# Patient Record
Sex: Female | Born: 1980
Health system: Southern US, Community
[De-identification: ages and names within clinical notes are randomized; demographics above are authoritative.]

## PROBLEM LIST (undated history)

## (undated) DIAGNOSIS — F32A Depression, unspecified: Secondary | ICD-10-CM

## (undated) DIAGNOSIS — H8109 Meniere's disease, unspecified ear: Secondary | ICD-10-CM

## (undated) DIAGNOSIS — Z9889 Other specified postprocedural states: Secondary | ICD-10-CM

## (undated) DIAGNOSIS — M0800 Unspecified juvenile rheumatoid arthritis of unspecified site: Secondary | ICD-10-CM

## (undated) DIAGNOSIS — R0789 Other chest pain: Secondary | ICD-10-CM

## (undated) DIAGNOSIS — G43909 Migraine, unspecified, not intractable, without status migrainosus: Secondary | ICD-10-CM

## (undated) DIAGNOSIS — R112 Nausea with vomiting, unspecified: Secondary | ICD-10-CM

## (undated) DIAGNOSIS — I493 Ventricular premature depolarization: Secondary | ICD-10-CM

## (undated) DIAGNOSIS — F419 Anxiety disorder, unspecified: Secondary | ICD-10-CM

## (undated) DIAGNOSIS — G629 Polyneuropathy, unspecified: Secondary | ICD-10-CM

## (undated) DIAGNOSIS — R0609 Other forms of dyspnea: Secondary | ICD-10-CM

## (undated) DIAGNOSIS — M1990 Unspecified osteoarthritis, unspecified site: Secondary | ICD-10-CM

## (undated) DIAGNOSIS — F329 Major depressive disorder, single episode, unspecified: Secondary | ICD-10-CM

## (undated) DIAGNOSIS — R0600 Dyspnea, unspecified: Secondary | ICD-10-CM

## (undated) DIAGNOSIS — R002 Palpitations: Secondary | ICD-10-CM

## (undated) HISTORY — DX: Other chest pain: R07.89

## (undated) HISTORY — DX: Polyneuropathy, unspecified: G62.9

## (undated) HISTORY — DX: Dyspnea, unspecified: R06.00

## (undated) HISTORY — DX: Depression, unspecified: F32.A

## (undated) HISTORY — DX: Unspecified juvenile rheumatoid arthritis of unspecified site: M08.00

## (undated) HISTORY — DX: Palpitations: R00.2

## (undated) HISTORY — DX: Other forms of dyspnea: R06.09

## (undated) HISTORY — DX: Ventricular premature depolarization: I49.3

## (undated) HISTORY — PX: REFRACTIVE SURGERY: SHX103

## (undated) HISTORY — DX: Unspecified osteoarthritis, unspecified site: M19.90

## (undated) HISTORY — DX: Major depressive disorder, single episode, unspecified: F32.9

## (undated) HISTORY — PX: KNEE SURGERY: SHX244

---

## 2015-03-05 ENCOUNTER — Ambulatory Visit: Payer: Self-pay | Admitting: Physician Assistant

## 2015-03-05 ENCOUNTER — Encounter: Payer: Self-pay | Admitting: Physician Assistant

## 2015-03-05 ENCOUNTER — Ambulatory Visit: Payer: Self-pay | Admitting: Family

## 2015-03-05 VITALS — BP 110/80 | HR 60 | Temp 98.0°F

## 2015-03-05 DIAGNOSIS — J302 Other seasonal allergic rhinitis: Secondary | ICD-10-CM

## 2015-03-05 DIAGNOSIS — Z9109 Other allergy status, other than to drugs and biological substances: Secondary | ICD-10-CM

## 2015-03-05 DIAGNOSIS — J4520 Mild intermittent asthma, uncomplicated: Secondary | ICD-10-CM

## 2015-03-05 MED ORDER — ALBUTEROL SULFATE HFA 108 (90 BASE) MCG/ACT IN AERS: 1.0000 | INHALATION_SPRAY | Freq: Four times a day (QID) | RESPIRATORY_TRACT | Status: DC | PRN

## 2015-03-05 NOTE — Progress Notes (Signed)
S/ tightness of chest during jogging 3  Miles  over the past 3 months, had bronchitis a few months  Ago  And denies further sxs . H/o seasonal environmental  Allergies,  not taking anything O/ pleasant NAD VSS Ent with allergic changes  Heart rsr Lungs clear  A/ RAD exercise induced P/ Rx albuterol inhaler for pre exercise . Advised to take otc claritin and f/u prn not improving.  Web Md allergy app .

## 2015-07-14 ENCOUNTER — Ambulatory Visit: Payer: Self-pay | Admitting: Physician Assistant

## 2015-07-14 ENCOUNTER — Encounter: Payer: Self-pay | Admitting: Physician Assistant

## 2015-07-14 VITALS — BP 105/65 | HR 69 | Temp 98.3°F

## 2015-07-14 DIAGNOSIS — J018 Other acute sinusitis: Secondary | ICD-10-CM

## 2015-07-14 MED ORDER — AZITHROMYCIN 250 MG PO TABS: ORAL_TABLET | ORAL | Status: AC

## 2015-07-14 MED ORDER — FLUTICASONE PROPIONATE 50 MCG/ACT NA SUSP: 2.0000 | Freq: Every day | NASAL | Status: DC

## 2015-07-14 NOTE — Progress Notes (Signed)
S: C/o runny nose and congestion for 5 days, low grade fever,  No chills, cp/sob, v/d; mucus is green and thick, cough is sporadic, c/o of facial and dental pain.   Using otc meds: allergy med  O: PE: vitals wnl, nad, perrl eomi, normocephalic, tms dull, nasal mucosa red and swollen, throat injected, neck supple no lymph, lungs c t a, cv rrr, neuro intact  A:  Acute sinusitis   P: zpack, flonase; drink fluids, continue regular meds , use otc meds of choice, return if not improving in 5 days, return earlier if worsening

## 2015-07-16 ENCOUNTER — Emergency Department
Admission: EM | Admit: 2015-07-16 | Discharge: 2015-07-16 | Disposition: A | Discharge status: Home / Self Care | Payer: BLUE CROSS/BLUE SHIELD | Attending: Emergency Medicine | Admitting: Emergency Medicine

## 2015-07-16 ENCOUNTER — Encounter: Payer: Self-pay | Admitting: Emergency Medicine

## 2015-07-16 ENCOUNTER — Emergency Department: Payer: BLUE CROSS/BLUE SHIELD

## 2015-07-16 DIAGNOSIS — G43909 Migraine, unspecified, not intractable, without status migrainosus: Secondary | ICD-10-CM | POA: Diagnosis not present

## 2015-07-16 DIAGNOSIS — R251 Tremor, unspecified: Secondary | ICD-10-CM | POA: Diagnosis present

## 2015-07-16 DIAGNOSIS — T363X5A Adverse effect of macrolides, initial encounter: Secondary | ICD-10-CM | POA: Diagnosis not present

## 2015-07-16 DIAGNOSIS — G2571 Drug induced akathisia: Secondary | ICD-10-CM | POA: Diagnosis not present

## 2015-07-16 HISTORY — DX: Meniere's disease, unspecified ear: H81.09

## 2015-07-16 HISTORY — DX: Migraine, unspecified, not intractable, without status migrainosus: G43.909

## 2015-07-16 LAB — URINE DRUG SCREEN, QUALITATIVE (ARMC ONLY)
Amphetamines, Ur Screen: NOT DETECTED
BARBITURATES, UR SCREEN: NOT DETECTED
BENZODIAZEPINE, UR SCRN: NOT DETECTED
CANNABINOID 50 NG, UR ~~LOC~~: NOT DETECTED
COCAINE METABOLITE, UR ~~LOC~~: NOT DETECTED
MDMA (Ecstasy)Ur Screen: NOT DETECTED
METHADONE SCREEN, URINE: NOT DETECTED
OPIATE, UR SCREEN: NOT DETECTED
Phencyclidine (PCP) Ur S: NOT DETECTED
TRICYCLIC, UR SCREEN: NOT DETECTED

## 2015-07-16 LAB — COMPREHENSIVE METABOLIC PANEL
ALBUMIN: 4.6 g/dL (ref 3.5–5.0)
ALK PHOS: 56 U/L (ref 38–126)
ALT: 24 U/L (ref 14–54)
AST: 27 U/L (ref 15–41)
Anion gap: 5 (ref 5–15)
BUN: 20 mg/dL (ref 6–20)
CALCIUM: 9.3 mg/dL (ref 8.9–10.3)
CHLORIDE: 111 mmol/L (ref 101–111)
CO2: 24 mmol/L (ref 22–32)
CREATININE: 0.83 mg/dL (ref 0.44–1.00)
GFR calc Af Amer: >60 mL/min (ref >60)
GFR calc non Af Amer: >60 mL/min (ref >60)
GLUCOSE: 95 mg/dL (ref 65–99)
Potassium: 4.6 mmol/L (ref 3.5–5.1)
SODIUM: 140 mmol/L (ref 135–145)
Total Bilirubin: 0.2 mg/dL — ABNORMAL LOW (ref 0.3–1.2)
Total Protein: 7.7 g/dL (ref 6.5–8.1)

## 2015-07-16 LAB — URINALYSIS COMPLETE WITH MICROSCOPIC (ARMC ONLY)
BILIRUBIN URINE: NEGATIVE
GLUCOSE, UA: NEGATIVE mg/dL
Ketones, ur: NEGATIVE mg/dL
Nitrite: NEGATIVE
Protein, ur: NEGATIVE mg/dL
RBC / HPF: NONE SEEN RBC/hpf (ref 0–5)
Specific Gravity, Urine: 1.004 — ABNORMAL LOW (ref 1.005–1.030)
pH: 7 (ref 5.0–8.0)

## 2015-07-16 LAB — CBC WITH DIFFERENTIAL/PLATELET
BASOS PCT: 1 %
Basophils Absolute: 0 10*3/uL (ref 0–0.1)
EOS ABS: 0 10*3/uL (ref 0–0.7)
Eosinophils Relative: 1 %
HEMATOCRIT: 41.4 % (ref 35.0–47.0)
HEMOGLOBIN: 13.9 g/dL (ref 12.0–16.0)
LYMPHS ABS: 1.8 10*3/uL (ref 1.0–3.6)
Lymphocytes Relative: 23 %
MCH: 30.1 pg (ref 26.0–34.0)
MCHC: 33.6 g/dL (ref 32.0–36.0)
MCV: 89.6 fL (ref 80.0–100.0)
MONO ABS: 0.7 10*3/uL (ref 0.2–0.9)
MONOS PCT: 9 %
Neutro Abs: 5.2 10*3/uL (ref 1.4–6.5)
Neutrophils Relative %: 66 %
Platelets: 225 10*3/uL (ref 150–440)
RBC: 4.62 MIL/uL (ref 3.80–5.20)
RDW: 14 % (ref 11.5–14.5)
WBC: 7.9 10*3/uL (ref 3.6–11.0)

## 2015-07-16 LAB — TSH: TSH: 1.829 u[IU]/mL (ref 0.350–4.500)

## 2015-07-16 MED ORDER — DIPHENHYDRAMINE HCL 50 MG/ML IJ SOLN
12.5000 mg | Freq: Once | INTRAMUSCULAR | Status: AC
  Administered 2015-07-16: 12.5 mg via INTRAVENOUS

## 2015-07-16 MED ORDER — IBUPROFEN 800 MG PO TABS
ORAL_TABLET | ORAL | Status: AC
  Administered 2015-07-16: 800 mg via ORAL
  Filled 2015-07-16: qty 1

## 2015-07-16 MED ORDER — IBUPROFEN 800 MG PO TABS
800.0000 mg | ORAL_TABLET | Freq: Once | ORAL | Status: AC
  Administered 2015-07-16: 800 mg via ORAL

## 2015-07-16 MED ORDER — LORAZEPAM 2 MG/ML IJ SOLN
1.0000 mg | Freq: Once | INTRAMUSCULAR | Status: AC
  Administered 2015-07-16: 1 mg via INTRAVENOUS

## 2015-07-16 MED ORDER — LORAZEPAM 2 MG/ML IJ SOLN
0.5000 mg | Freq: Once | INTRAMUSCULAR | Status: AC
  Administered 2015-07-16: 0.5 mg via INTRAVENOUS

## 2015-07-16 MED ORDER — LORAZEPAM 2 MG/ML IJ SOLN
INTRAMUSCULAR | Status: AC
Start: 2015-07-16 — End: 2015-07-16
  Administered 2015-07-16: 0.5 mg via INTRAVENOUS
  Filled 2015-07-16: qty 1

## 2015-07-16 MED ORDER — LORAZEPAM 0.5 MG PO TABS: 0.5000 mg | ORAL_TABLET | Freq: Three times a day (TID) | ORAL | Status: AC | PRN

## 2015-07-16 MED ORDER — LORAZEPAM 2 MG/ML IJ SOLN
INTRAMUSCULAR | Status: AC
  Administered 2015-07-16: 1 mg via INTRAVENOUS
  Filled 2015-07-16: qty 1

## 2015-07-16 MED ORDER — DIPHENHYDRAMINE HCL 50 MG/ML IJ SOLN
INTRAMUSCULAR | Status: AC
  Administered 2015-07-16: 12.5 mg via INTRAVENOUS
  Filled 2015-07-16: qty 1

## 2015-07-16 NOTE — ED Notes (Signed)
Roshonna called back from Poison control center and states that she spoke with their MD and they can not find any link between azithromycin and patients symptom. Dr. Jacqualine Code informed.

## 2015-07-16 NOTE — Discharge Instructions (Signed)
Akathisia/Dystonia  No driving today or while using ativan. Follow up closely with neurology. Return to the emergency room if your symptoms recur, become severe, develop any numbness, weakness in the face arms or legs, severe headache, fever, or other new concerns arise.  Discontinue azithromycin, you may have an allergy to this medicine.  Dystonia is a condition that makes your muscles contract without warning (muscle spasms). It can make doing everyday tasks hard. There are different forms of dystonia. The condition can affect just one part of your body, or it can affect larger areas of your body. Dystonia affects people in different ways. In some people, it is mild and goes away over time, while others may need treatment. Although there is no cure for dystonia, you can manage the condition with treatment. CAUSES  Dystonia may be caused by:  Genetics. This means you inherited the genes that cause you to be at risk for dystonia.  An abnormality in the part of your brain that controls movement (basal ganglia). Dystonia may also be acquired. If you have acquired dystonia, you developed the condition after:  Brain injury.  Infection.  Drug reaction. The cause of dystonia may also not be known (idiopathic dystonia).  SIGNS AND SYMPTOMS Signs and symptoms of dystonia can depend on which type of the condition you have. Common signs and symptoms include:  Muscle twitches or spasms around your eyes (blepharospasm).  Foot cramping or dragging.  Pulling of your neck to one side (torticollis).  Muscles spasms of the face.  Spasms of the voice box.  Tremors.  Awkward and painful positions.  Muscle cramping after muscle use. DIAGNOSIS  Your health care provider can diagnose dystonia based on your symptoms and medical history. Your health care provider will also do a physical exam. You may also have:   A blood test to check for genes that cause dystonia.  Brain imaging tests to rule  out other causes of your symptoms. There are no tests that can diagnose other causes of dystonia. TREATMENT  There are no treatments that can cure or prevent dystonia. Treatment to manage dystonia may include:   Injecting the affected muscles with a chemical (botulinum) that blocks muscle spasms. This treatment can block spasms for a few days to a few months.  Medicines to relax muscles. HOME CARE INSTRUCTIONS  Physical therapy to improve muscle strength and movement may be suggested by your health care provider. Continue your physical therapy exercises at home as instructed by your physical therapist.  Make sure you have a good support system. Let your health care provider know if you are struggling with stress or anxiety.  Keep all follow-up visits as directed by your health care provider. This is important.  Take medicines only as directed by your health care provider. SEEK MEDICAL CARE IF:  Your condition is changing or getting worse.  You need more support at home.   This information is not intended to replace advice given to you by your health care provider. Make sure you discuss any questions you have with your health care provider.   Document Released: 03/10/2002 Document Revised: 04/10/2014 Document Reviewed: 05/14/2013 Elsevier Interactive Patient Education [employer].

## 2015-07-16 NOTE — ED Notes (Signed)
Patient presents to the ED with tremors, mostly on the right side of her body that began with mostly her hand and now includes her leg.  Patient is also complaining of migraine headache which she has a history of.  Patient also recently started taking a z-pack for a sinus infection.  Patient reports history of Meniere's disease as well.  Patient is alert and oriented at this time.

## 2015-07-16 NOTE — ED Provider Notes (Signed)
[facility] Emergency Department Provider Note  ____________________________________________  Time seen: Approximately 9:28 AM  I have reviewed the triage vital signs and the nursing notes.   HISTORY  Chief Complaint Tremors    HPI Sarah Gallagher is a 35 y.o. female previous history of Mnire's, migraines, and recent 3 day treatment with azithromycin for a "sinus infection" which is getting better.  Patient reports that this morning about 4 morning she woke up and started feeling slightly tremulous in the right arm. This tremor progressed and she being tremulous in the right arm, right leg, left arm and also occasionally in the neck. She reports she just feels shaky as though she has a "Parkinson's" symptoms. She does not of course carry a history of Parkinson's. No previous history of tremor.  No pain. No numbness tingling or weakness in the arm or leg. No trouble speaking. No facial droop.  She felt fine when she went to bed last night. Tremor first noted when she woke up around 4 in the morning.  Past Medical History  Diagnosis Date  . Meniere disease   . Migraines     There are no active problems to display for this patient.   Past Surgical History  Procedure Laterality Date  . Knee surgery      Current Outpatient Rx  Name  Route  Sig  Dispense  Refill  . albuterol (PROVENTIL HFA;VENTOLIN HFA) 108 (90 BASE) MCG/ACT inhaler   Inhalation   Inhale 1-2 puffs into the lungs every 6 (six) hours as needed for wheezing or shortness of breath.   1 Inhaler   2   . azithromycin (ZITHROMAX Z-PAK) 250 MG tablet      2 pills today then 1 pill a day for 4 days   6 each   0   . fluticasone (FLONASE) 50 MCG/ACT nasal spray   Each Nare   Place 2 sprays into both nostrils daily.   16 g   6   . medroxyPROGESTERone (DEPO-PROVERA) 150 MG/ML injection   Intramuscular   Inject 150 mg into the muscle every 3 (three) months.         Marland Kitchen LORazepam  (ATIVAN) 0.5 MG tablet   Oral   Take 1 tablet (0.5 mg total) by mouth every 8 (eight) hours as needed (tremor).   16 tablet   0     Allergies Review of patient's allergies indicates no known allergies.  No family history on file.  Social History Social History  Substance Use Topics  . Smoking status: Never Smoker   . Smokeless tobacco: None  . Alcohol Use: 0.0 oz/week    0 Standard drinks or equivalent per week     Comment: occasionally    Review of Systems Constitutional: No fever/chills, though did have a recent sinus infection which is improving Eyes: No visual changes. ENT: No sore throat. Cardiovascular: Denies chest pain. Respiratory: Denies shortness of breath. Gastrointestinal: No abdominal pain.  No nausea, no vomiting.  No diarrhea.  No constipation. Genitourinary: Negative for dysuria. Musculoskeletal: Negative for back pain. Skin: Negative for rash. Neurological: Negative for headaches, focal weakness or numbness.  10-point ROS otherwise negative.  ____________________________________________   PHYSICAL EXAM:  VITAL SIGNS: ED Triage Vitals  Enc Vitals Group     BP 07/16/15 0806 133/88 mmHg     Pulse Rate 07/16/15 0806 80     Resp 07/16/15 0806 18     Temp 07/16/15 0806 98.1 F (36.7 C)  Temp Source 07/16/15 0806 Oral     SpO2 07/16/15 0806 100 %     Weight 07/16/15 0801 155 lb (70.308 kg)     Height 07/16/15 0801 5\' 5"  (1.651 m)     Head Cir --      Peak Flow --      Pain Score 07/16/15 0801 5     Pain Loc --      Pain Edu? --      Excl. in [place]? --    Constitutional: Alert and oriented. Well appearing and in no acute distress.Very pleasant. She is moving back and forth tremulous on the bed, somewhat difficult for her to control her tremor. Eyes: Conjunctivae are normal. PERRL. EOMI. Head: Atraumatic. Nose: No congestion/rhinnorhea. Mouth/Throat: Mucous membranes are moist.  Oropharynx non-erythematous. Neck: No stridor.  No  meningismus. Cardiovascular: Normal rate, regular rhythm. Grossly normal heart sounds.  Good peripheral circulation. Respiratory: Normal respiratory effort.  No retractions. Lungs CTAB. Gastrointestinal: Soft and nontender. No distention. No abdominal bruits. No CVA tenderness. Musculoskeletal: No lower extremity tenderness nor edema.  No joint effusions. Neurologic:  Normal speech and language. No gross focal neurologic deficits are appreciated.   NIH score equals 0, performed by me at bedside. The patient has no pronator drift. The patient has normal cranial nerve exam. Extraocular movements are normal. Visual fields are normal. Patient has 5 out of 5 strength in all extremities. There is no numbness or gross, acute sensory abnormality in the extremities bilaterally. No speech disturbance. No dysarthria. No aphasia. No ataxia except she does have bilateral tremulousness with hand movement. Normal finger nose finger bilat. Patient speaking in full and clear sentences.  The patient does have some slight writhing, tremulous movements. He is to be somewhat like akathisia's.   Skin:  Skin is warm, dry and intact. No rash noted. Psychiatric: Mood and affect are normal. Speech and behavior are normal.  ____________________________________________   LABS (all labs ordered are listed, but only abnormal results are displayed)  Labs Reviewed  COMPREHENSIVE METABOLIC PANEL - Abnormal; Notable for the following:    Total Bilirubin 0.2 (*)    All other components within normal limits  CBC WITH DIFFERENTIAL/PLATELET  TSH  URINE DRUG SCREEN, QUALITATIVE ([facility] ONLY)  URINALYSIS COMPLETEWITH MICROSCOPIC ([facility] ONLY)   ____________________________________________  EKG  ED ECG REPORT I, QUALE, MARK, the attending physician, personally viewed and interpreted this ECG.  Date: 07/16/2015 EKG Time: 833 Rate: 70 Rhythm: normal sinus rhythm QRS Axis: normal Intervals: normal ST/T Wave  abnormalities: normal Conduction Disturbances: none Narrative Interpretation: unremarkable, of particular relevance the QTC is normal  ____________________________________________  RADIOLOGY  CT Head Wo Contrast (Final result) Result time: 07/16/15 08:29:21   Final result by Rad Results In Interface (07/16/15 08:29:21)   Narrative:   CLINICAL DATA: Tremors and migraine  EXAM: CT HEAD WITHOUT CONTRAST  TECHNIQUE: Contiguous axial images were obtained from the base of the skull through the vertex without intravenous contrast.  COMPARISON: None.  FINDINGS: Skull and Sinuses:Negative for fracture or destructive process. The visualized mastoids, middle ears, and imaged paranasal sinuses are clear.  Visualized orbits: Negative.  Brain: No evidence of acute infarction, hemorrhage, hydrocephalus, or mass lesion/mass effect. Low-density below the right putamen is most compatible with dilated perivascular space.  IMPRESSION: Negative head CT.   Electronically Signed By: Monte Fantasia M.D. On: 07/16/2015 08:29       ____________________________________________   PROCEDURES  Procedure(s) performed: None  Critical Care performed: No  ____________________________________________  INITIAL IMPRESSION / ASSESSMENT AND PLAN / ED COURSE  Pertinent labs & imaging results that were available during my care of the patient were reviewed by me and considered in my medical decision making (see chart for details).  Patient presents with apparent akathisia's. No previous history of such. She does not exhibit any distress, but does have what is tremor and some writhing type movements.  She is neurologically intact without deficit. Head CT is normal. She has not high risk for stroke. In particular, in reviewing her medications she did recently start azithromycin and a possible rare side effect is akathisia. We will treat her with benzodiazepine and obtain neurology  consult for further input.  ----------------------------------------- 11:03 AM on 07/16/2015 -----------------------------------------  Sseen by neurology, significant agreement of the stove akathisia may be attributed to her azithromycin. I did discuss with poison control who advised that this is possible it is not of a recommended further workup which is been performed including CT that basic labs.  At the present time, she is resting comfortably in her tremors have disappeared. She has no ongoing tremor. She is awake alert in no distress with normal neurologic examination and alertness. We will treat her with as needed Ativan, discontinue azithromycin, provided careful return precautions as well as follow-up neurology. Patient very agreeable. Her husband is coming to pick her up to drive her home.   ____________________________________________   FINAL CLINICAL IMPRESSION(S) / ED DIAGNOSES  Final diagnoses:  Drug induced akathisia      Delman Kitten, MD 07/16/15 1104

## 2015-07-16 NOTE — ED Notes (Addendum)
Patient states that on Wednesday she started taking a z-pak for respiratory symptoms. This morning she woke up around 4 am and felt weak and around 5 am she started having tremors. Patient also c/o a migraine but states that this is not unusual for her.

## 2015-07-16 NOTE — ED Notes (Signed)
Patient currently being evaluated by [facility] neurologist

## 2015-07-16 NOTE — ED Notes (Signed)
Spoke with R[place]onna at [facility] control center about patients symptoms. Geryl Councilman will check with their MD and call us back.

## 2016-01-04 DIAGNOSIS — Z113 Encounter for screening for infections with a predominantly sexual mode of transmission: Secondary | ICD-10-CM | POA: Diagnosis not present

## 2016-01-04 DIAGNOSIS — Z6824 Body mass index (BMI) 24.0-24.9, adult: Secondary | ICD-10-CM | POA: Diagnosis not present

## 2016-01-04 DIAGNOSIS — Z01419 Encounter for gynecological examination (general) (routine) without abnormal findings: Secondary | ICD-10-CM | POA: Diagnosis not present

## 2016-01-05 DIAGNOSIS — Z01419 Encounter for gynecological examination (general) (routine) without abnormal findings: Secondary | ICD-10-CM | POA: Diagnosis not present

## 2016-03-22 ENCOUNTER — Ambulatory Visit: Payer: Self-pay | Admitting: Physician Assistant

## 2016-06-08 DIAGNOSIS — M530 Cervicocranial syndrome: Secondary | ICD-10-CM | POA: Diagnosis not present

## 2016-06-08 DIAGNOSIS — M6080 Other myositis, unspecified site: Secondary | ICD-10-CM | POA: Diagnosis not present

## 2016-06-08 DIAGNOSIS — M9902 Segmental and somatic dysfunction of thoracic region: Secondary | ICD-10-CM | POA: Diagnosis not present

## 2016-06-08 DIAGNOSIS — M9901 Segmental and somatic dysfunction of cervical region: Secondary | ICD-10-CM | POA: Diagnosis not present

## 2016-06-08 DIAGNOSIS — M5136 Other intervertebral disc degeneration, lumbar region: Secondary | ICD-10-CM | POA: Diagnosis not present

## 2016-06-08 DIAGNOSIS — M9903 Segmental and somatic dysfunction of lumbar region: Secondary | ICD-10-CM | POA: Diagnosis not present

## 2016-06-08 DIAGNOSIS — M531 Cervicobrachial syndrome: Secondary | ICD-10-CM | POA: Diagnosis not present

## 2016-06-08 DIAGNOSIS — M546 Pain in thoracic spine: Secondary | ICD-10-CM | POA: Diagnosis not present

## 2016-06-08 DIAGNOSIS — M62830 Muscle spasm of back: Secondary | ICD-10-CM | POA: Diagnosis not present

## 2016-06-14 DIAGNOSIS — M530 Cervicocranial syndrome: Secondary | ICD-10-CM | POA: Diagnosis not present

## 2016-06-14 DIAGNOSIS — M9902 Segmental and somatic dysfunction of thoracic region: Secondary | ICD-10-CM | POA: Diagnosis not present

## 2016-06-14 DIAGNOSIS — M531 Cervicobrachial syndrome: Secondary | ICD-10-CM | POA: Diagnosis not present

## 2016-06-14 DIAGNOSIS — M6080 Other myositis, unspecified site: Secondary | ICD-10-CM | POA: Diagnosis not present

## 2016-06-14 DIAGNOSIS — M62830 Muscle spasm of back: Secondary | ICD-10-CM | POA: Diagnosis not present

## 2016-06-14 DIAGNOSIS — M9903 Segmental and somatic dysfunction of lumbar region: Secondary | ICD-10-CM | POA: Diagnosis not present

## 2016-06-14 DIAGNOSIS — M546 Pain in thoracic spine: Secondary | ICD-10-CM | POA: Diagnosis not present

## 2016-06-14 DIAGNOSIS — M9901 Segmental and somatic dysfunction of cervical region: Secondary | ICD-10-CM | POA: Diagnosis not present

## 2016-06-14 DIAGNOSIS — M5136 Other intervertebral disc degeneration, lumbar region: Secondary | ICD-10-CM | POA: Diagnosis not present

## 2016-06-20 DIAGNOSIS — M9903 Segmental and somatic dysfunction of lumbar region: Secondary | ICD-10-CM | POA: Diagnosis not present

## 2016-06-20 DIAGNOSIS — M6080 Other myositis, unspecified site: Secondary | ICD-10-CM | POA: Diagnosis not present

## 2016-06-20 DIAGNOSIS — M546 Pain in thoracic spine: Secondary | ICD-10-CM | POA: Diagnosis not present

## 2016-06-20 DIAGNOSIS — M531 Cervicobrachial syndrome: Secondary | ICD-10-CM | POA: Diagnosis not present

## 2016-06-20 DIAGNOSIS — M5136 Other intervertebral disc degeneration, lumbar region: Secondary | ICD-10-CM | POA: Diagnosis not present

## 2016-06-20 DIAGNOSIS — M530 Cervicocranial syndrome: Secondary | ICD-10-CM | POA: Diagnosis not present

## 2016-06-20 DIAGNOSIS — M9902 Segmental and somatic dysfunction of thoracic region: Secondary | ICD-10-CM | POA: Diagnosis not present

## 2016-06-20 DIAGNOSIS — M9901 Segmental and somatic dysfunction of cervical region: Secondary | ICD-10-CM | POA: Diagnosis not present

## 2016-06-20 DIAGNOSIS — M62830 Muscle spasm of back: Secondary | ICD-10-CM | POA: Diagnosis not present

## 2016-06-22 DIAGNOSIS — M62830 Muscle spasm of back: Secondary | ICD-10-CM | POA: Diagnosis not present

## 2016-06-22 DIAGNOSIS — M530 Cervicocranial syndrome: Secondary | ICD-10-CM | POA: Diagnosis not present

## 2016-06-22 DIAGNOSIS — M5136 Other intervertebral disc degeneration, lumbar region: Secondary | ICD-10-CM | POA: Diagnosis not present

## 2016-06-22 DIAGNOSIS — M6080 Other myositis, unspecified site: Secondary | ICD-10-CM | POA: Diagnosis not present

## 2016-06-22 DIAGNOSIS — M546 Pain in thoracic spine: Secondary | ICD-10-CM | POA: Diagnosis not present

## 2016-06-22 DIAGNOSIS — M9903 Segmental and somatic dysfunction of lumbar region: Secondary | ICD-10-CM | POA: Diagnosis not present

## 2016-06-22 DIAGNOSIS — M531 Cervicobrachial syndrome: Secondary | ICD-10-CM | POA: Diagnosis not present

## 2016-06-22 DIAGNOSIS — M9902 Segmental and somatic dysfunction of thoracic region: Secondary | ICD-10-CM | POA: Diagnosis not present

## 2016-06-22 DIAGNOSIS — M9901 Segmental and somatic dysfunction of cervical region: Secondary | ICD-10-CM | POA: Diagnosis not present

## 2016-08-14 ENCOUNTER — Encounter: Payer: Self-pay | Admitting: Physician Assistant

## 2016-08-14 ENCOUNTER — Ambulatory Visit: Payer: Self-pay | Admitting: Physician Assistant

## 2016-08-14 VITALS — BP 108/70 | HR 74 | Temp 98.2°F

## 2016-08-14 DIAGNOSIS — M549 Dorsalgia, unspecified: Secondary | ICD-10-CM

## 2016-08-14 MED ORDER — BACLOFEN 10 MG PO TABS: 10.0000 mg | ORAL_TABLET | Freq: Three times a day (TID) | ORAL | 0 refills | Status: DC

## 2016-08-14 MED ORDER — PREDNISONE 10 MG (21) PO TBPK: ORAL_TABLET | ORAL | 0 refills | Status: DC

## 2016-08-14 NOTE — Progress Notes (Signed)
S: pt c/o mid upper back pain, pain radiates through to chest, is worse with movement, was seen at chiropractor a while back and was told her spine is "twisted" ; did workout on Friday and did back and shoulders, increased pain on Saturday, severe pain on Sunday, did an ekg at work and it was normal, no diaphoresis, sob, no long distance travel, nonsmoker  O: vitals wnl, nad, lungs c t a, cv rrr, c spine in not tender, t spine is a little tender, left shoulder muscles are spasmed, pain reproduced with movement, worse when pt tried to lie supine, grips = b/l, n/v intact  A: acute upper back pain  P: sterapred, baclofen, go to ER if worsening

## 2016-10-23 ENCOUNTER — Ambulatory Visit
Admission: RE | Admit: 2016-10-23 | Discharge: 2016-10-23 | Disposition: A | Discharge status: Home / Self Care | Payer: 59 | Source: Ambulatory Visit | Attending: Physician Assistant | Admitting: Physician Assistant

## 2016-10-23 ENCOUNTER — Ambulatory Visit: Payer: Self-pay | Admitting: Physician Assistant

## 2016-10-23 VITALS — BP 100/80 | HR 79 | Temp 98.5°F

## 2016-10-23 DIAGNOSIS — M79671 Pain in right foot: Secondary | ICD-10-CM | POA: Diagnosis not present

## 2016-10-23 NOTE — Progress Notes (Signed)
S: c/o r foot pain for over a month, always hurts when she bears weight, no known injury, worried about a stress fracture  O: vitals wnl, nad, skin intact, foot is tender along 5th metatarsal, full rom, n/ vintact  A: acute foot pain  P: xray r foot

## 2016-11-10 DIAGNOSIS — M659 Synovitis and tenosynovitis, unspecified: Secondary | ICD-10-CM | POA: Diagnosis not present

## 2016-11-16 DIAGNOSIS — M9902 Segmental and somatic dysfunction of thoracic region: Secondary | ICD-10-CM | POA: Diagnosis not present

## 2016-11-16 DIAGNOSIS — M530 Cervicocranial syndrome: Secondary | ICD-10-CM | POA: Diagnosis not present

## 2016-11-16 DIAGNOSIS — M5136 Other intervertebral disc degeneration, lumbar region: Secondary | ICD-10-CM | POA: Diagnosis not present

## 2016-11-16 DIAGNOSIS — M9901 Segmental and somatic dysfunction of cervical region: Secondary | ICD-10-CM | POA: Diagnosis not present

## 2016-11-16 DIAGNOSIS — M6080 Other myositis, unspecified site: Secondary | ICD-10-CM | POA: Diagnosis not present

## 2016-11-16 DIAGNOSIS — M9903 Segmental and somatic dysfunction of lumbar region: Secondary | ICD-10-CM | POA: Diagnosis not present

## 2016-11-16 DIAGNOSIS — M546 Pain in thoracic spine: Secondary | ICD-10-CM | POA: Diagnosis not present

## 2016-11-16 DIAGNOSIS — M531 Cervicobrachial syndrome: Secondary | ICD-10-CM | POA: Diagnosis not present

## 2016-11-16 DIAGNOSIS — M62830 Muscle spasm of back: Secondary | ICD-10-CM | POA: Diagnosis not present

## 2016-11-21 DIAGNOSIS — M546 Pain in thoracic spine: Secondary | ICD-10-CM | POA: Diagnosis not present

## 2016-11-21 DIAGNOSIS — M9901 Segmental and somatic dysfunction of cervical region: Secondary | ICD-10-CM | POA: Diagnosis not present

## 2016-11-21 DIAGNOSIS — M9903 Segmental and somatic dysfunction of lumbar region: Secondary | ICD-10-CM | POA: Diagnosis not present

## 2016-11-21 DIAGNOSIS — M5136 Other intervertebral disc degeneration, lumbar region: Secondary | ICD-10-CM | POA: Diagnosis not present

## 2016-11-21 DIAGNOSIS — M531 Cervicobrachial syndrome: Secondary | ICD-10-CM | POA: Diagnosis not present

## 2016-11-21 DIAGNOSIS — M530 Cervicocranial syndrome: Secondary | ICD-10-CM | POA: Diagnosis not present

## 2016-11-21 DIAGNOSIS — M62830 Muscle spasm of back: Secondary | ICD-10-CM | POA: Diagnosis not present

## 2016-11-21 DIAGNOSIS — M6080 Other myositis, unspecified site: Secondary | ICD-10-CM | POA: Diagnosis not present

## 2016-11-21 DIAGNOSIS — M9902 Segmental and somatic dysfunction of thoracic region: Secondary | ICD-10-CM | POA: Diagnosis not present

## 2016-11-27 DIAGNOSIS — M9903 Segmental and somatic dysfunction of lumbar region: Secondary | ICD-10-CM | POA: Diagnosis not present

## 2016-11-27 DIAGNOSIS — M531 Cervicobrachial syndrome: Secondary | ICD-10-CM | POA: Diagnosis not present

## 2016-11-27 DIAGNOSIS — M6080 Other myositis, unspecified site: Secondary | ICD-10-CM | POA: Diagnosis not present

## 2016-11-27 DIAGNOSIS — M530 Cervicocranial syndrome: Secondary | ICD-10-CM | POA: Diagnosis not present

## 2016-11-27 DIAGNOSIS — M62830 Muscle spasm of back: Secondary | ICD-10-CM | POA: Diagnosis not present

## 2016-11-27 DIAGNOSIS — M9901 Segmental and somatic dysfunction of cervical region: Secondary | ICD-10-CM | POA: Diagnosis not present

## 2016-11-27 DIAGNOSIS — M9902 Segmental and somatic dysfunction of thoracic region: Secondary | ICD-10-CM | POA: Diagnosis not present

## 2016-11-27 DIAGNOSIS — M5136 Other intervertebral disc degeneration, lumbar region: Secondary | ICD-10-CM | POA: Diagnosis not present

## 2016-11-27 DIAGNOSIS — M546 Pain in thoracic spine: Secondary | ICD-10-CM | POA: Diagnosis not present

## 2016-12-18 ENCOUNTER — Encounter: Payer: Self-pay | Admitting: Family Medicine

## 2016-12-18 ENCOUNTER — Ambulatory Visit (INDEPENDENT_AMBULATORY_CARE_PROVIDER_SITE_OTHER): Payer: 59 | Admitting: Family Medicine

## 2016-12-18 VITALS — BP 114/68 | HR 83 | Temp 98.4°F | Ht 66.0 in | Wt 171.0 lb

## 2016-12-18 DIAGNOSIS — R0609 Other forms of dyspnea: Secondary | ICD-10-CM | POA: Diagnosis not present

## 2016-12-18 DIAGNOSIS — M791 Myalgia, unspecified site: Secondary | ICD-10-CM

## 2016-12-18 DIAGNOSIS — R1030 Lower abdominal pain, unspecified: Secondary | ICD-10-CM | POA: Diagnosis not present

## 2016-12-18 DIAGNOSIS — Z9189 Other specified personal risk factors, not elsewhere classified: Secondary | ICD-10-CM | POA: Insufficient documentation

## 2016-12-18 DIAGNOSIS — R51 Headache: Secondary | ICD-10-CM

## 2016-12-18 DIAGNOSIS — R300 Dysuria: Secondary | ICD-10-CM | POA: Diagnosis not present

## 2016-12-18 DIAGNOSIS — Z578 Occupational exposure to other risk factors: Secondary | ICD-10-CM | POA: Diagnosis not present

## 2016-12-18 DIAGNOSIS — R519 Headache, unspecified: Secondary | ICD-10-CM

## 2016-12-18 DIAGNOSIS — R002 Palpitations: Secondary | ICD-10-CM

## 2016-12-18 DIAGNOSIS — R0600 Dyspnea, unspecified: Secondary | ICD-10-CM | POA: Insufficient documentation

## 2016-12-18 LAB — POCT URINALYSIS DIPSTICK
Bilirubin, UA: NEGATIVE
Blood, UA: NEGATIVE
GLUCOSE UA: NEGATIVE
KETONES UA: NEGATIVE
LEUKOCYTES UA: NEGATIVE
Nitrite, UA: NEGATIVE
PH UA: 6.5 (ref 5.0–8.0)
PROTEIN UA: NEGATIVE
Spec Grav, UA: 1.02 (ref 1.010–1.025)
UROBILINOGEN UA: 0.2 U/dL

## 2016-12-18 LAB — COMPREHENSIVE METABOLIC PANEL
ALK PHOS: 50 U/L (ref 39–117)
ALT: 13 U/L (ref 0–35)
AST: 13 U/L (ref 0–37)
Albumin: 4.4 g/dL (ref 3.5–5.2)
BILIRUBIN TOTAL: 0.4 mg/dL (ref 0.2–1.2)
BUN: 18 mg/dL (ref 6–23)
CALCIUM: 9.7 mg/dL (ref 8.4–10.5)
CO2: 25 meq/L (ref 19–32)
Chloride: 106 mEq/L (ref 96–112)
Creatinine, Ser: 1.06 mg/dL (ref 0.40–1.20)
GFR: 61.76 mL/min (ref >60.00)
Glucose, Bld: 90 mg/dL (ref 70–99)
Potassium: 4.1 mEq/L (ref 3.5–5.1)
Sodium: 140 mEq/L (ref 135–145)
TOTAL PROTEIN: 7.3 g/dL (ref 6.0–8.3)

## 2016-12-18 LAB — CBC WITH DIFFERENTIAL/PLATELET
BASOS ABS: 0 10*3/uL (ref 0.0–0.1)
Basophils Relative: 0.4 % (ref 0.0–3.0)
Eosinophils Absolute: 0.1 10*3/uL (ref 0.0–0.7)
Eosinophils Relative: 1 % (ref 0.0–5.0)
HEMATOCRIT: 42.4 % (ref 36.0–46.0)
HEMOGLOBIN: 14.2 g/dL (ref 12.0–15.0)
LYMPHS PCT: 30 % (ref 12.0–46.0)
Lymphs Abs: 2.2 10*3/uL (ref 0.7–4.0)
MCHC: 33.6 g/dL (ref 30.0–36.0)
MCV: 92.2 fl (ref 78.0–100.0)
MONOS PCT: 7.1 % (ref 3.0–12.0)
Monocytes Absolute: 0.5 10*3/uL (ref 0.1–1.0)
Neutro Abs: 4.5 10*3/uL (ref 1.4–7.7)
Neutrophils Relative %: 61.5 % (ref 43.0–77.0)
Platelets: 242 10*3/uL (ref 150.0–400.0)
RBC: 4.6 Mil/uL (ref 3.87–5.11)
RDW: 13.8 % (ref 11.5–15.5)
WBC: 7.4 10*3/uL (ref 4.0–10.5)

## 2016-12-18 LAB — TSH: TSH: 2.33 u[IU]/mL (ref 0.35–4.50)

## 2016-12-18 LAB — VITAMIN D 25 HYDROXY (VIT D DEFICIENCY, FRACTURES): VITD: 25.26 ng/mL — AB (ref 30.00–100.00)

## 2016-12-18 LAB — MAGNESIUM: Magnesium: 2 mg/dL (ref 1.5–2.5)

## 2016-12-18 LAB — CK: CK TOTAL: 111 U/L (ref 7–177)

## 2016-12-18 LAB — VITAMIN B12: Vitamin B-12: 426 pg/mL (ref 211–911)

## 2016-12-18 LAB — POCT URINE PREGNANCY: PREG TEST UR: NEGATIVE

## 2016-12-18 LAB — SEDIMENTATION RATE: SED RATE: 5 mm/h (ref 0–20)

## 2016-12-18 NOTE — Assessment & Plan Note (Signed)
Potentially PVCs given reported EKGs of PVCs in the past. She's going to do an EKG at her work and send it to [place]. We'll obtain lab work selling below.

## 2016-12-18 NOTE — Assessment & Plan Note (Signed)
Patient with myalgias and arthralgias for some time now. Also has hand tremor and twitching as well. Had similar symptoms over a year ago that was attributed to azithromycin. Reassuring CT scan at that time. We'll check lab work as outlined below. Discussed referral to neurology though she deferred this until her lab work returns.

## 2016-12-18 NOTE — Progress Notes (Signed)
Tommi Rumps, MD Phone: [phone]  Sarah Gallagher is a 36 y.o. female who presents today for new patient visit.  Patient presents with a number of issues. She notes she has been dyspneic on exertion for some time now. About a year ago she was able to run 6 miles with no issues though since then has gotten to the point where she can't climb a flight of stairs without getting short of breath. She notes no chest pain. She does have palpitations which she states are PVCs. 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 She reports she had some of the cardiology PA's look at them and she states she was advised they were PVCs. She notes she can lay in bed and just feel the PVCs. She does have a history of anemia. She notes no bleeding from anywhere. She is on Depo-Provera and does not have a menstrual cycle. She notes the palpitations lasts very briefly. She does have a history of reactive airway disease though only has to use her inhaler when she runs in the cold. She notes chronic headaches with daily headache though also with migraines 3-4 times a month. No numbness or weakness. She does note some memory issues with trouble remembering things. She just feels foggy. She notes no depression or anxiety. She notes her joints and muscles hurt daily. She also reports at times it feels like her whole body will twitch. She notes her hands will tremor. She notes they will seize up and it will be difficult to open them up. She notes it is uncomfortable when this happens though it is not a pain. She had similar symptoms to that a little over a year ago and was evaluated in the emergency room. They determined that her symptoms were due to akathisia related to being on azithromycin. She had a reassuring CT scan at that time. Patient also notes over the last month or so she's had some lower abdominal discomfort.  Notes it feels like it's a cramp and she likens it to when she had round ligament pain during her pregnancy. Every 3 weeks or so recently she'll have dysuria, frequency, and urgency of urine. She'll take Azo and drink water and this will go away on its own. She notes no diarrhea though she does note within 30 minutes of eating she'll have some loose stools. She notes no vaginal discharge. No vaginal bleeding. No blood in her stool. She does note occasional sores in her mouth.  Active Ambulatory Problems    Diagnosis Date Noted  . Dyspnea on exertion 12/18/2016  . Lower abdominal pain 12/18/2016  . Myalgia 12/18/2016  . Palpitations 12/18/2016  . Headache 12/18/2016  . History of occupational exposure to risk factor 12/18/2016   Resolved Ambulatory Problems    Diagnosis Date Noted  . No Resolved Ambulatory Problems   Past Medical History:  Diagnosis Date  . Arthritis   . Depression   . Meniere disease   . Migraines     Family History  Problem Relation Age of Onset  . Diabetes Mother   . Arthritis Father   . Diabetes Maternal Aunt   . Diabetes Maternal Uncle   . Stroke Paternal Aunt   . Arthritis Maternal Grandmother   . Diabetes Maternal Grandmother   . Arthritis Paternal Grandmother     Social History   Social History  . Marital  status: Married    Spouse name: N/A  . Number of children: N/A  . Years of education: N/A   Occupational History  . Not on file.   Social History Main Topics  . Smoking status: Never Smoker  . Smokeless tobacco: Never Used  . Alcohol use 0.0 oz/week     Comment: occasionally  . Drug use: No  . Sexual activity: Not on file   Other Topics Concern  . Not on file   Social History Narrative  . No narrative on file    ROS  General:  Negative for nexplained weight loss, fever Skin: Negative for new or changing mole, sore that won't heal HEENT: Positive for trouble hearing, sores in mouth, negative for trouble seeing, ringing in ears,  hoarseness, change in voice, dysphagia. CV:  Positive for dyspnea, palpitations, Negative for chest pain, edema Resp: Negative for cough, dyspnea, hemoptysis GI: Positive for abdominal pain, Negative for nausea, vomiting, diarrhea, constipation, melena, hematochezia. GU: Positive for dysuria, negative for incontinence, urinary hesitance, hematuria, vaginal or penile discharge, polyuria, sexual difficulty, lumps in testicle or breasts MSK: Positive for muscle cramps or aches, joint pain or swelling Neuro: Positive for headaches, dizziness, negative for weakness, numbness, passing out/fainting Psych: Negative for depression, anxiety, positive for memory problems  Objective  Physical Exam Vitals:   12/18/16 1116  BP: 114/68  Pulse: 83  Temp: 98.4 F (36.9 C)  SpO2: 98%    BP Readings from Last 3 Encounters:  12/18/16 114/68  10/23/16 100/80  08/14/16 108/70   Wt Readings from Last 3 Encounters:  12/18/16 171 lb (77.6 kg)  07/16/15 155 lb (70.3 kg)    Physical Exam  Constitutional: No distress.  HENT:  Head: Normocephalic and atraumatic.  Mouth/Throat: No oropharyngeal exudate.  Possible small ulcer on left side heart palate  Eyes: Pupils are equal, round, and reactive to light. Conjunctivae are normal.  Cardiovascular: Normal rate, regular rhythm and normal heart sounds.   Pulmonary/Chest: Effort normal and breath sounds normal.  Abdominal: Soft. Bowel sounds are normal. She exhibits no distension. There is no rebound and no guarding.  Slight suprapubic discomfort on palpation  Musculoskeletal: She exhibits no edema.  Neurological: She is alert. Gait normal.  CN 2-12 intact, 5/5 strength in bilateral biceps, triceps, grip, quads, hamstrings, plantar and dorsiflexion, sensation to light touch intact in bilateral UE and LE, normal gait, 2+ patellar reflexes, normal finger to nose, normal rapid alternating movements, normal heel to shin  Skin: Skin is warm and dry. She is not  diaphoretic.  Psychiatric: Mood and affect normal.     Assessment/Plan:   Dyspnea on exertion Issues with this progressively over the last year. Multiple causes possible. No history of heart disease in her family. Could be related to her palpitations. Discussed doing an EKG today though she declined as she can do one at work. She has been anemic in the past though denies bleeding. She does have a history of reactive airway disease though she has not had to use her inhaler for this. She will fax [place] a copy of an EKG that she will do at work. We will obtain lab work as outlined below. Determine next step in management based off of lab results and EKG. May need to have her see cardiology for Holter monitor given prior PVCs. Given return precautions.  Palpitations Potentially PVCs given reported EKGs of PVCs in the past. She's going to do an EKG at her work and send it to [place].  We'll obtain lab work selling below.  Lower abdominal pain Urinalysis and urine pregnancy test negative. Potentially could be muscular or GI related. I offered to do a pelvic exam today though she declined and she will see her gynecologist for this. Evaluate muscular causes as outlined below. Check WBC and sedimentation rate as well.  Myalgia Patient with myalgias and arthralgias for some time now. Also has hand tremor and twitching as well. Had similar symptoms over a year ago that was attributed to azithromycin. Reassuring CT scan at that time. We'll check lab work as outlined below. Discussed referral to neurology though she deferred this until her lab work returns.  Headache Patient with chronic daily headaches as well as intermittent migraines. Neurologically intact today. Does seem to have some memory issues as well. Denies depression. Discussed neurology evaluation though she deferred this at this time. She'll continue to monitor.  History of occupational exposure to risk factor History of needlestick 4 years ago. She  reports this was evaluated through work appropriately and everything was negative. Given wide constellation of symptoms we will check an HIV test.   Orders Placed This Encounter  Procedures  . CBC w/Diff  . Comp Met (CMET)  . TSH  . Sed Rate (ESR)  . Vitamin D (25 hydroxy)  . B12  . HIV antibody (with reflex)  . Magnesium  . CK (Creatine Kinase)  . POCT Urinalysis Dipstick  . POCT urine pregnancy    No orders of the defined types were placed in this encounter.    Tommi Rumps, MD [place]

## 2016-12-18 NOTE — Assessment & Plan Note (Signed)
Urinalysis and urine pregnancy test negative. Potentially could be muscular or GI related. I offered to do a pelvic exam today though she declined and she will see her gynecologist for this. Evaluate muscular causes as outlined below. Check WBC and sedimentation rate as well.

## 2016-12-18 NOTE — Assessment & Plan Note (Signed)
Issues with this progressively over the last year. Multiple causes possible. No history of heart disease in her family. Could be related to her palpitations. Discussed doing an EKG today though she declined as she can do one at work. She has been anemic in the past though denies bleeding. She does have a history of reactive airway disease though she has not had to use her inhaler for this. She will fax [place] a copy of an EKG that she will do at work. We will obtain lab work as outlined below. Determine next step in management based off of lab results and EKG. May need to have her see cardiology for Holter monitor given prior PVCs. Given return precautions.

## 2016-12-18 NOTE — Assessment & Plan Note (Signed)
Patient with chronic daily headaches as well as intermittent migraines. Neurologically intact today. Does seem to have some memory issues as well. Denies depression. Discussed neurology evaluation though she deferred this at this time. She'll continue to monitor.

## 2016-12-18 NOTE — Patient Instructions (Signed)
Nice to see you. We are going to obtain some lab work today. Please do the EKG at work and send it to [place]. Please see your gynecologist as well. If you develop worsening breathing issues, or you develop chest pain, worsening headaches, abdominal pain, fevers, or any new or changing symptoms please be evaluated immediately.

## 2016-12-18 NOTE — Assessment & Plan Note (Signed)
History of needlestick 4 years ago. She reports this was evaluated through work appropriately and everything was negative. Given wide constellation of symptoms we will check an HIV test.

## 2016-12-19 ENCOUNTER — Other Ambulatory Visit: Payer: Self-pay | Admitting: Family Medicine

## 2016-12-19 DIAGNOSIS — R251 Tremor, unspecified: Secondary | ICD-10-CM

## 2016-12-19 DIAGNOSIS — G43009 Migraine without aura, not intractable, without status migrainosus: Secondary | ICD-10-CM

## 2016-12-19 LAB — HIV ANTIBODY (ROUTINE TESTING W REFLEX): HIV 1&2 Ab, 4th Generation: NONREACTIVE

## 2016-12-21 ENCOUNTER — Telehealth: Payer: Self-pay

## 2016-12-21 NOTE — Telephone Encounter (Signed)
Neurology referral previously placed. Please make sure it goes to a [facility] office. Forwarding to [employer].

## 2016-12-21 NOTE — Telephone Encounter (Signed)
Patient notified of ekg results and recommendations, patient states she will set up her own appmt. Patient also would like her neurology referral to go to a [facility] office

## 2016-12-22 ENCOUNTER — Other Ambulatory Visit: Payer: Self-pay | Admitting: Nurse Practitioner

## 2016-12-22 DIAGNOSIS — R0609 Other forms of dyspnea: Principal | ICD-10-CM

## 2016-12-22 DIAGNOSIS — I493 Ventricular premature depolarization: Secondary | ICD-10-CM

## 2016-12-23 ENCOUNTER — Ambulatory Visit
Admission: RE | Admit: 2016-12-23 | Discharge: 2016-12-23 | Disposition: A | Discharge status: Home / Self Care | Payer: 59 | Source: Ambulatory Visit | Attending: Nurse Practitioner | Admitting: Nurse Practitioner

## 2016-12-23 ENCOUNTER — Ambulatory Visit (HOSPITAL_BASED_OUTPATIENT_CLINIC_OR_DEPARTMENT_OTHER)
Admission: RE | Admit: 2016-12-23 | Discharge: 2016-12-23 | Disposition: A | Discharge status: Home / Self Care | Payer: 59 | Source: Ambulatory Visit | Attending: Nurse Practitioner | Admitting: Nurse Practitioner

## 2016-12-23 DIAGNOSIS — I493 Ventricular premature depolarization: Secondary | ICD-10-CM | POA: Insufficient documentation

## 2016-12-23 DIAGNOSIS — R0609 Other forms of dyspnea: Secondary | ICD-10-CM | POA: Diagnosis not present

## 2016-12-23 LAB — ECHOCARDIOGRAM COMPLETE
CHL CUP MV DEC (S): 229
E/e' ratio: 5.86
EWDT: 229 ms
FS: 32 % (ref 28–44)
IVS/LV PW RATIO, ED: 0.81
LA vol A4C: 25.6 ml
LA vol index: 18.7 mL/m2
LA vol: 35.9 mL
LV E/e'average: 5.86
LVEEMED: 5.86
LVELAT: 15.8 cm/s
Lateral S' vel: 13.7 cm/s
MV Peak grad: 3 mmHg
MVPKAVEL: 41.2 m/s
MVPKEVEL: 92.6 m/s
PW: 7.99 mm — AB (ref 0.6–1.1)
TAPSE: 28.4 mm
TDI e' lateral: 15.8
TDI e' medial: 15.3

## 2017-01-04 ENCOUNTER — Ambulatory Visit
Admission: RE | Admit: 2017-01-04 | Discharge: 2017-01-04 | Disposition: A | Discharge status: Home / Self Care | Payer: 59 | Source: Ambulatory Visit | Attending: Nurse Practitioner | Admitting: Nurse Practitioner

## 2017-01-04 ENCOUNTER — Ambulatory Visit (INDEPENDENT_AMBULATORY_CARE_PROVIDER_SITE_OTHER): Payer: 59

## 2017-01-04 ENCOUNTER — Other Ambulatory Visit: Payer: Self-pay | Admitting: Respiratory Therapy

## 2017-01-04 DIAGNOSIS — R000 Tachycardia, unspecified: Secondary | ICD-10-CM | POA: Diagnosis not present

## 2017-01-04 DIAGNOSIS — I493 Ventricular premature depolarization: Secondary | ICD-10-CM | POA: Diagnosis not present

## 2017-01-05 ENCOUNTER — Encounter: Payer: Self-pay | Admitting: Nurse Practitioner

## 2017-01-05 ENCOUNTER — Ambulatory Visit (INDEPENDENT_AMBULATORY_CARE_PROVIDER_SITE_OTHER): Payer: 59 | Admitting: Nurse Practitioner

## 2017-01-05 VITALS — BP 120/69 | HR 67 | Ht 65.0 in | Wt 169.0 lb

## 2017-01-05 DIAGNOSIS — R002 Palpitations: Secondary | ICD-10-CM

## 2017-01-05 DIAGNOSIS — I493 Ventricular premature depolarization: Secondary | ICD-10-CM | POA: Diagnosis not present

## 2017-01-05 DIAGNOSIS — R0609 Other forms of dyspnea: Secondary | ICD-10-CM

## 2017-01-05 DIAGNOSIS — R0789 Other chest pain: Secondary | ICD-10-CM | POA: Diagnosis not present

## 2017-01-05 MED ORDER — METOPROLOL TARTRATE 25 MG PO TABS: ORAL_TABLET | ORAL | 3 refills | Status: DC

## 2017-01-05 NOTE — Progress Notes (Signed)
Cardiology Clinic Note   Patient Name: Sarah Gallagher Date of Encounter: 01/05/2017  Primary Care Provider:  Leone Haven, MD Primary Cardiologist:  New  - will f/u with Sarah Bridge, MD   Patient Profile    36 y/o ? with a recent h/o intermittent palpitations, pvcs, chest discomfort, and DOE, who presents for evaluation.  Past Medical History    Past Medical History:  Diagnosis Date  . Arthritis   . Chest discomfort    a. associated with palpitations.  . Depression   . Dyspnea on exertion    a. 12/2016 Echo: EF 55-60%, no rwma, nl PASP.  Marland Kitchen Juvenile rheumatoid arthritis ([place])   . Meniere disease   . Migraines   . Palpitations   . PVC's (premature ventricular contractions)    a. 01/2017 48h Holter: Rare PAC/PVC. Freq runs of sinus tachycardia.   Past Surgical History:  Procedure Laterality Date  . KNEE SURGERY      Allergies  Allergies  Allergen Reactions  . Zithromax [Azithromycin]     History of Present Illness    36 y/o ? with the above PMH including depression, OA, juvenile rheumatoid arthritis, migraines, palpitations/pvcs associated with chest discomfort, and DOE.  She was previously very active and exercising/running regularly but in 2017, she suffered a foot injury that sidelined her and efforts to get back to exercising have been thwarted by left hip and left IT band pain.  As a result, she has not exercised in 6 months.  In that setting, she has gained ~ 15 lbs since 07/2015.  Over the past six months, she says that things just Gallagher't been right.  She has been experiencing life stress related to changes at work and also at home.  With that, she has noted more frequent headaches (often left sided) with occasional left sided twitching, which she says feels like a mini seizure.  She has been evaluated by primary care and also has seen a chiropractor.  She is pending neurology evaluation.   From a cardiac standpoint, she has been experiencing intermittent  palpitations over the past 6 months.  At times, her heart rate just rises and with that, she experiences mild chest discomfort.  This is usually short-lives, lasting just a few seconds or a minute.  She has also had more of a 'flopping' sensation in her chest at times, which is quite bothersome.  Finally, over the past month or two, she has noted DOE with usual activities, which she had not experienced before.  In that setting, she saw her PCP in Sept and ECG that day showed sinus rhythm with frequent pvc's.  She was set up for an echo, which showed nl LV fxn w/o valvular abnormalities, and a 48 hr holter, which she wore late last week, which did not show any significant arrhythmias.  Periods of chest discomfort or elevated HR were associated with sinus tachycardia.  She says that after turning in the Holter, she did have more frequent PVC's, though these seem to be intermittent and unpredictable.  She denies pnd, orthopnea, n, v, dizziness, syncope, edema, or early satiety.   Home Medications    Prior to Admission medications   Medication Sig Start Date End Date Taking? Authorizing Provider  albuterol (PROVENTIL HFA;VENTOLIN HFA) 108 (90 BASE) MCG/ACT inhaler Inhale 1-2 puffs into the lungs every 6 (six) hours as needed for wheezing or shortness of breath. 03/05/15  Yes Sarah Singer, NP  baclofen (LIORESAL) 10 MG tablet Take  1 tablet (10 mg total) by mouth 3 (three) times daily. 08/14/16  Yes Sarah Gallagher, Sarah Dolin, PA-C  fluticasone (FLONASE) 50 MCG/ACT nasal spray Place 2 sprays into both nostrils daily. 07/14/15  Yes Sarah Gallagher, Sarah Dolin, PA-C  medroxyPROGESTERone (DEPO-PROVERA) 150 MG/ML injection Inject 150 mg into the muscle every 3 (three) months.   Yes [provider]  metoprolol tartrate (LOPRESSOR) 25 MG tablet Take 1/2 tablet twice daily as needed for palpitations 01/05/17   Sarah Mire, NP    Family History    Family History  Problem Relation Age of Onset  . Diabetes Mother   .  Arthritis Father   . Other Father        atrial flutter  . Emphysema Father        smoker  . Diabetes Maternal Aunt   . Diabetes Maternal Uncle   . Stroke Paternal Aunt   . Arthritis Maternal Grandmother   . Diabetes Maternal Grandmother   . Arthritis Paternal Grandmother     Social History    Social History   Social History  . Marital status: Married    Spouse name: N/A  . Number of children: N/A  . Years of education: N/A   Occupational History  . Not on file.   Social History Main Topics  . Smoking status: Never Smoker  . Smokeless tobacco: Never Used  . Alcohol use 0.0 oz/week     Comment: occasionally  . Drug use: No  . Sexual activity: Not on file   Other Topics Concern  . Not on file   Social History Narrative   Lives in [place] with husband and children.  Not currently exercising.  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     Review of Systems    General:  No chills, fever, night sweats or weight changes.  Cardiovascular:  +++ chest discomfort associated with elevated HR, +++ dyspnea on exertion, no edema, orthopnea, palpitations, paroxysmal nocturnal dyspnea. Dermatological: No rash, lesions/masses Respiratory: No cough, +++ dyspnea Urologic: No hematuria, dysuria Abdominal:   No nausea, vomiting, diarrhea, bright red blood per rectum, melena, or hematemesis Neurologic:  No visual changes, wkns, changes in mental status. MSK: +++ left hip and left lateral thigh pain (IT band). +++ arthritic pain of bilat knees. Neuro: +++ intermittent twitching sensations which are bothersome. All other systems reviewed and are otherwise negative except as noted above.  Physical Exam    VS:  BP 120/69 (BP Location: Left Arm, Patient Position: Sitting, Cuff Size: Normal)   Pulse 67   Ht 5\' 5"  (1.651 m)   Wt 169 lb (76.7 kg)   BMI 28.12 kg/m  , BMI Body mass index is 28.12 kg/m. GEN: Well nourished, well developed, in no acute distress.  HEENT: normal.  Neck: Supple, no JVD,  carotid bruits, or masses. Cardiac: RRR, no murmurs, rubs, or gallops. No clubbing, cyanosis, edema.  Radials/DP/PT 2+ and equal bilaterally.  Respiratory:  Respirations regular and unlabored, clear to auscultation bilaterally. GI: Soft, nontender, nondistended, BS + x 4. MS: no deformity or atrophy. Skin: warm and dry, no rash. Neuro:  Strength and sensation are intact. Psych: Normal affect.  Accessory Clinical Findings    ECG - RSR, 67, no acute st/t changes.  Lab Results  Component Value Date   WBC 7.4 12/18/2016   HGB 14.2 12/18/2016   HCT 42.4 12/18/2016   MCV 92.2 12/18/2016   PLT 242.0 12/18/2016   Lab Results  Component Value Date   CREATININE 1.06 12/18/2016  BUN 18 12/18/2016   NA 140 12/18/2016   K 4.1 12/18/2016   CL 106 12/18/2016   CO2 25 12/18/2016        Mg                         2.0                                                                   12/18/2016   Lab Results  Component Value Date   TSH 2.33 12/18/2016   Echo and holter reviewed in detail with patient today and PMH updated with results.  Assessment & Plan   1.  Chest discomfort:  Pt presents with a several month h/o intermittent elevations in HR/palpitations associated with chest discomfort, usually lasting seconds to just a few mins, and resolving spontaneously.  This may occur both at rest and with exertion.  Recent ECG showed freq pvc's while f/u monitoring did not show any significant arrhythmias and periods of chest discomfort were associated with sinus tachycardia.  Echo showed nl EF.  She has no prior cardiac hx and no significant RF.  ECG today is normal.  I will arrange for ETT to assess exercise tolerance and r/o ischemic ECG changes.  If this is nl, I would not pursue any further ischemic evaluation.  2.  Palpitations/PVCs:  As above, pt has been having palpitations and was noted to have multiple PVCs on ecg 9/17.  Labs that day were unremarkable.  Echo showed nl EF. Holter did not  show any significant PVC burden, though she still does feel them from time to time, esp after lying down for bed @ night.  PVCs can be bothersome and do keep her awake.  We discussed the potential role of event monitoring to better capture PVC burden over time, but agreed that as symptoms are generally infrequent, and previously documented by ECG, that it would be ok to forego further monitoring at this time.  She is interested in medical therapy for PVC's and I have provided a Rx for metoprolol 12.5 mg BID PRN palpitations/PVCs.  3.  Dyspnea on Exertion:  1-2 month h/o DOE.  Echo showed nl EF.  Euvolemic on exam.  Though she does have a h/o what sounds like exercise induced asthma, she does not typically wheeze or require an inhaler.  Obtaining ETT as above.  4.  Disposition:  F/U ETT.  F/u in clinic in 1 month to re-eval.  Murray Hodgkins, NP 01/05/2017, 2:17 PM

## 2017-01-05 NOTE — Patient Instructions (Addendum)
Medication Instructions:  Your physician has recommended you make the following change in your medication:  START taking metoprolol 12.5mg  (1/2 tablet) twice a day AS NEEDED for palpitations.   Labwork: none  Testing/Procedures: Your physician has requested that you have an exercise tolerance test. For further information please visit [email or web address]. Please also follow instruction sheet, as given.  No caffeine or smoking 24 hours before the test. Wear comfortable walking shoes (ie., sneakers) Do not take metoprolol the morning of the test.  Please bring your inhaler.   Follow-Up: Your physician recommends that you schedule a follow-up appointment in: 1 month with Ignacia Bayley, NP, or Dr. Rockey Situ.    Any Other Special Instructions Will Be Listed Below (If Applicable).     If you need a refill on your cardiac medications before your next appointment, please call your pharmacy.   Exercise Stress Electrocardiogram An exercise stress electrocardiogram is a test that is done to evaluate the blood supply to your heart. This test may also be called exercise stress electrocardiography. The test is done while you are walking on a treadmill. The goal of this test is to raise your heart rate. This test is done to find areas of poor blood flow to the heart by determining the extent of coronary artery disease (CAD). CAD is defined as narrowing in one or more heart (coronary) arteries of more than 70%. If you have an abnormal test result, this may mean that you are not getting adequate blood flow to your heart during exercise. Additional testing may be needed to understand why your test was abnormal. Tell a health care provider about:  Any allergies you have.  All medicines you are taking, including vitamins, herbs, eye drops, creams, and over-the-counter medicines.  Any problems you or family members have had with anesthetic medicines.  Any blood disorders you have.  Any surgeries you  have had.  Any medical conditions you have.  Possibility of pregnancy, if this applies. What are the risks? Generally, this is a safe procedure. However, as with any procedure, complications can occur. Possible complications can include:  Pain or pressure in the following areas: ? Chest. ? Jaw or neck. ? Between your shoulder blades. ? Radiating down your left arm.  Dizziness or light-headedness.  Shortness of breath.  Increased or irregular heartbeats.  Nausea or vomiting.  Heart attack (rare).  What happens before the procedure?  Avoid all forms of caffeine 24 hours before your test or as directed by your health care provider. This includes coffee, tea (even decaffeinated tea), caffeinated sodas, chocolate, cocoa, and certain pain medicines.  Follow your health care provider's instructions regarding eating and drinking before the test.  Take your medicines as directed at regular times with water unless instructed otherwise. Exceptions may include: ? If you have diabetes, ask how you are to take your insulin or pills. It is common to adjust insulin dosing the morning of the test. ? If you are taking beta-blocker medicines, it is important to talk to your health care provider about these medicines well before the date of your test. Taking beta-blocker medicines may interfere with the test. In some cases, these medicines need to be changed or stopped 24 hours or more before the test. ? If you wear a nitroglycerin patch, it may need to be removed prior to the test. Ask your health care provider if the patch should be removed before the test.  If you use an inhaler for any breathing condition, bring it with  you to the test.  If you are an outpatient, bring a snack so you can eat right after the stress phase of the test.  Do not smoke for 4 hours prior to the test or as directed by your health care provider.  Do not apply lotions, powders, creams, or oils on your chest prior to  the test.  Wear loose-fitting clothes and comfortable shoes for the test. This test involves walking on a treadmill. What happens during the procedure?  Multiple patches (electrodes) will be put on your chest. If needed, small areas of your chest may have to be shaved to get better contact with the electrodes. Once the electrodes are attached to your body, multiple wires will be attached to the electrodes and your heart rate will be monitored.  Your heart will be monitored both at rest and while exercising.  You will walk on a treadmill. The treadmill will be started at a slow pace. The treadmill speed and incline will gradually be increased to raise your heart rate. What happens after the procedure?  Your heart rate and blood pressure will be monitored after the test.  You may return to your normal schedule including diet, activities, and medicines, unless your health care provider tells you otherwise. This information is not intended to replace advice given to you by your health care provider. Make sure you discuss any questions you have with your health care provider. Document Released: 03/17/2000 Document Revised: 08/26/2015 Document Reviewed: 11/25/2012 Elsevier Interactive Patient Education  2017 [employer].

## 2017-01-17 DIAGNOSIS — Z3042 Encounter for surveillance of injectable contraceptive: Secondary | ICD-10-CM | POA: Diagnosis not present

## 2017-01-17 DIAGNOSIS — Z6827 Body mass index (BMI) 27.0-27.9, adult: Secondary | ICD-10-CM | POA: Diagnosis not present

## 2017-01-17 DIAGNOSIS — Z01419 Encounter for gynecological examination (general) (routine) without abnormal findings: Secondary | ICD-10-CM | POA: Diagnosis not present

## 2017-01-25 ENCOUNTER — Telehealth: Payer: Self-pay | Admitting: *Deleted

## 2017-01-25 NOTE — Telephone Encounter (Signed)
No answer. Left message with appointment date and time for exercise stress test tomorrow and not to have caffeine for 24 hours prior, wear comfortable walking shoes, and do not take Metoprolol tonight or tomorrow.

## 2017-01-30 NOTE — Progress Notes (Signed)
[name] [address] [place], [place] [address] Phone: [phone] Subjective:    I'm seeing this patient by the request  of:  Leone Haven, MD   CC: Left hip pain  [id number]  Sarah Gallagher is a 36 y.o. female coming in with for left knee and hip pain. Her knee pain is on the lateral side. Her hip is bothering her over the greater trochanter. She used to be very active but is unable to do so. Patient has seen a chiropractor which has helped some. She has constant, sharp pain in both locations. She has tried NSAIDs. She also has had massage twice but the pain comes back. Patient states significant stiffness of the back. Describes it as a dull, throbbing aching pain. Possible radiation to the hips she thinks. States that aching in nature. Rates the severity of pain overall is 6 out of 10. Patient is concerned because she would like to work out more but is unable to secondary to the discomfort and pain       Past Medical History:  Diagnosis Date  . Arthritis   . Chest discomfort    a. associated with palpitations.  . Depression   . Dyspnea on exertion    a. 12/2016 Echo: EF 55-60%, no rwma, nl PASP.  Marland Kitchen Juvenile rheumatoid arthritis ([place])   . Meniere disease   . Migraines   . Palpitations   . PVC's (premature ventricular contractions)    a. 01/2017 48h Holter: Rare PAC/PVC. Freq runs of sinus tachycardia.   Past Surgical History:  Procedure Laterality Date  . KNEE SURGERY     Social History   Social History  . Marital status: Married    Spouse name: N/A  . Number of children: N/A  . Years of education: N/A   Social History Main Topics  . Smoking status: Never Smoker  . Smokeless tobacco: Never Used  . Alcohol use 0.0 oz/week     Comment: occasionally  . Drug use: No  . Sexual activity: Not Asked   Other Topics Concern  . None   Social History Narrative   Lives in [place] with husband and children.  Not currently  exercising.  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   Allergies  Allergen Reactions  . Zithromax [Azithromycin]    Family History  Problem Relation Age of Onset  . Diabetes Mother   . Arthritis Father   . Other Father        atrial flutter  . Emphysema Father        smoker  . Diabetes Maternal Aunt   . Diabetes Maternal Uncle   . Stroke Paternal Aunt   . Arthritis Maternal Grandmother   . Diabetes Maternal Grandmother   . Arthritis Paternal Grandmother      Past medical history, social, surgical and family history all reviewed in electronic medical record.  No pertanent information unless stated regarding to the chief complaint.   Review of Systems:Review of systems updated and as accurate as of 01/31/17  No headache, visual changes, nausea, vomiting, diarrhea, constipation, dizziness, abdominal pain, skin rash, fevers, chills, night sweats, weight loss, swollen lymph nodes, body aches, joint swelling, chest pain, shortness of breath, mood changes. Positive muscle aches  Objective  Blood pressure 116/78, pulse 90, height 5' 5.5" (1.664 m), weight 170 lb (77.1 kg), SpO2 97 %. Systems examined below as of 01/31/17   General: No apparent distress alert and oriented x3 mood and affect normal,  dressed appropriately.  HEENT: Pupils equal, extraocular movements intact  Respiratory: Patient's speak in full sentences and does not appear short of breath  Cardiovascular: No lower extremity edema, non tender, no erythema  Skin: Warm dry intact with no signs of infection or rash on extremities or on axial skeleton.  Abdomen: Soft nontender  Neuro: Cranial nerves II through XII are intact, neurovascularly intact in all extremities with 2+ DTRs and 2+ pulses.  Lymph: No lymphadenopathy of posterior or anterior cervical chain or axillae bilaterally.  Gait normal with good balance and coordination.  MSK:  Non tender with full range of motion and good stability and symmetric strength and tone of shoulders, elbows,  wrist,  knee and ankles bilaterally.  Hip: Left ROM IR: 25 Deg, ER: 35 Deg, Flexion: 120 Deg, Extension: 80 Deg, Abduction: 45 Deg, Adduction: 20 Deg Strength IR: 5/5, ER: 5/5, Flexion: 5/5, Extension: 5/5, Abduction: 5/5, Adduction: 5/5 Pelvic alignment unremarkable to inspection and palpation. Standing hip rotation and gait without trendelenburg sign / unsteadiness. Severe tenderness to palpation of the greater trochanteric area.Marland Kitchen Positive Faber Back exam shows the patient does have severe tenderness to palpation over the left sacroiliac joint. Positive Faber but negative straight leg test.     Procedure: Real-time Ultrasound Guided Injection of left  greater trochanteric bursitis secondary to patient's body habitus Device: GE Logiq Q7  Ultrasound guided injection is preferred based studies that show increased duration, increased effect, greater accuracy, decreased procedural pain, increased response rate, and decreased cost with ultrasound guided versus blind injection.  Verbal informed consent obtained.  Time-out conducted.  Noted no overlying erythema, induration, or other signs of local infection.  Skin prepped in a sterile fashion.  Local anesthesia: Topical Ethyl chloride.  With sterile technique and under real time ultrasound guidance:  Greater trochanteric area was visualized and patient's bursa was noted. A 22-gauge 3 inch needle was inserted and 4 cc of 0.5% Marcaine and 1 cc of Kenalog 40 mg/dL was injected. Pictures taken Completed without difficulty  Pain immediately resolved suggesting accurate placement of the medication.  Advised to call if fevers/chills, erythema, induration, drainage, or persistent bleeding.  Images permanently stored and available for review in the ultrasound unit.  Impression: Technically successful ultrasound guided injection.  Osteopathic findings T3 extended rotated and side bent right inhaled third rib T9 extended rotated and side bent left L2  flexed rotated and side bent right Sacrum right on right  Impression and Recommendations:     This case required medical decision making of moderate complexity.      Note: This dictation was prepared with Dragon dictation along with smaller phrase technology. Any transcriptional errors that result from this process are unintentional.

## 2017-01-31 ENCOUNTER — Ambulatory Visit (INDEPENDENT_AMBULATORY_CARE_PROVIDER_SITE_OTHER): Payer: 59 | Admitting: Family Medicine

## 2017-01-31 ENCOUNTER — Encounter: Payer: Self-pay | Admitting: Family Medicine

## 2017-01-31 ENCOUNTER — Ambulatory Visit: Payer: Self-pay

## 2017-01-31 VITALS — BP 116/78 | HR 90 | Ht 65.5 in | Wt 170.0 lb

## 2017-01-31 DIAGNOSIS — G8929 Other chronic pain: Secondary | ICD-10-CM

## 2017-01-31 DIAGNOSIS — M999 Biomechanical lesion, unspecified: Secondary | ICD-10-CM | POA: Insufficient documentation

## 2017-01-31 DIAGNOSIS — M545 Low back pain, unspecified: Secondary | ICD-10-CM | POA: Insufficient documentation

## 2017-01-31 DIAGNOSIS — M25562 Pain in left knee: Secondary | ICD-10-CM | POA: Diagnosis not present

## 2017-01-31 DIAGNOSIS — M7062 Trochanteric bursitis, left hip: Secondary | ICD-10-CM | POA: Diagnosis not present

## 2017-01-31 MED ORDER — DICLOFENAC SODIUM 2 % TD SOLN
2.0000 | Freq: Two times a day (BID) | TRANSDERMAL | 3 refills | Status: DC
Start: 2017-01-31 — End: 2017-11-06

## 2017-01-31 NOTE — Assessment & Plan Note (Signed)
Low back pain. Likely multifactorial be mostly muscular skeletal. Poor core strengthening abductor strength. Work with a 5 tender. Responded well to osteopathic manipulation. Follow-up again in 4 weeks

## 2017-01-31 NOTE — Assessment & Plan Note (Signed)
Decision today to treat with OMT was based on Physical Exam  After verbal consent patient was treated with HVLA, ME, FPR techniques in  thoracic, lumbar and sacral areas  Patient tolerated the procedure well with improvement in symptoms  Patient given exercises, stretches and lifestyle modifications  See medications in patient instructions if given  Patient will follow up in 4 weeks 

## 2017-01-31 NOTE — Patient Instructions (Signed)
Good to see you.  Ice 20 minutes 2 times daily. Usually after activity and before bed. Exercises 3 times a week.  pennsaid pinkie amount topically 2 times daily as needed.   OK to work out but more biking and [employer], only 3 times a week and eat within 30 minutes of working out.  Stay active.  See me again in 4 weeks.

## 2017-01-31 NOTE — Assessment & Plan Note (Signed)
Patient given injection. Tolerated the procedure well. Discussed icing regimen. Given a prescription for topical anti-inflammatories. Home exercises and work with Product/process development scientist. Follow-up again in 4 weeks knee pain consider further workup of back

## 2017-02-12 DIAGNOSIS — R87612 Low grade squamous intraepithelial lesion on cytologic smear of cervix (LGSIL): Secondary | ICD-10-CM | POA: Diagnosis not present

## 2017-02-14 ENCOUNTER — Ambulatory Visit: Payer: Self-pay | Admitting: Cardiovascular Disease

## 2017-02-27 NOTE — Progress Notes (Signed)
[facility] [address] [place], [place] [address] Phone: [phone] Subjective:    I'm seeing this patient by the request  of:    CC: hip pain   [id number]  Sarah Gallagher is a 36 y.o. female coming in for follow up for left hip pain. She said that the injection did help but that she still has pain in the glute and and lateral hamstring insertion. She is also having pain in the L4-L5 vertebral area where she can pinpoint pain. Patient states that pain can radiate down in to her foot. This occurs mostly when she is supine.       Past Medical History:  Diagnosis Date  . Arthritis   . Chest discomfort    a. associated with palpitations.  . Depression   . Dyspnea on exertion    a. 12/2016 Echo: EF 55-60%, no rwma, nl PASP.  Marland Kitchen Juvenile rheumatoid arthritis ([place])   . Meniere disease   . Migraines   . Palpitations   . PVC's (premature ventricular contractions)    a. 01/2017 48h Holter: Rare PAC/PVC. Freq runs of sinus tachycardia.   Past Surgical History:  Procedure Laterality Date  . KNEE SURGERY     Social History   Socioeconomic History  . Marital status: Married    Spouse name: Not on file  . Number of children: Not on file  . Years of education: Not on file  . Highest education level: Not on file  Social Needs  . Financial resource strain: Not on file  . Food insecurity - worry: Not on file  . Food insecurity - inability: Not on file  . Transportation needs - medical: Not on file  . Transportation needs - non-medical: Not on file  Occupational History  . Not on file  Tobacco Use  . Smoking status: Never Smoker  . Smokeless tobacco: Never Used  Substance and Sexual Activity  . Alcohol use: Yes    Alcohol/week: 0.0 oz    Comment: occasionally  . Drug use: No  . Sexual activity: Not on file  Other Topics Concern  . Not on file  Social History Narrative   Lives in [place] with husband and children.  Not currently  exercising.  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   Allergies  Allergen Reactions  . Zithromax [Azithromycin]    Family History  Problem Relation Age of Onset  . Diabetes Mother   . Arthritis Father   . Other Father        atrial flutter  . Emphysema Father        smoker  . Diabetes Maternal Aunt   . Diabetes Maternal Uncle   . Stroke Paternal Aunt   . Arthritis Maternal Grandmother   . Diabetes Maternal Grandmother   . Arthritis Paternal Grandmother      Past medical history, social, surgical and family history all reviewed in electronic medical record.  No pertanent information unless stated regarding to the chief complaint.   Review of Systems:Review of systems updated and as accurate as of 02/27/17  No headache, visual changes, nausea, vomiting, diarrhea, constipation, dizziness, abdominal pain, skin rash, fevers, chills, night sweats, weight loss, swollen lymph nodes, body aches, joint swelling,  chest pain, shortness of breath, mood changes. Positive muscle aches  Objective  There were no vitals taken for this visit. Systems examined below as of 02/27/17   General: No apparent distress alert and oriented x3 mood and affect normal, dressed appropriately.  HEENT: Pupils equal, extraocular movements intact  Respiratory: Patient's speak in full sentences and does not appear short of breath  Cardiovascular: No lower extremity edema, non tender, no erythema  Skin: Warm dry intact with no signs of infection or rash on extremities or on axial skeleton.  Abdomen: Soft nontender  Neuro: Cranial nerves II through XII are intact, neurovascularly intact in all extremities with 2+ DTRs and 2+ pulses.  Lymph: No lymphadenopathy of posterior or anterior cervical chain or axillae bilaterally.  Gait normal with good balance and coordination.  MSK:  Non tender with full range of motion and good stability and symmetric strength and tone of shoulders, elbows, wrist, hip, knee and ankles bilaterally.  Back  Exam:  Inspection: Unremarkable  Motion: Flexion 45 deg, Extension 45 deg, Side Bending to 45 deg bilaterally,  Rotation to 45 deg bilaterally  SLR laying: Negative  XSLR laying: Negative  Palpable tenderness: None. FABER: negative. Sensory change: Gross sensation intact to all lumbar and sacral dermatomes.  Reflexes: 2+ at both patellar tendons, 2+ at achilles tendons, Babinski's downgoing.  Strength at foot  Plantar-flexion: 5/5 Dorsi-flexion: 5/5 Eversion: 5/5 Inversion: 5/5  Leg strength  Quad: 5/5 Hamstring: 5/5 Hip flexor: 5/5 Hip abductors: 5/5  Gait unremarkable.  Osteopathic findings C6 flexed rotated and side bent left T7 extended rotated and side bent left inhaled rib T9 extended rotated and side bent left L5 flexed rotated and side bent left Sacrum right on right    Impression and Recommendations:     This case required medical decision making of moderate complexity.      Note: This dictation was prepared with Dragon dictation along with smaller phrase technology. Any transcriptional errors that result from this process are unintentional.

## 2017-02-28 ENCOUNTER — Ambulatory Visit (INDEPENDENT_AMBULATORY_CARE_PROVIDER_SITE_OTHER): Payer: 59 | Admitting: Family Medicine

## 2017-02-28 ENCOUNTER — Ambulatory Visit (INDEPENDENT_AMBULATORY_CARE_PROVIDER_SITE_OTHER)
Admission: RE | Admit: 2017-02-28 | Discharge: 2017-02-28 | Disposition: A | Discharge status: Home / Self Care | Payer: Self-pay | Source: Ambulatory Visit | Attending: Family Medicine | Admitting: Family Medicine

## 2017-02-28 ENCOUNTER — Encounter: Payer: Self-pay | Admitting: Family Medicine

## 2017-02-28 VITALS — BP 110/68 | HR 90 | Ht 66.0 in | Wt 168.0 lb

## 2017-02-28 DIAGNOSIS — M999 Biomechanical lesion, unspecified: Secondary | ICD-10-CM

## 2017-02-28 DIAGNOSIS — M545 Low back pain, unspecified: Secondary | ICD-10-CM

## 2017-02-28 DIAGNOSIS — G8929 Other chronic pain: Secondary | ICD-10-CM

## 2017-02-28 DIAGNOSIS — M7072 Other bursitis of hip, left hip: Secondary | ICD-10-CM | POA: Diagnosis not present

## 2017-02-28 DIAGNOSIS — M25552 Pain in left hip: Secondary | ICD-10-CM

## 2017-02-28 MED ORDER — GABAPENTIN 100 MG PO CAPS: 200.0000 mg | ORAL_CAPSULE | Freq: Every day | ORAL | 3 refills | Status: DC

## 2017-02-28 NOTE — Assessment & Plan Note (Signed)
Decision today to treat with OMT was based on Physical Exam  After verbal consent patient was treated with HVLA, ME, FPR techniques in cervical, thoracic, lumbar and sacral areas  Patient tolerated the procedure well with improvement in symptoms  Patient given exercises, stretches and lifestyle modifications  See medications in patient instructions if given  Patient will follow up in 4 weeks 

## 2017-02-28 NOTE — Assessment & Plan Note (Signed)
Patient is still having some signs and symptoms that is consistent with more of a facet arthropathy. X-rays ordered today to further evaluate. I do think that there is a possibility for for some lumbar radiculopathy. Patient given gabapentin at night. Still responding well to a supine manipulation. Continue home exercises and following up in 4 weeks.

## 2017-02-28 NOTE — Patient Instructions (Signed)
Good to see you  You are doing great  Continue the exercises and try the vitamins Gabapentin 200mg  at night Xrays downstairs today to mae sure nothing else is happening See me again in 4 weeks

## 2017-03-02 ENCOUNTER — Encounter: Payer: Self-pay | Admitting: Nurse Practitioner

## 2017-03-19 ENCOUNTER — Encounter: Payer: Self-pay | Admitting: Diagnostic Neuroimaging

## 2017-03-19 ENCOUNTER — Telehealth: Payer: Self-pay | Admitting: Diagnostic Neuroimaging

## 2017-03-19 ENCOUNTER — Ambulatory Visit: Payer: 59 | Admitting: Diagnostic Neuroimaging

## 2017-03-19 VITALS — BP 103/69 | HR 88 | Ht 66.0 in | Wt 173.8 lb

## 2017-03-19 DIAGNOSIS — G252 Other specified forms of tremor: Secondary | ICD-10-CM

## 2017-03-19 DIAGNOSIS — G43109 Migraine with aura, not intractable, without status migrainosus: Secondary | ICD-10-CM | POA: Diagnosis not present

## 2017-03-19 DIAGNOSIS — M5416 Radiculopathy, lumbar region: Secondary | ICD-10-CM | POA: Diagnosis not present

## 2017-03-19 MED ORDER — TOPIRAMATE 50 MG PO TABS: 50.0000 mg | ORAL_TABLET | Freq: Two times a day (BID) | ORAL | 12 refills | Status: DC

## 2017-03-19 NOTE — Telephone Encounter (Signed)
Pt returned Emily's call °

## 2017-03-19 NOTE — Telephone Encounter (Signed)
Left a voicemail for patient to call back about scheduling mri.

## 2017-03-19 NOTE — Progress Notes (Signed)
[facility]  PATIENT: Sarah Gallagher DOB: 17-Aug-1980  REFERRING CLINICIAN: Cornelius Moras, MD  HISTORY FROM: patient and chart review  REASON FOR VISIT: new consult    HISTORICAL  CHIEF COMPLAINT:  Chief Complaint  Patient presents with  . Migraine    rm 7, New Pt, "headaches since a little girl, nerve pain, tremors; Excedrin Migraine daily, take sumatriptan but causes chest pain, arm pain"    HISTORY OF PRESENT ILLNESS:   36 year old right-handed female here for evaluation of headaches, nerve pain and tremors.  Regarding headaches patient has had these since childhood, consisting of left eye and left head pain, triggered by perfume and rain, associated with photophobia, phonophobia and nausea.  Patient having one headache per week lasting 2-3 days at a time.  Sometimes she sees a moving grid or floaters with her headaches.  She has tried Imitrex without relief.  Patient also has "nerve pain" for past few months, typically related to her neck radiating into her left arm as well as low back pain rating to the left leg.  Patient also has intermittent tremors were her whole body will stiffen up and "drop" without alteration or loss of consciousness.  Sometimes her muscles twitch and move especially in her fingers and thumbs.    REVIEW OF SYSTEMS: Full 14 system review of systems performed and negative with exception of: Fatigue palpitations hearing loss ringing ears blurred vision eye pain anemia memory loss headache weakness dizziness tremor joint pain runny nose decreased energy skin sensitivity.  ALLERGIES: Allergies  Allergen Reactions  . Zithromax [Azithromycin] Other (See Comments)    "convulsions or tremors"    HOME MEDICATIONS: Outpatient Medications Prior to Visit  Medication Sig Dispense Refill  . albuterol (PROVENTIL HFA;VENTOLIN HFA) 108 (90 BASE) MCG/ACT inhaler Inhale 1-2 puffs into the lungs every 6 (six) hours as needed for wheezing or  shortness of breath. 1 Inhaler 2  . aspirin-acetaminophen-caffeine (EXCEDRIN MIGRAINE) 250-250-65 MG tablet Take by mouth daily.    . baclofen (LIORESAL) 10 MG tablet Take 1 tablet (10 mg total) by mouth 3 (three) times daily. 30 each 0  . Diclofenac Sodium (PENNSAID) 2 % SOLN Place 2 application onto the skin 2 (two) times daily. 112 g 3  . fluticasone (FLONASE) 50 MCG/ACT nasal spray Place 2 sprays into both nostrils daily. 16 g 6  . gabapentin (NEURONTIN) 100 MG capsule Take 2 capsules (200 mg total) by mouth at bedtime. 60 capsule 3  . medroxyPROGESTERone (DEPO-PROVERA) 150 MG/ML injection Inject 150 mg into the muscle every 3 (three) months.    . SUMAtriptan (IMITREX) 50 MG tablet 50 mg as needed.  2  . metoprolol tartrate (LOPRESSOR) 25 MG tablet Take 1/2 tablet twice daily as needed for palpitations (Patient not taking: Reported on 03/19/2017) 30 tablet 3   No facility-administered medications prior to visit.     PAST MEDICAL HISTORY: Past Medical History:  Diagnosis Date  . Arthritis   . Chest discomfort    a. associated with palpitations.  . Depression   . Dyspnea on exertion    a. 12/2016 Echo: EF 55-60%, no rwma, nl PASP.  Marland Kitchen Juvenile rheumatoid arthritis ([place])   . Meniere disease   . Migraines   . Palpitations   . PVC's (premature ventricular contractions)    a. 01/2017 48h Holter: Rare PAC/PVC. Freq runs of sinus tachycardia.    PAST SURGICAL HISTORY: Past Surgical History:  Procedure Laterality Date  . [place]  1997, 1997  torn meniscus, ACL replacement  . REFRACTIVE SURGERY     lasix    FAMILY HISTORY: Family History  Problem Relation Age of Onset  . Diabetes Mother   . Arthritis Father   . Other Father        atrial flutter  . Emphysema Father        smoker  . Diabetes Maternal Aunt   . Diabetes Maternal Uncle   . Stroke Paternal Aunt   . Arthritis Maternal Grandmother   . Diabetes Maternal Grandmother   . Cancer Maternal Grandmother         skin  . Arthritis Paternal Grandmother   . Cancer Paternal Grandmother        skin    SOCIAL HISTORY:  Social History   Socioeconomic History  . Marital status: Married    Spouse name: Not on file  . Number of children: 3  . Years of education: BS  . Highest education level: Not on file  Social Needs  . Financial resource strain: Not on file  . Food insecurity - worry: Not on file  . Food insecurity - inability: Not on file  . Transportation needs - medical: Not on file  . Transportation needs - non-medical: Not on file  Occupational History    Comment: [employer], Resp Therapist  Tobacco Use  . Smoking status: Never Smoker  . Smokeless tobacco: Never Used  Substance and Sexual Activity  . Alcohol use: Yes    Alcohol/week: 0.0 oz    Comment: rare  . Drug use: No  . Sexual activity: Not on file  Other Topics Concern  . Not on file  Social History Narrative   Lives in [place] with husband and children.  Not currently exercising.  Resp tech @ .   Caffeine 2-3 cups daily     PHYSICAL EXAM  GENERAL EXAM/CONSTITUTIONAL: Vitals:  Vitals:   03/19/17 1251  BP: 103/69  Pulse: 88  Weight: 173 lb 12.8 oz (78.8 kg)  Height: 5\' 6"  (1.676 m)     Body mass index is 28.05 kg/m.  Visual Acuity Screening   Right eye Left eye Both eyes  Without correction:     With correction: 20/20 20/20      Patient is in no distress; well developed, nourished and groomed; neck is supple  CARDIOVASCULAR:  Examination of carotid arteries is normal; no carotid bruits  Regular rate and rhythm, no murmurs  Examination of peripheral vascular system by observation and palpation is normal  EYES:  Ophthalmoscopic exam of optic discs and posterior segments is normal; no papilledema or hemorrhages  MUSCULOSKELETAL:  Gait, strength, tone, movements noted in Neurologic exam below  NEUROLOGIC: MENTAL STATUS:  No flowsheet data found.  awake, alert, oriented to  person, place and time  recent and remote memory intact  normal attention and concentration  language fluent, comprehension intact, naming intact,   fund of knowledge appropriate  CRANIAL NERVE:   2nd - no papilledema on fundoscopic exam  2nd, 3rd, 4th, 6th - pupils equal and reactive to light, visual fields full to confrontation, extraocular muscles intact, no nystagmus  5th - facial sensation symmetric  7th - facial strength symmetric  8th - hearing intact  9th - palate elevates symmetrically, uvula midline  11th - shoulder shrug symmetric  12th - tongue protrusion midline  MOTOR:   normal bulk and tone, full strength in the BUE, BLE  MILD POSTURAL TREMOR IN LUE > RUE  SENSORY:  normal and symmetric to light touch, temperature, vibration  COORDINATION:   finger-nose-finger, fine finger movements normal  REFLEXES:   deep tendon reflexes present and symmetric  GAIT/STATION:   narrow based gait; able to walk on toes, heels and tandem; romberg is negative    DIAGNOSTIC DATA (LABS, IMAGING, TESTING) - I reviewed patient records, labs, notes, testing and imaging myself where available.  Lab Results  Component Value Date   WBC 7.4 12/18/2016   HGB 14.2 12/18/2016   HCT 42.4 12/18/2016   MCV 92.2 12/18/2016   PLT 242.0 12/18/2016      Component Value Date/Time   NA 140 12/18/2016 1151   K 4.1 12/18/2016 1151   CL 106 12/18/2016 1151   CO2 25 12/18/2016 1151   GLUCOSE 90 12/18/2016 1151   BUN 18 12/18/2016 1151   CREATININE 1.06 12/18/2016 1151   CALCIUM 9.7 12/18/2016 1151   PROT 7.3 12/18/2016 1151   ALBUMIN 4.4 12/18/2016 1151   AST 13 12/18/2016 1151   ALT 13 12/18/2016 1151   ALKPHOS 50 12/18/2016 1151   BILITOT 0.4 12/18/2016 1151   GFRNONAA >60 07/16/2015 0831   GFRAA >60 07/16/2015 0831   No results found for: CHOL, HDL, LDLCALC, LDLDIRECT, TRIG, CHOLHDL No results found for: HGBA1C Lab Results  Component Value Date   VITAMINB12  426 12/18/2016   Lab Results  Component Value Date   TSH 2.33 12/18/2016     07/16/15 CT head  - Negative head CT.    ASSESSMENT AND PLAN  36 y.o. year old female here with HA, nerve pain and tremors.    Dx:  1. Migraine with aura and without status migrainosus, not intractable   2. Postural tremor   3. Lumbar radiculopathy      PLAN:   HEADACHES (likely MIGRAINE WITH AURA) - start topiramate 50mg  at bedtime; after 1 week increase to twice a day; drink plenty of water - continue sumatriptan 50mg  as needed for breakthrough headache; may repeat x 1 after 2 hours; max 2 tabs per day or 8 per month  TREMORS (Ddx: enhanced physiologic tremor, metabolic, stress, CNS autoimmune, inflamm) - check MRI brain  NECK PAIN (arthritis, musculoskeletal, migraine) - conservative mgmt  LOW BACK PAIN (arthritis, musculoskeletal) - conservative mgmt  Orders Placed This Encounter  Procedures  . MR BRAIN W WO CONTRAST   Meds ordered this encounter  Medications  . topiramate (TOPAMAX) 50 MG tablet    Sig: Take 1 tablet (50 mg total) by mouth 2 (two) times daily.    Dispense:  60 tablet    Refill:  12   Return in about 6 months (around 09/17/2017).    Penni Bombard, MD 47/82/9562, 1:30 PM Certified in Neurology, Neurophysiology and Neuroimaging  [facility] [address] [place], [place] [address] [phone]

## 2017-03-19 NOTE — Patient Instructions (Signed)
HEADACHES / MIGRAINE WITH AURA - start topiramate 50mg  at bedtime; after 1 week increase to twice a day; drink plenty of water - continue sumatriptan 50mg  as needed for breakthrough headache; may repeat x 1 after 2 hours; max 2 tabs per day or 8 per month  TREMORS - check MRI brain  NECK PAIN (arthritis, musculoskeletal, migraine) - conservative mgmt  LOW BACK PAIN (arthritis, musculoskeletal) - conservative mgmt

## 2017-03-21 ENCOUNTER — Other Ambulatory Visit: Payer: 59

## 2017-03-21 NOTE — Telephone Encounter (Signed)
Noted,  Thank you!

## 2017-03-21 NOTE — Telephone Encounter (Signed)
Pt returned Emily's call. She works at [employer] and is having MRI there on Monday 12/24. Pt does not need a call back unless there is a problem.  FYI

## 2017-03-22 NOTE — Telephone Encounter (Signed)
Pt called she has been advised MRI at [facility] will be twice the cost as what it would be for her to have it at [place] unit. Please call to discuss with her at [phone].

## 2017-03-22 NOTE — Telephone Encounter (Signed)
I spoke to the patient and she is scheduled for 03/28/17 at our [place] mobile unit instead of [place].

## 2017-03-26 ENCOUNTER — Ambulatory Visit: Payer: 59

## 2017-03-28 ENCOUNTER — Ambulatory Visit: Payer: 59

## 2017-03-28 DIAGNOSIS — G252 Other specified forms of tremor: Secondary | ICD-10-CM | POA: Diagnosis not present

## 2017-03-28 DIAGNOSIS — G43109 Migraine with aura, not intractable, without status migrainosus: Secondary | ICD-10-CM

## 2017-03-28 MED ORDER — GADOPENTETATE DIMEGLUMINE 469.01 MG/ML IV SOLN: 15.0000 mL | Freq: Once | INTRAVENOUS | Status: DC | PRN

## 2017-03-28 NOTE — Progress Notes (Signed)
[name] [address], [place] [address] Phone: [phone] Subjective:     CC: Back pain and hip pain follow-up  [id number]  Sarah Gallagher is a 36 y.o. female coming in with complaint of back pain and left hip pain.  Patient was given a greater trochanteric injection January 31, 2017.  Patient did have back x-rays that were independently visualized by me showing no bony abnormality.  Patient wants to the case there was some lumbar radiculopathy.  Started also osteopathic manipulation.  Patient states doing relatively well at this point.Still having pain. Not as severe.      Past Medical History:  Diagnosis Date  . Arthritis   . Chest discomfort    a. associated with palpitations.  . Depression   . Dyspnea on exertion    a. 12/2016 Echo: EF 55-60%, no rwma, nl PASP.  Marland Kitchen Juvenile rheumatoid arthritis ([place])   . Meniere disease   . Migraines   . Palpitations   . PVC's (premature ventricular contractions)    a. 01/2017 48h Holter: Rare PAC/PVC. Freq runs of sinus tachycardia.   Past Surgical History:  Procedure Laterality Date  . KNEE SURGERY  1997, 1997   torn meniscus, ACL replacement  . REFRACTIVE SURGERY     lasix   Social History   Socioeconomic History  . Marital status: Married    Spouse name: None  . Number of children: 3  . Years of education: BS  . Highest education level: None  Social Needs  . Financial resource strain: None  . Food insecurity - worry: None  . Food insecurity - inability: None  . Transportation needs - medical: None  . Transportation needs - non-medical: None  Occupational History    Comment: [employer], Resp Therapist  Tobacco Use  . Smoking status: Never Smoker  . Smokeless tobacco: Never Used  Substance and Sexual Activity  . Alcohol use: Yes    Alcohol/week: 0.0 oz    Comment: rare  . Drug use: No  . Sexual activity: None  Other Topics Concern  . None  Social History  Narrative   Lives in [place] with husband and children.  Not currently exercising.  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   Caffeine 2-3 cups daily   Allergies  Allergen Reactions  . Zithromax [Azithromycin] Other (See Comments)    "convulsions or tremors"   Family History  Problem Relation Age of Onset  . Diabetes Mother   . Arthritis Father   . Other Father        atrial flutter  . Emphysema Father        smoker  . Diabetes Maternal Aunt   . Diabetes Maternal Uncle   . Stroke Paternal Aunt   . Arthritis Maternal Grandmother   . Diabetes Maternal Grandmother   . Cancer Maternal Grandmother        skin  . Arthritis Paternal Grandmother   . Cancer Paternal Grandmother        skin     Past medical history, social, surgical and family history all reviewed in electronic medical record.  No pertanent information unless stated regarding to the chief complaint.   Review of Systems:Review of systems updated and as accurate as of 03/29/17  No headache, visual changes, nausea, vomiting, diarrhea, constipation, dizziness, abdominal pain, skin rash, fevers, chills, night sweats, weight loss, swollen lymph nodes, body aches, joint swelling,  chest pain, shortness of breath, mood changes. +  muscle aches.   Objective  Blood pressure 100/70, pulse 81, height 5\' 5"  (1.651 m), weight 175 lb (79.4 kg), SpO2 98 %. Systems examined below as of 03/29/17   General: No apparent distress alert and oriented x3 mood and affect normal, dressed appropriately.  HEENT: Pupils equal, extraocular movements intact  Respiratory: Patient's speak in full sentences and does not appear short of breath  Cardiovascular: No lower extremity edema, non tender, no erythema  Skin: Warm dry intact with no signs of infection or rash on extremities or on axial skeleton.  Abdomen: Soft nontender  Neuro: Cranial nerves II through XII are intact, neurovascularly intact in all extremities with 2+ DTRs and 2+ pulses.  Lymph: No  lymphadenopathy of posterior or anterior cervical chain or axillae bilaterally.  Gait normal with good balance and coordination.  MSK:  Non tender with full range of motion and good stability and symmetric strength and tone of shoulders, elbows, wrist, hip, knee and ankles bilaterally.  Back Exam:  Inspection: Unremarkable  Motion: Flexion 45 deg, Extension 25 deg, Side Bending to 35 deg bilaterally,  Rotation to 45 deg bilaterally  SLR laying: Negative  XSLR laying: Negative  Palpable tenderness: Tender to palpation over the paraspinal musculature lumbar spine right greater than left. FABER: Mild tightness bilaterally. Sensory change: Gross sensation intact to all lumbar and sacral dermatomes.  Reflexes: 2+ at both patellar tendons, 2+ at achilles tendons, Babinski's downgoing.  Strength at foot  Plantar-flexion: 5/5 Dorsi-flexion: 5/5 Eversion: 5/5 Inversion: 5/5  Leg strength  Quad: 5/5 Hamstring: 5/5 Hip flexor: 5/5 Hip abductors: 5/5  Gait unremarkable.  Osteopathic findings C6 flexed rotated and side bent left T3 extended rotated and side bent right inhaled third rib T9 extended rotated and side bent left L2 flexed rotated and side bent right Sacrum right on right     Impression and Recommendations:     This case required medical decision making of moderate complexity.      Note: This dictation was prepared with Dragon dictation along with smaller phrase technology. Any transcriptional errors that result from this process are unintentional.

## 2017-03-29 ENCOUNTER — Ambulatory Visit (INDEPENDENT_AMBULATORY_CARE_PROVIDER_SITE_OTHER): Payer: 59 | Admitting: Family Medicine

## 2017-03-29 ENCOUNTER — Encounter: Payer: Self-pay | Admitting: Family Medicine

## 2017-03-29 DIAGNOSIS — G8929 Other chronic pain: Secondary | ICD-10-CM | POA: Diagnosis not present

## 2017-03-29 DIAGNOSIS — M999 Biomechanical lesion, unspecified: Secondary | ICD-10-CM | POA: Diagnosis not present

## 2017-03-29 DIAGNOSIS — M545 Low back pain, unspecified: Secondary | ICD-10-CM

## 2017-03-29 NOTE — Assessment & Plan Note (Signed)
Significant tightness with patient again that we can do further workup.  Patient has had a history of juvenile arthritis.  Discussed with patient about icing regimen, home exercises.  Patient has different medications for breakthrough.  Continue gabapentin.  Follow-up again in 4 weeks.

## 2017-03-29 NOTE — Assessment & Plan Note (Signed)
Decision today to treat with OMT was based on Physical Exam  After verbal consent patient was treated with HVLA, ME, FPR techniques in cervical, thoracic, lumbar and sacral areas  Patient tolerated the procedure well with improvement in symptoms  Patient given exercises, stretches and lifestyle modifications  See medications in patient instructions if given  Patient will follow up in 4-6 weeks 

## 2017-03-29 NOTE — Patient Instructions (Signed)
Good to see you  You will do well  Add tart cherry extract 1200mg  at night Keep doing everything else Consider massage See me again in 4 weeks

## 2017-04-06 ENCOUNTER — Ambulatory Visit: Payer: Self-pay | Admitting: Family Medicine

## 2017-04-06 DIAGNOSIS — Z0289 Encounter for other administrative examinations: Secondary | ICD-10-CM

## 2017-05-07 ENCOUNTER — Telehealth: Payer: Self-pay | Admitting: *Deleted

## 2017-05-07 NOTE — Telephone Encounter (Signed)
-----   Message from Penni Bombard, MD sent at 05/03/2017  5:58 PM EST ----- Unremarkable imaging results. Please call patient. Continue current plan. -VRP

## 2017-05-07 NOTE — Telephone Encounter (Signed)
LMVM for pt on mobile (ok per DPR) that MRI results are unremarkable, normal study.  Continue current plan.  Pt is to call if questions.

## 2017-05-07 NOTE — Progress Notes (Signed)
[facility] [address], [place] [address] Phone: [phone] Subjective:     CC: Back pain  OZH:[id number]  Sarah Gallagher is a 37 y.o. female coming in with complaint of back pain.  Has responded fairly well to osteopathic manipulation.  History of juvenile rheumatoid arthritis.  Patient states overall doing relatively well.  We discussed icing regimen and home exercises.  We discussed which activities to doing which wants to avoid.  Patient feels about 90% better.  Not having a severe pain.  No radiation down.  Seems to be more localized to the left lower back.     Past Medical History:  Diagnosis Date  . Arthritis   . Chest discomfort    a. associated with palpitations.  . Depression   . Dyspnea on exertion    a. 12/2016 Echo: EF 55-60%, no rwma, nl PASP.  Marland Kitchen Juvenile rheumatoid arthritis ([place])   . Meniere disease   . Migraines   . Palpitations   . PVC's (premature ventricular contractions)    a. 01/2017 48h Holter: Rare PAC/PVC. Freq runs of sinus tachycardia.   Past Surgical History:  Procedure Laterality Date  . KNEE SURGERY  1997, 1997   torn meniscus, ACL replacement  . REFRACTIVE SURGERY     lasix   Social History   Socioeconomic History  . Marital status: Married    Spouse name: None  . Number of children: 3  . Years of education: BS  . Highest education level: None  Social Needs  . Financial resource strain: None  . Food insecurity - worry: None  . Food insecurity - inability: None  . Transportation needs - medical: None  . Transportation needs - non-medical: None  Occupational History    Comment: [employer], Resp Therapist  Tobacco Use  . Smoking status: Never Smoker  . Smokeless tobacco: Never Used  Substance and Sexual Activity  . Alcohol use: Yes    Alcohol/week: 0.0 oz    Comment: rare  . Drug use: No  . Sexual activity: None  Other Topics Concern  . None  Social History Narrative   Lives in [place] with husband and children.  Not currently exercising.  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   Caffeine 2-3 cups daily   Allergies  Allergen Reactions  . Zithromax [Azithromycin] Other (See Comments)    "convulsions or tremors"   Family History  Problem Relation Age of Onset  . Diabetes Mother   . Arthritis Father   . Other Father        atrial flutter  . Emphysema Father        smoker  . Diabetes Maternal Aunt   . Diabetes Maternal Uncle   . Stroke Paternal Aunt   . Arthritis Maternal Grandmother   . Diabetes Maternal Grandmother   . Cancer Maternal Grandmother        skin  . Arthritis Paternal Grandmother   . Cancer Paternal Grandmother        skin     Past medical history, social, surgical and family history all reviewed in electronic medical record.  No pertanent information unless stated regarding to the chief complaint.   Review of Systems:Review of systems updated and as accurate as of 05/08/17  No headache, visual changes, nausea, vomiting, diarrhea, constipation, dizziness, abdominal pain, skin rash, fevers, chills, night sweats, weight loss, swollen lymph nodes, body aches, joint swelling, muscle aches, chest pain, shortness of breath, mood changes.  Objective  Blood pressure 118/64, pulse 79, height 5\' 6"  (1.676 m), weight 171 lb (77.6 kg), SpO2 99 %. Systems examined below as of 05/08/17   General: No apparent distress alert and oriented x3 mood and affect normal, dressed appropriately.  HEENT: Pupils equal, extraocular movements intact  Respiratory: Patient's speak in full sentences and does not appear short of breath  Cardiovascular: No lower extremity edema, non tender, no erythema  Skin: Warm dry intact with no signs of infection or rash on extremities or on axial skeleton.  Abdomen: Soft nontender  Neuro: Cranial nerves II through XII are intact, neurovascularly intact in all extremities with 2+ DTRs and 2+ pulses.  Lymph: No lymphadenopathy of posterior  or anterior cervical chain or axillae bilaterally.  Gait normal with good balance and coordination.  MSK:  Non tender with full range of motion and good stability and symmetric strength and tone of shoulders, elbows, wrist, hip, knee and ankles bilaterally.  Back Exam:  Inspection: Mild loss of lordosis Motion: Flexion 35 deg, Extension 25 deg, Side Bending to 35 deg bilaterally,  Rotation to 40 deg bilaterally  SLR laying: Negative  XSLR laying: Negative  Palpable tenderness: Tender to palpation in the paraspinal musculature lumbar spine only on the left side.  Seems to be over the iliolumbar ligament at L4-L5. FABER: negative. Sensory change: Gross sensation intact to all lumbar and sacral dermatomes.  Reflexes: 2+ at both patellar tendons, 2+ at achilles tendons, Babinski's downgoing.  Strength at foot  Plantar-flexion: 5/5 Dorsi-flexion: 5/5 Eversion: 5/5 Inversion: 5/5  Leg strength  Quad: 5/5 Hamstring: 5/5 Hip flexor: 5/5 Hip abductors: 5/5  Gait unremarkable.  Osteopathic findings C2 flexed rotated and side bent right C4 flexed rotated and side bent left T4 extended rotated and side bent right inhaled rib T6 extended rotated and side bent left L2 flexed rotated and side bent right Sacrum right on right     Impression and Recommendations:     This case required medical decision making of moderate complexity.      Note: This dictation was prepared with Dragon dictation along with smaller phrase technology. Any transcriptional errors that result from this process are unintentional.

## 2017-05-08 ENCOUNTER — Ambulatory Visit: Payer: 59 | Admitting: Family Medicine

## 2017-05-08 ENCOUNTER — Encounter: Payer: Self-pay | Admitting: Family Medicine

## 2017-05-08 VITALS — BP 118/64 | HR 79 | Ht 66.0 in | Wt 171.0 lb

## 2017-05-08 DIAGNOSIS — M999 Biomechanical lesion, unspecified: Secondary | ICD-10-CM | POA: Diagnosis not present

## 2017-05-08 DIAGNOSIS — M545 Low back pain, unspecified: Secondary | ICD-10-CM

## 2017-05-08 DIAGNOSIS — G8929 Other chronic pain: Secondary | ICD-10-CM

## 2017-05-08 NOTE — Assessment & Plan Note (Signed)
Doing remarkably well at this time.  We discussed icing regimen and home exercise.  We discussed which activities to doing which wants to avoid.  Continues to respond well to osteopathic manipulation.  Follow-up again in 6-8 weeks.

## 2017-05-08 NOTE — Assessment & Plan Note (Signed)
Decision today to treat with OMT was based on Physical Exam  After verbal consent patient was treated with HVLA, ME, FPR techniques in cervical, thoracic, lumbar and sacral areas  Patient tolerated the procedure well with improvement in symptoms  Patient given exercises, stretches and lifestyle modifications  See medications in patient instructions if given  Patient will follow up in 6-8 weeks 

## 2017-05-08 NOTE — Patient Instructions (Signed)
Good to see you Ice is your friend.  I am impressed overall  See me again in 6-8 weeks  

## 2017-07-05 ENCOUNTER — Encounter: Payer: Self-pay | Admitting: Family Medicine

## 2017-07-05 ENCOUNTER — Ambulatory Visit (INDEPENDENT_AMBULATORY_CARE_PROVIDER_SITE_OTHER): Payer: 59 | Admitting: Family Medicine

## 2017-07-05 VITALS — BP 120/88 | HR 78 | Ht 66.0 in | Wt 171.0 lb

## 2017-07-05 DIAGNOSIS — M545 Low back pain, unspecified: Secondary | ICD-10-CM

## 2017-07-05 DIAGNOSIS — G8929 Other chronic pain: Secondary | ICD-10-CM | POA: Diagnosis not present

## 2017-07-05 DIAGNOSIS — M2550 Pain in unspecified joint: Secondary | ICD-10-CM | POA: Diagnosis not present

## 2017-07-05 MED ORDER — METHYLPREDNISOLONE ACETATE 80 MG/ML IJ SUSP
80.0000 mg | Freq: Once | INTRAMUSCULAR | Status: AC
  Administered 2017-07-05: 80 mg via INTRAMUSCULAR

## 2017-07-05 MED ORDER — PREDNISONE 50 MG PO TABS: 50.0000 mg | ORAL_TABLET | Freq: Every day | ORAL | 0 refills | Status: DC

## 2017-07-05 MED ORDER — KETOROLAC TROMETHAMINE 60 MG/2ML IM SOLN
60.0000 mg | Freq: Once | INTRAMUSCULAR | Status: AC
  Administered 2017-07-05: 60 mg via INTRAMUSCULAR

## 2017-07-05 NOTE — Progress Notes (Signed)
[name] [address] [place], [place] [address] Phone: [phone] Subjective:    I'm seeing this patient by the request  of:    CC: Back pain  [id number]  Sarah Gallagher is a 37 y.o. female coming in with complaint of back pain for 1 week. She has a hard time getting up from a chair and walking. She is having back spasm that are intermittent. The past two days have been the worse. Pain radiates down into the left hamstring. Patient continues to have pain over the GT.  Patient states that the pain in the back seems to be significantly worse and is having radiation down the leg.  Patient states that it is severe.  8 out of 10.  Not responding to over-the-counter medications.      Past Medical History:  Diagnosis Date  . Arthritis   . Chest discomfort    a. associated with palpitations.  . Depression   . Dyspnea on exertion    a. 12/2016 Echo: EF 55-60%, no rwma, nl PASP.  Marland Kitchen Juvenile rheumatoid arthritis ([place])   . Meniere disease   . Migraines   . Palpitations   . PVC's (premature ventricular contractions)    a. 01/2017 48h Holter: Rare PAC/PVC. Freq runs of sinus tachycardia.   Past Surgical History:  Procedure Laterality Date  . KNEE SURGERY  1997, 1997   torn meniscus, ACL replacement  . REFRACTIVE SURGERY     lasix   Social History   Socioeconomic History  . Marital status: Married    Spouse name: Not on file  . Number of children: 3  . Years of education: BS  . Highest education level: Not on file  Occupational History    Comment: [employer], Resp Therapist  Social Needs  . Financial resource strain: Not on file  . Food insecurity:    Worry: Not on file    Inability: Not on file  . Transportation needs:    Medical: Not on file    Non-medical: Not on file  Tobacco Use  . Smoking status: Never Smoker  . Smokeless tobacco: Never Used  Substance and Sexual Activity  . Alcohol use: Yes    Alcohol/week: 0.0 oz     Comment: rare  . Drug use: No  . Sexual activity: Not on file  Lifestyle  . Physical activity:    Days per week: Not on file    Minutes per session: Not on file  . Stress: Not on file  Relationships  . Social connections:    Talks on phone: Not on file    Gets together: Not on file    Attends religious service: Not on file    Active member of club or organization: Not on file    Attends meetings of clubs or organizations: Not on file    Relationship status: Not on file  Other Topics Concern  . Not on file  Social History Narrative   Lives in [place] with husband and children.  Not currently exercising.  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   Caffeine 2-3 cups daily   Allergies  Allergen Reactions  . Zithromax [Azithromycin] Other (See Comments)    "convulsions or tremors"   Family History  Problem Relation Age of Onset  . Diabetes Mother   . Arthritis Father   . Other Father        atrial flutter  . Emphysema Father  smoker  . Diabetes Maternal Aunt   . Diabetes Maternal Uncle   . Stroke Paternal Aunt   . Arthritis Maternal Grandmother   . Diabetes Maternal Grandmother   . Cancer Maternal Grandmother        skin  . Arthritis Paternal Grandmother   . Cancer Paternal Grandmother        skin     Past medical history, social, surgical and family history all reviewed in electronic medical record.  No pertanent information unless stated regarding to the chief complaint.   Review of Systems:Review of systems updated and as accurate as of 07/05/17  No headache, visual changes, nausea, vomiting, diarrhea, constipation, dizziness, abdominal pain, skin rash, fevers, chills, night sweats, weight loss, swollen lymph nodes, body aches, joint swelling, chest pain, shortness of breath, mood changes.  Positive muscle aches and muscle spasms  Objective  Blood pressure 120/88, pulse 78, height 5\' 6"  (1.676 m), weight 171 lb (77.6 kg), SpO2 99 %. Systems examined below as of 07/05/17     General: No apparent distress alert and oriented x3 mood and affect normal, dressed appropriately.  HEENT: Pupils equal, extraocular movements intact  Respiratory: Patient's speak in full sentences and does not appear short of breath  Cardiovascular: No lower extremity edema, non tender, no erythema  Skin: Warm dry intact with no signs of infection or rash on extremities or on axial skeleton.  Abdomen: Soft nontender  Neuro: Cranial nerves II through XII are intact, neurovascularly intact in all extremities with 2+ DTRs and 2+ pulses.  Lymph: No lymphadenopathy of posterior or anterior cervical chain or axillae bilaterally.  Gait normal with good balance and coordination.  MSK:  Non tender with full range of motion and good stability and symmetric strength and tone of shoulders, elbows, wrist, hip, knee and ankles bilaterally.  Back exam shows that patient does have significant tightness.  Patient has forward flexion of 30 degrees, extension to 5 degrees, minimal rotation bilaterally.  Side bending to 15 degrees.  Positive Faber bilaterally.  Negative straight leg test but severe tightness in the left hamstring at 20 degrees.  Back spasm palpated in the paraspinal musculature on the left side   Impression and Recommendations:     This case required medical decision making of moderate complexity.      Note: This dictation was prepared with Dragon dictation along with smaller phrase technology. Any transcriptional errors that result from this process are unintentional.

## 2017-07-05 NOTE — Patient Instructions (Addendum)
Good to see you  Injected you with 2 med.s Prednisone daily for 5 days  Increase gabapentin to 300mg  at night See me again in 1 week (ok to double book before 330)

## 2017-07-05 NOTE — Assessment & Plan Note (Signed)
Severe low back pain with no radiation.  Questionable positive straight leg test.  No significant weakness of the lower extremities noted today though.  Patient given a shot of Toradol and Depo-Medrol.  Given prednisone and encouraged to increase gabapentin short-term at night.  Has responded well to osteopathic manipulation previously.  Discussed icing regimen and home exercises.  Patient will follow up with me again in 1-2 weeks for further evaluation.

## 2017-07-11 NOTE — Progress Notes (Signed)
[name] [address] [place], [place] [address] Phone: [phone] Subjective:    I'm seeing this patient by the request  of:    CC: Back pain follow-up  HWE:[id number]  Sarah Gallagher is a 37 y.o. female coming in with complaint of lower back pain. The injection did help the piriformis. She is having pain in her neck now and she states that the muscles are tense.   Patient since then unfortunately patient continues to have pain all over.  Feels like it is out of proportion.  States that it seems to be worsening.  Patient is concerned that something is truly wrong.  Patient did have x-rays at last exam showed no significant bony abnormality.  This was independently visualized by me.    Past Medical History:  Diagnosis Date  . Arthritis   . Chest discomfort    a. associated with palpitations.  . Depression   . Dyspnea on exertion    a. 12/2016 Echo: EF 55-60%, no rwma, nl PASP.  Marland Kitchen Juvenile rheumatoid arthritis ([place])   . Meniere disease   . Migraines   . Palpitations   . PVC's (premature ventricular contractions)    a. 01/2017 48h Holter: Rare PAC/PVC. Freq runs of sinus tachycardia.   Past Surgical History:  Procedure Laterality Date  . KNEE SURGERY  1997, 1997   torn meniscus, ACL replacement  . REFRACTIVE SURGERY     lasix   Social History   Socioeconomic History  . Marital status: Married    Spouse name: Not on file  . Number of children: 3  . Years of education: BS  . Highest education level: Not on file  Occupational History    Comment: [employer], Resp Therapist  Social Needs  . Financial resource strain: Not on file  . Food insecurity:    Worry: Not on file    Inability: Not on file  . Transportation needs:    Medical: Not on file    Non-medical: Not on file  Tobacco Use  . Smoking status: Never Smoker  . Smokeless tobacco: Never Used  Substance and Sexual Activity  . Alcohol use: Yes    Alcohol/week:  0.0 oz    Comment: rare  . Drug use: No  . Sexual activity: Not on file  Lifestyle  . Physical activity:    Days per week: Not on file    Minutes per session: Not on file  . Stress: Not on file  Relationships  . Social connections:    Talks on phone: Not on file    Gets together: Not on file    Attends religious service: Not on file    Active member of club or organization: Not on file    Attends meetings of clubs or organizations: Not on file    Relationship status: Not on file  Other Topics Concern  . Not on file  Social History Narrative   Lives in [place] with husband and children.  Not currently exercising.  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 Zithromax [Azithromycin] Other (See Comments)    "convulsions or tremors"   Family History  Problem Relation Age of Onset  . Diabetes Mother   . Arthritis Father   . Other Father        atrial flutter  . Emphysema Father        smoker  . Diabetes  Maternal Aunt   . Diabetes Maternal Uncle   . Stroke Paternal Aunt   . Arthritis Maternal Grandmother   . Diabetes Maternal Grandmother   . Cancer Maternal Grandmother        skin  . Arthritis Paternal Grandmother   . Cancer Paternal Grandmother        skin     Past medical history, social, surgical and family history all reviewed in electronic medical record.  No pertanent information unless stated regarding to the chief complaint.   Review of Systems:Review of systems updated and as accurate as of 07/12/17  No headache, visual changes, nausea, vomiting, diarrhea, constipation, dizziness, abdominal pain, skin rash, fevers, chills, night sweats, weight loss, swollen lymph nodes, , joint swelling,chest pain, shortness of breath, mood changes.  Positive muscle aches, body aches  Objective  Blood pressure 130/84, pulse 77, height 5\' 6"  (1.676 m), weight 174 lb (78.9 kg), SpO2 98 %. Systems examined below as of 07/12/17   General:  No apparent distress alert and oriented x3 mood and affect normal, dressed appropriately.  HEENT: Pupils equal, extraocular movements intact  Respiratory: Patient's speak in full sentences and does not appear short of breath  Cardiovascular: No lower extremity edema, non tender, no erythema  Skin: Warm dry intact with no signs of infection or rash on extremities or on axial skeleton.  Abdomen: Soft nontender  Neuro: Cranial nerves II through XII are intact, neurovascularly intact in all extremities with 2+ DTRs and 2+ pulses.  Lymph: No lymphadenopathy of posterior or anterior cervical chain or axillae bilaterally.  Gait normal with good balance and coordination.  MSK: Patient has full range of motion but does have significant tenderness of multiple different musculoskeletal complaints.  Patient also has been complaining of pain in most joints.  Osteopathic findings  T3 extended rotated and side bent right inhaled third rib L2 flexed rotated and side bent right Sacrum right on right       Impression and Recommendations:     This case required medical decision making of moderate complexity.      Note: This dictation was prepared with Dragon dictation along with smaller phrase technology. Any transcriptional errors that result from this process are unintentional.

## 2017-07-12 ENCOUNTER — Encounter: Payer: Self-pay | Admitting: Family Medicine

## 2017-07-12 ENCOUNTER — Ambulatory Visit (INDEPENDENT_AMBULATORY_CARE_PROVIDER_SITE_OTHER): Payer: 59 | Admitting: Family Medicine

## 2017-07-12 ENCOUNTER — Other Ambulatory Visit (INDEPENDENT_AMBULATORY_CARE_PROVIDER_SITE_OTHER): Payer: Self-pay

## 2017-07-12 VITALS — BP 130/84 | HR 77 | Ht 66.0 in | Wt 174.0 lb

## 2017-07-12 DIAGNOSIS — M2550 Pain in unspecified joint: Secondary | ICD-10-CM | POA: Diagnosis not present

## 2017-07-12 DIAGNOSIS — M791 Myalgia, unspecified site: Secondary | ICD-10-CM | POA: Diagnosis not present

## 2017-07-12 DIAGNOSIS — M999 Biomechanical lesion, unspecified: Secondary | ICD-10-CM | POA: Diagnosis not present

## 2017-07-12 LAB — COMPREHENSIVE METABOLIC PANEL
ALT: 11 U/L (ref 0–35)
AST: 9 U/L (ref 0–37)
Albumin: 4.1 g/dL (ref 3.5–5.2)
Alkaline Phosphatase: 44 U/L (ref 39–117)
BILIRUBIN TOTAL: 0.3 mg/dL (ref 0.2–1.2)
BUN: 19 mg/dL (ref 6–23)
CO2: 26 meq/L (ref 19–32)
Calcium: 8.9 mg/dL (ref 8.4–10.5)
Chloride: 108 mEq/L (ref 96–112)
Creatinine, Ser: 1.04 mg/dL (ref 0.40–1.20)
GFR: 62.94 mL/min (ref >60.00)
GLUCOSE: 90 mg/dL (ref 70–99)
Potassium: 3.8 mEq/L (ref 3.5–5.1)
SODIUM: 140 meq/L (ref 135–145)
Total Protein: 6.7 g/dL (ref 6.0–8.3)

## 2017-07-12 LAB — CBC WITH DIFFERENTIAL/PLATELET
BASOS ABS: 0.1 10*3/uL (ref 0.0–0.1)
Basophils Relative: 1 % (ref 0.0–3.0)
EOS ABS: 0.1 10*3/uL (ref 0.0–0.7)
Eosinophils Relative: 1.3 % (ref 0.0–5.0)
HCT: 38.6 % (ref 36.0–46.0)
Hemoglobin: 12.8 g/dL (ref 12.0–15.0)
LYMPHS ABS: 3 10*3/uL (ref 0.7–4.0)
Lymphocytes Relative: 36.2 % (ref 12.0–46.0)
MCHC: 33.2 g/dL (ref 30.0–36.0)
MCV: 91 fl (ref 78.0–100.0)
MONO ABS: 0.6 10*3/uL (ref 0.1–1.0)
Monocytes Relative: 7.8 % (ref 3.0–12.0)
NEUTROS ABS: 4.4 10*3/uL (ref 1.4–7.7)
NEUTROS PCT: 53.7 % (ref 43.0–77.0)
PLATELETS: 249 10*3/uL (ref 150.0–400.0)
RBC: 4.24 Mil/uL (ref 3.87–5.11)
RDW: 14.3 % (ref 11.5–15.5)
WBC: 8.2 10*3/uL (ref 4.0–10.5)

## 2017-07-12 LAB — IBC PANEL
Iron: 37 ug/dL — ABNORMAL LOW (ref 42–145)
Saturation Ratios: 12.8 % — ABNORMAL LOW (ref 20.0–50.0)
TRANSFERRIN: 207 mg/dL — AB (ref 212.0–360.0)

## 2017-07-12 LAB — VITAMIN D 25 HYDROXY (VIT D DEFICIENCY, FRACTURES): VITD: 21.11 ng/mL — ABNORMAL LOW (ref 30.00–100.00)

## 2017-07-12 LAB — C-REACTIVE PROTEIN: CRP: 0.1 mg/dL — ABNORMAL LOW (ref 0.5–20.0)

## 2017-07-12 LAB — SEDIMENTATION RATE: SED RATE: 19 mm/h (ref 0–20)

## 2017-07-12 LAB — URIC ACID: URIC ACID, SERUM: 4.5 mg/dL (ref 2.4–7.0)

## 2017-07-12 LAB — TSH: TSH: 2.56 u[IU]/mL (ref 0.35–4.50)

## 2017-07-12 NOTE — Assessment & Plan Note (Addendum)
Decision today to treat with OMT was based on Physical Exam  After verbal consent patient was treated with HVLA, ME, FPR techniques in , thoracic, lumbar and sacral areas  Patient tolerated the procedure well with improvement in symptoms  Patient given exercises, stretches and lifestyle modifications  See medications in patient instructions if given  Patient will follow up in 3-4 weeks

## 2017-07-12 NOTE — Assessment & Plan Note (Signed)
Spent  25 minutes with patient face-to-face and had greater than 50% of counseling including as described above in assessment and plan. Severe polyarthralgia noted.  Discussed with patient in great length about icing regimen, home exercise, which activities of doing which wants to avoid.  Patient does respond fairly well to osteopathic manipulation.  Do feel that laboratory workup is necessary at this time.  Depending on the laboratory workup this could change medical management.  Follow-up again 3-4 weeks

## 2017-07-13 ENCOUNTER — Encounter: Payer: Self-pay | Admitting: Family Medicine

## 2017-07-13 ENCOUNTER — Telehealth: Payer: Self-pay | Admitting: Family Medicine

## 2017-07-13 NOTE — Telephone Encounter (Signed)
Copied from [place] [id number]. Topic: General - Other >> Jul 13, 2017 11:25 AM Darl Householder, RMA wrote: Reason for CRM: pt is requesting a call back concerning prescription for Effexor that was discussed in office visit, it is not at the pharmacy as stated

## 2017-07-16 ENCOUNTER — Telehealth: Payer: Self-pay | Admitting: Family Medicine

## 2017-07-16 ENCOUNTER — Other Ambulatory Visit: Payer: Self-pay

## 2017-07-16 LAB — ANGIOTENSIN CONVERTING ENZYME: Angiotensin-Converting Enzyme: 22 U/L (ref 9–67)

## 2017-07-16 LAB — RHEUMATOID FACTOR: Rhuematoid fact SerPl-aCnc: <14 IU/mL (ref <14)

## 2017-07-16 LAB — PTH, INTACT AND CALCIUM
Calcium: 9 mg/dL (ref 8.6–10.2)
PTH: 30 pg/mL (ref 14–64)

## 2017-07-16 LAB — ANA: Anti Nuclear Antibody(ANA): NEGATIVE

## 2017-07-16 LAB — RPR: RPR Ser Ql: NONREACTIVE

## 2017-07-16 LAB — CYCLIC CITRUL PEPTIDE ANTIBODY, IGG: CYCLIC CITRULLIN PEPTIDE AB: 110 U — AB

## 2017-07-16 LAB — CALCIUM, IONIZED: CALCIUM ION: 4.9 mg/dL (ref 4.8–5.6)

## 2017-07-16 MED ORDER — VENLAFAXINE HCL ER 37.5 MG PO CP24: 37.5000 mg | ORAL_CAPSULE | Freq: Every day | ORAL | 1 refills | Status: DC

## 2017-07-16 MED ORDER — VITAMIN D (ERGOCALCIFEROL) 1.25 MG (50000 UNIT) PO CAPS: 50000.0000 [IU] | ORAL_CAPSULE | ORAL | 0 refills | Status: DC

## 2017-07-16 NOTE — Telephone Encounter (Signed)
Copied from [place] [phone]. Topic: General - Other >> Jul 13, 2017 11:25 AM Darl Householder, RMA wrote: Reason for CRM: pt is requesting a call back concerning prescription for Effexor that was discussed in office visit, it is not at the pharmacy as stated   Pt called again today  To inquiry on rx's for effexor and vitamin D Dr. Tamala Julian was to send to [facility]  Employee pharmacy.

## 2017-07-16 NOTE — Telephone Encounter (Signed)
Spoke to pt, resent rx into pharmacy.

## 2017-07-16 NOTE — Telephone Encounter (Signed)
Spoke with patient that Dr. Tamala Julian will call in rx for effexor and vitamin d.

## 2017-08-01 ENCOUNTER — Ambulatory Visit: Payer: Self-pay | Admitting: Family Medicine

## 2017-09-24 ENCOUNTER — Ambulatory Visit: Payer: 59 | Admitting: Diagnostic Neuroimaging

## 2017-09-24 ENCOUNTER — Telehealth: Payer: Self-pay | Admitting: Family Medicine

## 2017-09-24 NOTE — Telephone Encounter (Signed)
Copied from [place] [id number]. Topic: Quick Communication - Rx Refill/Question >> Sep 24, 2017 10:58 AM Bea Graff, NT wrote: Medication: Vitamin D, Ergocalciferol, (DRISDOL) 50000 units CAPS capsule  Has the patient contacted their pharmacy? Yes.   (Agent: If no, request that the patient contact the pharmacy for the refill.) (Agent: If yes, when and what did the pharmacy advise?)  Preferred Pharmacy (with phone number or street name): [facility], [place] [phone] (Phone) [phone] (Fax)      Agent: Please be advised that RX refills may take up to 3 business days. We ask that you follow-up with your pharmacy.

## 2017-09-25 NOTE — Telephone Encounter (Signed)
This medication was last filled by [facility]. Patient has no scheduled appt with Dr. Caryl Bis.

## 2017-10-23 DIAGNOSIS — R87612 Low grade squamous intraepithelial lesion on cytologic smear of cervix (LGSIL): Secondary | ICD-10-CM | POA: Diagnosis not present

## 2017-11-05 ENCOUNTER — Telehealth: Payer: Self-pay

## 2017-11-05 NOTE — Telephone Encounter (Signed)
She should have her vitamin D rechecked prior to considering additional vitamin D supplementation.  This potentially could be done at her visit tomorrow.

## 2017-11-05 NOTE — Telephone Encounter (Signed)
Patient was given vitamin D 50,000 units by Waldon Merl in April and has completed the prescription and is wondering if she needs to have her vitamin D rechecked or if she needs a new rx Patient states she is experiencing a lot of anxiety and depression for the last 3 weeks. Patient denied any thoughts of hurting herself or others. I have scheduled patient with Philis Nettle NP per DR.Sonnenbergs approval. Informed patient that if her symptoms worsen and she has any thought of harming herself or others she will need to be evaluated right away. Patient verbalized understanding.

## 2017-11-05 NOTE — Telephone Encounter (Signed)
Copied from [place] [id number]. Topic: Quick Communication - Rx Refill/Question >> Sep 24, 2017 10:58 AM Bea Graff, NT wrote: Medication: Vitamin D, Ergocalciferol, (DRISDOL) 50000 units CAPS capsule  Has the patient contacted their pharmacy? Yes.   (Agent: If no, request that the patient contact the pharmacy for the refill.) (Agent: If yes, when and what did the pharmacy advise?)  Preferred Pharmacy (with phone number or street name): [place], [place] [phone] (Phone) [phone] (Fax)      Agent: Please be advised that RX refills may take up to 3 business days. We ask that you follow-up with your pharmacy. >> Nov 05, 2017 10:59 AM Carolyn Stare wrote: Patient called in to make an appointment with Dr Caryl Bis to get this re-filled and wants to talk about some depression issues that she is having. She thinks that it may be coming from this medication, she just is not sure. The next available appointment is not until 10/9. Patient said she can not wait that long and would like to be worked in sooner. Patient is requesting a call back as soon as possible. [phone]

## 2017-11-06 ENCOUNTER — Encounter: Payer: Self-pay | Admitting: Family Medicine

## 2017-11-06 ENCOUNTER — Ambulatory Visit: Payer: 59 | Admitting: Family Medicine

## 2017-11-06 VITALS — BP 112/80 | HR 70 | Temp 98.5°F | Wt 166.2 lb

## 2017-11-06 DIAGNOSIS — F419 Anxiety disorder, unspecified: Secondary | ICD-10-CM

## 2017-11-06 DIAGNOSIS — E559 Vitamin D deficiency, unspecified: Secondary | ICD-10-CM

## 2017-11-06 DIAGNOSIS — R0602 Shortness of breath: Secondary | ICD-10-CM

## 2017-11-06 DIAGNOSIS — M791 Myalgia, unspecified site: Secondary | ICD-10-CM

## 2017-11-06 DIAGNOSIS — F32A Depression, unspecified: Secondary | ICD-10-CM

## 2017-11-06 DIAGNOSIS — F329 Major depressive disorder, single episode, unspecified: Secondary | ICD-10-CM

## 2017-11-06 MED ORDER — HYDROXYZINE HCL 10 MG PO TABS: 10.0000 mg | ORAL_TABLET | Freq: Three times a day (TID) | ORAL | 2 refills | Status: DC | PRN

## 2017-11-06 MED ORDER — FLUOXETINE HCL 20 MG PO TABS: 20.0000 mg | ORAL_TABLET | Freq: Every day | ORAL | 3 refills | Status: DC

## 2017-11-06 MED ORDER — ALBUTEROL SULFATE HFA 108 (90 BASE) MCG/ACT IN AERS: 1.0000 | INHALATION_SPRAY | Freq: Four times a day (QID) | RESPIRATORY_TRACT | 2 refills | Status: DC | PRN

## 2017-11-06 NOTE — Patient Instructions (Signed)
Great to meet you!  Follow up in 3 weeks to see how new medication is going

## 2017-11-06 NOTE — Progress Notes (Signed)
Subjective:    Patient ID: Sarah Gallagher, female    DOB: 1980-04-19, 37 y.o.   MRN: [id number]  HPI  Presents to clinic for follow up on anxiety and depression & vitamin D check.   She has a history of low Vit D and was taking 50,000 units weekly - she noticed while taking this med her left hip pain (from hip injury) and other joint aches improved. She has noticed since being off of the Vit D that the pain is slowly creeping back.  States over the past few years she has noticed her depression and anxiety slowly becoming worse. She did have post partum depression with all of her children, took Prozac with 1st child and Lexapro with next child - took each of these for about a year, then weaned off. Notes she did feel the Prozac was helpful.   She had been prescribed Effexor earlier this year, but states she did not end up taking.  Reports her life has been increasingly stressful over the past 2 years - her husband lost his job and there was also infidelity in the marriage. Patient's husband is still not working, so this puts a lot of pressure on patient to pick up extra shifts to make sure all the bills are paid. Patient is also trying to help husband get into truck driving school so he can have a good career, but he has some health issues that are hindering his acceptance into the school. Patient has contemplated leaving her husband, but worries about leaving her children with him because he has had mental health issues in the past. Patient is trying her best to keep the family together and keep them afloat financially -- this has been extremely stressful. She can feel anxiety coming on with a "heaviness on chest". She has been carrying a marble in her pocket - and will rub this when starting to feel anxious which has been helpful in calming her down.    Requests refill on inhaler -- uses on occasion with SOB  Patient Active Problem List   Diagnosis Date Noted  . Greater trochanteric bursitis  of left hip 01/31/2017  . Low back pain 01/31/2017  . Nonallopathic lesion of lumbosacral region 01/31/2017  . Nonallopathic lesion of sacral region 01/31/2017  . Nonallopathic lesion of thoracic region 01/31/2017  . Dyspnea on exertion 12/18/2016  . Lower abdominal pain 12/18/2016  . Myalgia 12/18/2016  . Palpitations 12/18/2016  . Headache 12/18/2016  . History of occupational exposure to risk factor 12/18/2016    Social History   Tobacco Use  . Smoking status: Never Smoker  . Smokeless tobacco: Never Used  Substance Use Topics  . Alcohol use: Yes    Alcohol/week: 0.0 oz    Comment: rare   Review of Systems  Constitutional: Positive for fatigue. Negative for chills and fever.       Feels tiredness is related to all the stress in her life & working 5-6 twelve hour shifts a week.   HENT: Negative.   Eyes: Negative.   Respiratory: Negative for cough, shortness of breath and wheezing.   Cardiovascular: Negative for chest pain, palpitations and leg swelling.       Describes anxiety as a heaviness on chest, states "I know it is not cardiac because when I calm down it is better"  Gastrointestinal: Negative for abdominal pain, diarrhea, nausea and vomiting.  Genitourinary: Negative.   Musculoskeletal: Positive for arthralgias and myalgias.  Generalized muscle and joint aches - left hip from old injury.  Skin: Negative for color change, pallor and rash.  Psychiatric/Behavioral: Positive for sleep disturbance. Negative for suicidal ideas. The patient is nervous/anxious.        Increased stress, anxiety, depression      Objective:   Physical Exam  Constitutional: She is oriented to person, place, and time. She appears well-developed and well-nourished. No distress.  HENT:  Head: Normocephalic and atraumatic.  Eyes: Pupils are equal, round, and reactive to light. EOM are normal. No scleral icterus.  Neck: Normal range of motion. No tracheal deviation present.    Cardiovascular: Normal rate, regular rhythm and normal heart sounds.  Pulmonary/Chest: Effort normal and breath sounds normal. No respiratory distress. She has no wheezes. She has no rales.  Musculoskeletal: Normal range of motion. She exhibits no deformity.  Neurological: She is alert and oriented to person, place, and time. Coordination normal.  Gait normal  Skin: Skin is warm and dry. No pallor.  Psychiatric: She has a normal mood and affect. Her behavior is normal. Judgment and thought content normal.  Tearful when talking about increased stress related to her husband, having to work all the time  Nursing note and vitals reviewed.     Assessment & Plan:   A total of 25 minutes were spent face-to-face with the patient during this encounter and over half of that time was spent on counseling and coordination of care. The patient was counseled on strategies to better manage her anxiety and depression.   Anxiety & Depression -- Patient will start Prozac 20mg  daily and use hydroxyzine 10 mg as needed for break through anxiety. Advised her idea of carrying a marble in pocket and rubbing it as a way to calm down is a great tool to manage anxiety. Also can do deep breathing exercises. Patient is open to counseling, but currently her work schedule will not allow this.   Vit D Deficiency - Vit D levels checked today. Will restart Vit D supplement if needed.   Myalgias - Discussed stretching and ROM exercises she can do to loosen up. She would like to go to a gym but her work schedule is too busy  SOB - Inhaler refill given  Follow up in 3-4 weeks for recheck on Anxiety and Depression after starting medications.

## 2017-11-06 NOTE — Telephone Encounter (Signed)
Will inform patient at her appointment

## 2017-11-07 ENCOUNTER — Encounter: Payer: Self-pay | Admitting: Family Medicine

## 2017-11-07 LAB — VITAMIN D 25 HYDROXY (VIT D DEFICIENCY, FRACTURES): VITD: 47.92 ng/mL (ref 30.00–100.00)

## 2017-11-07 MED ORDER — VITAMIN D 50 MCG (2000 UT) PO TABS: 2000.0000 [IU] | ORAL_TABLET | Freq: Every day | ORAL | 1 refills | Status: DC

## 2017-11-07 NOTE — Addendum Note (Signed)
Addended by: Philis Nettle on: 11/07/2017 12:10 PM   Modules accepted: Orders

## 2017-11-27 ENCOUNTER — Encounter: Payer: Self-pay | Admitting: Family Medicine

## 2017-11-27 ENCOUNTER — Ambulatory Visit: Payer: 59 | Admitting: Family Medicine

## 2017-11-27 DIAGNOSIS — F419 Anxiety disorder, unspecified: Secondary | ICD-10-CM | POA: Diagnosis not present

## 2017-11-27 DIAGNOSIS — F32A Depression, unspecified: Secondary | ICD-10-CM | POA: Insufficient documentation

## 2017-11-27 DIAGNOSIS — F329 Major depressive disorder, single episode, unspecified: Secondary | ICD-10-CM | POA: Insufficient documentation

## 2017-11-27 NOTE — Assessment & Plan Note (Signed)
Stable.  She will continue Prozac.  Discussed that it may take up to 8 weeks for her to notice a difference on the Prozac.  She will discontinue the hydroxyzine.  Offered referral to a counselor though she deferred.  She will monitor her symptoms.  Given return precautions.

## 2017-11-27 NOTE — Progress Notes (Signed)
  Tommi Rumps, MD Phone: [phone]  Sarah Gallagher is a 37 y.o. female who presents today for f/u.  CC: anxiety/depression  Anxiety/depression: Patient seen about 3 weeks ago by FNP and started on Prozac.  She has been having a lot issues in her family life that have been difficult to deal with.  She does note a history of postpartum depression.  She has been on Prozac and notes she is not any better though is not any worse.  She tried the hydroxyzine for anxiety though that made her drowsy.  She has seen a counselor before though does not have the time currently with her work schedule.  She notes no SI or HI.  Social History   Tobacco Use  Smoking Status Never Smoker  Smokeless Tobacco Never Used     ROS see history of present illness  Objective  Physical Exam Vitals:   11/27/17 1544  BP: 90/62  Pulse: 67  Temp: 98.3 F (36.8 C)  SpO2: 96%    BP Readings from Last 3 Encounters:  11/27/17 90/62  11/06/17 112/80  07/12/17 130/84   Wt Readings from Last 3 Encounters:  11/27/17 167 lb 12.8 oz (76.1 kg)  11/06/17 166 lb 3.2 oz (75.4 kg)  07/12/17 174 lb (78.9 kg)    Physical Exam  Constitutional: No distress.  Cardiovascular: Normal rate, regular rhythm and normal heart sounds.  Pulmonary/Chest: Effort normal and breath sounds normal.  Neurological: She is alert.  Skin: Skin is warm and dry. She is not diaphoretic.  Psychiatric:  Mood depressed, affect flat, intermittently tearful     Assessment/Plan: Please see individual problem list.  Anxiety and depression Stable.  She will continue Prozac.  Discussed that it may take up to 8 weeks for her to notice a difference on the Prozac.  She will discontinue the hydroxyzine.  Offered referral to a counselor though she deferred.  She will monitor her symptoms.  Given return precautions.    No orders of the defined types were placed in this encounter.   No orders of the defined types were placed in this  encounter.    Tommi Rumps, MD [place]

## 2017-11-27 NOTE — Patient Instructions (Signed)
Nice to see you. Please continue with the Prozac.  If your depression or anxiety worsens please let us know. If you develop thoughts of harming yourself or others please go to the emergency room.

## 2017-12-27 ENCOUNTER — Encounter: Payer: Self-pay | Admitting: Physician Assistant

## 2017-12-27 ENCOUNTER — Ambulatory Visit (INDEPENDENT_AMBULATORY_CARE_PROVIDER_SITE_OTHER): Payer: Self-pay | Admitting: Physician Assistant

## 2017-12-27 VITALS — BP 100/80 | HR 83 | Temp 98.5°F | Wt 165.0 lb

## 2017-12-27 DIAGNOSIS — J40 Bronchitis, not specified as acute or chronic: Secondary | ICD-10-CM

## 2017-12-27 MED ORDER — PREDNISONE 50 MG PO TABS: 50.0000 mg | ORAL_TABLET | Freq: Every day | ORAL | 0 refills | Status: AC

## 2017-12-27 NOTE — Progress Notes (Signed)
Patient ID: Sarah Gallagher DOB: 17-Apr-1980 AGE: 37 y.o. MRN: [id number]   PCP: Leone Haven, MD   Chief Complaint:  Chief Complaint  Patient presents with  . CHOICE-uri     Subjective:    HPI:  Sarah Gallagher is a 37 y.o. female presents for evaluation  Chief Complaint  Patient presents with  . CHOICE-uri   37 year old female presents with one week history of URI symptoms. Began with 1-2 days of malaise and fatigue. Then developed nasal congestion, bilateral maxillary sinus pressure/congestion, and PND. Then developed cough. Cough intermittently dry vs productive. When productive, thick green mucous. Has taken OTC Tylenol, ibuprofen, and Nyquil with minimal improvement. Associated chest tightness and shortness of breath. Patient with albuterol inhaler and flovent inhaler; has been using infrequently with no improvement. Denies fever, chills, headache, ear pain, sore throat, chest pain, wheezing. Patient with seasonal allergies. No diagnosis of asthma or COPD or emphysema. No smoking history.   A complete, at least 10 system review of symptoms was performed, pertinent positives and negatives as mentioned in HPI, otherwise negative.  The following portions of the patient's history were reviewed and updated as appropriate: allergies, current medications and past medical history.  Patient Active Problem List   Diagnosis Date Noted  . Anxiety and depression 11/27/2017  . Greater trochanteric bursitis of left hip 01/31/2017  . Low back pain 01/31/2017  . Nonallopathic lesion of lumbosacral region 01/31/2017  . Nonallopathic lesion of sacral region 01/31/2017  . Nonallopathic lesion of thoracic region 01/31/2017  . Dyspnea on exertion 12/18/2016  . Lower abdominal pain 12/18/2016  . Myalgia 12/18/2016  . Palpitations 12/18/2016  . Headache 12/18/2016  . History of occupational exposure to risk factor 12/18/2016    Allergies  Allergen Reactions  . Zithromax  [Azithromycin] Other (See Comments)    "convulsions or tremors"    Current Outpatient Medications on File Prior to Visit  Medication Sig Dispense Refill  . albuterol (PROVENTIL HFA;VENTOLIN HFA) 108 (90 Base) MCG/ACT inhaler Inhale 1-2 puffs into the lungs every 6 (six) hours as needed for wheezing or shortness of breath. 1 Inhaler 2  . aspirin-acetaminophen-caffeine (EXCEDRIN MIGRAINE) 250-250-65 MG tablet Take by mouth daily.    . baclofen (LIORESAL) 10 MG tablet Take 1 tablet (10 mg total) by mouth 3 (three) times daily. 30 each 0  . Cholecalciferol (VITAMIN D) 2000 units tablet Take 1 tablet (2,000 Units total) by mouth daily. 90 tablet 1  . FLUoxetine (PROZAC) 20 MG tablet Take 1 tablet (20 mg total) by mouth daily. 30 tablet 3  . medroxyPROGESTERone (DEPO-PROVERA) 150 MG/ML injection Inject 150 mg into the muscle every 3 (three) months.    . pantoprazole (PROTONIX) 40 MG tablet Take 40 mg by mouth daily.  3  . SUMAtriptan (IMITREX) 50 MG tablet 50 mg as needed.  2  . fluticasone (FLONASE) 50 MCG/ACT nasal spray Place into the nose.     Current Facility-Administered Medications on File Prior to Visit  Medication Dose Route Frequency Provider Last Rate Last Dose  . gadopentetate dimeglumine (MAGNEVIST) injection 15 mL  15 mL Intravenous Once PRN Penumalli, Vikram R, MD           Objective:    Vitals:   12/27/17 1124  BP: 100/80  Pulse: 83  Temp: 98.5 F (36.9 C)  SpO2: 98%     Wt Readings from Last 3 Encounters:  12/27/17 165 lb (74.8 kg)  11/27/17 167 lb 12.8 oz (76.1 kg)  11/06/17 166 lb 3.2 oz (75.4 kg)    Physical Exam:   General Appearance:  Alert, cooperative, no distress, appears stated age. Afebrile.  Head:  Normocephalic, without obvious abnormality, atraumatic  Eyes:  PERRL, conjunctiva/corneas clear, EOM's intact, fundi benign, both eyes  Ears:  Normal TM's and external ear canals, both ears  Nose: Nares normal, septum midline,mucosa normal, no drainage  or sinus tenderness  Throat: Lips, mucosa, and tongue normal; teeth and gums normal  Neck: Supple, symmetrical, trachea midline, no adenopathy;  thyroid: not enlarged, symmetric, no tenderness/mass/nodules; no carotid bruit or JVD  Back:   Symmetric, no curvature, ROM normal, no CVA tenderness  Lungs:   Clear to auscultation bilaterally, respirations unlabored. Cough elicited with deep inspiration. No wheezing with forced expiration.  Heart:  Regular rate and rhythm, S1 and S2 normal, no murmur, rub, or gallop  Abdomen:   Soft, non-tender, bowel sounds active all four quadrants,  no masses, no organomegaly  Extremities: Extremities normal, atraumatic, no cyanosis or edema  Pulses: 2+ and symmetric  Skin: Skin color, texture, turgor normal, no rashes or lesions  Lymph nodes: Cervical, supraclavicular, and axillary nodes normal  Neurologic: Normal    Assessment & Plan:    Exam findings, diagnosis etiology and medication use and indications reviewed with patient. Follow-Up and discharge instructions provided. No emergent/urgent issues found on exam.  Patient education was provided.   Patient verbalized understanding of information provided and agrees with plan of care (POC), all questions answered. The patient is advised to call or return to clinic if condition does not see an improvement in symptoms, or to seek the care of the closest emergency department if condition worsens with the above plan.   1. Bronchitis  - predniSONE (DELTASONE) 50 MG tablet; Take 1 tablet (50 mg total) by mouth daily with breakfast for 5 days.  Dispense: 5 tablet; Refill: 0  Patient one week history of URI with productive cough and chest tightness/shortness of breath. Prescribed 5-day blast of prednisone. Advised patient use albuterol inhaler. Advised increase fluids, rest, and OTC cough medication.  No antibiotic indicated at this time. Advised patient return to clinic in a few days if symptoms not  improving.   Montey Hora, MHS, PA-C Advanced Practice Provider [facility]  [address] [facility], [place], [place] [address] (p):  [phone] [email or web address]

## 2017-12-27 NOTE — Patient Instructions (Addendum)
Thank you for choosing [facility] for your health care needs today.  You have been diagnosed with a viral URI with secondary bronchitis.  1. Bronchitis  - predniSONE (DELTASONE) 50 MG tablet; Take 1 tablet (50 mg total) by mouth daily with breakfast for 5 days.  Dispense: 5 tablet; Refill: 0 Take in the morning; side effect of insomnia. Take with food to prevent stomach upset. May take over the counter Pepcid or Zantac if you develop stomach upset.  Recommend you increase fluids, rest, and take over the counter cough medication such as Delsym or Robitussin. Use albuterol inhaler: 2 puffs every 4 hours when awake. Will help with cough and shortness of breath.  Return to [place], urgent care, or PCP's office in a few days if not improving.   Acute Bronchitis, Adult  Acute bronchitis is when air tubes (bronchi) in the lungs suddenly get swollen. The condition can make it hard to breathe. It can also cause these symptoms:  A cough.  Coughing up clear, yellow, or green mucus.  Wheezing.  Chest congestion.  Shortness of breath.  A fever.  Body aches.  Chills.  A sore throat.  Follow these instructions at home: Medicines  Take over-the-counter and prescription medicines only as told by your doctor.  If you were prescribed an antibiotic medicine, take it as told by your doctor. Do not stop taking the antibiotic even if you start to feel better. General instructions  Rest.  Drink enough fluids to keep your pee (urine) clear or pale yellow.  Avoid smoking and secondhand smoke. If you smoke and you need help quitting, ask your doctor. Quitting will help your lungs heal faster.  Use an inhaler, cool mist vaporizer, or humidifier as told by your doctor.  Keep all follow-up visits as told by your doctor. This is important. How is this prevented? To lower your risk of getting this condition again:  Wash your hands often with soap and water. If you cannot use soap and  water, use hand sanitizer.  Avoid contact with people who have cold symptoms.  Try not to touch your hands to your mouth, nose, or eyes.  Make sure to get the flu shot every year.  Contact a doctor if:  Your symptoms do not get better in 2 weeks. Get help right away if:  You cough up blood.  You have chest pain.  You have very bad shortness of breath.  You become dehydrated.  You faint (pass out) or keep feeling like you are going to pass out.  You keep throwing up (vomiting).  You have a very bad headache.  Your fever or chills gets worse. This information is not intended to replace advice given to you by your health care provider. Make sure you discuss any questions you have with your health care provider. Document Released: 09/06/2007 Document Revised: 10/27/2015 Document Reviewed: 09/08/2015 Elsevier Interactive Patient Education  [employer].

## 2017-12-31 ENCOUNTER — Telehealth: Payer: Self-pay | Admitting: Emergency Medicine

## 2017-12-31 ENCOUNTER — Other Ambulatory Visit: Payer: Self-pay | Admitting: Physician Assistant

## 2017-12-31 MED ORDER — AMOXICILLIN-POT CLAVULANATE 875-125 MG PO TABS: 1.0000 | ORAL_TABLET | Freq: Two times a day (BID) | ORAL | 0 refills | Status: AC

## 2017-12-31 MED ORDER — FLUCONAZOLE 150 MG PO TABS: 150.0000 mg | ORAL_TABLET | Freq: Every day | ORAL | 0 refills | Status: AC

## 2017-12-31 NOTE — Telephone Encounter (Signed)
Called patient. Patient reports continued rhinorrhea and PND; green in color. Coarse/productive cough, only in the morning secondary to PND. Discussed treatment options. At this time, antibiotic is warranted. Will prescribed 7-day course of Augmentin. Prescribed Diflucan, in case of vaginal yeast infection. Medications sent to pharmacy. SFS PA-C.

## 2017-12-31 NOTE — Progress Notes (Signed)
Reference telephone encounter with provider 12/21/2017. Patient prescribed augmentin for sinusitis and diflucan (for possible secondary vaginal yeast infection). SFS PA-C

## 2018-01-15 ENCOUNTER — Telehealth: Payer: Self-pay | Admitting: Family Medicine

## 2018-01-15 DIAGNOSIS — M7062 Trochanteric bursitis, left hip: Secondary | ICD-10-CM

## 2018-01-15 NOTE — Telephone Encounter (Signed)
Copied from [place] [id number]. Topic: General - Other >> Jan 15, 2018  3:39 PM Keene Breath wrote: Reason for CRM: Patient called to request a referral for Physical Therapy at [facility] in [facility] for patient's hip.  Please advise.  CB# [phone]

## 2018-01-16 NOTE — Telephone Encounter (Signed)
Ok but needs to see me in 4 weeks to see how it is going

## 2018-01-16 NOTE — Telephone Encounter (Signed)
Spoke with pt, PT referral entered & scheduled pt for appt with Dr. Tamala Julian on 11.13.19.

## 2018-01-16 NOTE — Telephone Encounter (Signed)
lmovm for pt to return call.  

## 2018-01-16 NOTE — Telephone Encounter (Signed)
You have not seen this patient since 07/2017. Are you okay with referring her to PT?

## 2018-01-22 ENCOUNTER — Ambulatory Visit: Payer: Self-pay | Admitting: Family Medicine

## 2018-01-22 ENCOUNTER — Telehealth: Payer: Self-pay | Admitting: Obstetrics & Gynecology

## 2018-01-22 NOTE — Telephone Encounter (Signed)
We have received patient records from [facility] phyisican's. Called and spoke with patient to schedule appointment. Patient requested to call back due to currently at work. Patient will call back to be schedule

## 2018-01-23 NOTE — Telephone Encounter (Signed)
Patient schedule 01/29/18 with Dr. Gilman Schmidt

## 2018-01-29 ENCOUNTER — Other Ambulatory Visit (HOSPITAL_COMMUNITY)
Admission: RE | Admit: 2018-01-29 | Discharge: 2018-01-29 | Disposition: A | Discharge status: Home / Self Care | Payer: 59 | Source: Ambulatory Visit | Attending: Obstetrics and Gynecology | Admitting: Obstetrics and Gynecology

## 2018-01-29 ENCOUNTER — Ambulatory Visit (INDEPENDENT_AMBULATORY_CARE_PROVIDER_SITE_OTHER): Payer: 59 | Admitting: Obstetrics and Gynecology

## 2018-01-29 ENCOUNTER — Encounter: Payer: Self-pay | Admitting: Obstetrics and Gynecology

## 2018-01-29 VITALS — BP 104/60 | HR 79 | Ht 66.0 in | Wt 168.5 lb

## 2018-01-29 DIAGNOSIS — R87612 Low grade squamous intraepithelial lesion on cytologic smear of cervix (LGSIL): Secondary | ICD-10-CM

## 2018-01-29 DIAGNOSIS — N870 Mild cervical dysplasia: Secondary | ICD-10-CM | POA: Insufficient documentation

## 2018-01-29 DIAGNOSIS — N72 Inflammatory disease of cervix uteri: Secondary | ICD-10-CM | POA: Diagnosis not present

## 2018-01-29 NOTE — Progress Notes (Signed)
   GYNECOLOGY CLINIC COLPOSCOPY PROCEDURE NOTE  37 y.o. [id number] here for colposcopy for low-grade squamous intraepithelial neoplasia (LGSIL - encompassing HPV,mild dysplasia,CIN I)  pap smear on 10/2016. Discussed underlying role for HPV infection in the development of cervical dysplasia, its natural history and progression/regression, need for surveillance.  Is the patient  pregnant: No LMP: No LMP recorded. Patient has had an injection. Smoking status:  reports that she has never smoked. She has never used smokeless tobacco. Contraception: Depo-Provera injections Future fertility desired:  No  Patient given informed consent, signed copy in the chart, time out was performed.  The patient was position in dorsal lithotomy position. Speculum was placed the cervix was visualized.   After application of acetic acid colposcopic inspection of the cervix was undertaken.   Colposcopy adequate, full visualization of transformation zone: Yes abnormal vessels noted at 5 and 10 o'clock, aceto white lesions from 3-11 o'clock; corresponding biopsies obtained at 5, 6, 10 o'clock ECC specimen obtained:  Yes  All specimens were labeled and sent to pathology.   Patient was given post procedure instructions.  Will follow up pathology and manage accordingly.  Routine preventative health maintenance measures emphasized.  Physical Exam  Genitourinary:       Adrian Prows MD [facility], [facility] 01/29/18 3:48 PM

## 2018-01-29 NOTE — Patient Instructions (Signed)
Colposcopy, Care After This sheet gives you information about how to care for yourself after your procedure. Your doctor may also give you more specific instructions. If you have problems or questions, contact your doctor. What can I expect after the procedure? If you did not have a tissue sample removed (did not have a biopsy), you may only have some spotting for a few days. You can go back to your normal activities. If you had a tissue sample removed, it is common to have:  Soreness and pain. This may last for a few days.  Light-headedness.  Mild bleeding from your vagina or dark-colored, grainy discharge from your vagina. This may last for a few days. You may need to wear a sanitary pad.  Spotting for at least 48 hours after the procedure.  Follow these instructions at home:  Take over-the-counter and prescription medicines only as told by your doctor. Ask your doctor what medicines you can start taking again. This is very important if you take blood-thinning medicine.  Do not drive or use heavy machinery while taking prescription pain medicine.  For 3 days, or as long as your doctor tells you, avoid: ? Douching. ? Using tampons. ? Having sex.  If you use birth control (contraception), keep using it.  Limit activity for the first day after the procedure. Ask your doctor what activities are safe for you.  It is up to you to get the results of your procedure. Ask your doctor when your results will be ready.  Keep all follow-up visits as told by your doctor. This is important. Contact a doctor if:  You get a skin rash. Get help right away if:  You are bleeding a lot from your vagina. It is a lot of bleeding if you are using more than one pad an hour for 2 hours in a row.  You have clumps of blood (blood clots) coming from your vagina.  You have a fever.  You have chills  You have pain in your lower belly (pelvic area).  You have signs of infection, such as vaginal  discharge that is: ? Different than usual. ? Yellow. ? Bad-smelling.  You have very pain or cramps in your lower belly that do not get better with medicine.  You feel light-headed.  You feel dizzy.  You pass out (faint). Summary  If you did not have a tissue sample removed (did not have a biopsy), you may only have some spotting for a few days. You can go back to your normal activities.  If you had a tissue sample removed, it is common to have mild pain and spotting for 48 hours.  For 3 days, or as long as your doctor tells you, avoid douching, using tampons and having sex.  Get help right away if you have bleeding, very bad pain, or signs of infection. This information is not intended to replace advice given to you by your health care provider. Make sure you discuss any questions you have with your health care provider. Document Released: 09/06/2007 Document Revised: 12/08/2015 Document Reviewed: 12/08/2015 Elsevier Interactive Patient Education  2018 [place] POST-PROCEDURE INSTRUCTIONS  1. You may take Ibuprofen, Aleve or Tylenol for cramping if needed.  2. If Monsel's solution was used, you will have a black discharge.  3. Light bleeding is normal.  If bleeding is heavier than your period, please call.  4. Put nothing in your vagina until the bleeding or discharge stops (usually 2 or3 days).  5. We will call you within one week with biopsy results or discuss the results at your follow-up appointment if needed.

## 2018-02-06 ENCOUNTER — Encounter: Payer: Self-pay | Admitting: Physical Therapy

## 2018-02-06 ENCOUNTER — Ambulatory Visit: Payer: 59 | Attending: Family Medicine | Admitting: Physical Therapy

## 2018-02-06 DIAGNOSIS — M25552 Pain in left hip: Secondary | ICD-10-CM | POA: Diagnosis not present

## 2018-02-06 NOTE — Therapy (Signed)
[facility] [address] [place], [address] Phone: [phone]   Fax:  [phone]  Physical Therapy Evaluation  Patient Details  Name: Sarah Gallagher MRN: [id number] Date of Birth: 1980-06-10 No data recorded  Encounter Date: 02/06/2018    Past Medical History:  Diagnosis Date  . Arthritis   . Chest discomfort    a. associated with palpitations.  . Depression   . Dyspnea on exertion    a. 12/2016 Echo: EF 55-60%, no rwma, nl PASP.  Marland Kitchen Juvenile rheumatoid arthritis ([place])   . Meniere disease   . Migraines   . Palpitations   . PVC's (premature ventricular contractions)    a. 01/2017 48h Holter: Rare PAC/PVC. Freq runs of sinus tachycardia.    Past Surgical History:  Procedure Laterality Date  . KNEE SURGERY  1997, 1997   torn meniscus, ACL replacement  . REFRACTIVE SURGERY     lasix    There were no vitals filed for this visit.   Subjective Assessment - 02/06/18 1059    Pertinent History  Pt is a 37 year old female that has been seen by PCP for left hip bursitis from Oct 2018 until now. Patient reports she has had injections L hip for bursitis and unknow injections in (patient points to SIJ area). Patient reports cheif pain complaint of L hip pain, with secondary complaints of low back pain, R foot pain and cervical pain. Patient reports she saw Nicole Kindred PT for this condition with DN to the piriformis which she reports "helped for a little while" and DN to paraspinals near L4 which she reports increased tension here. Patient reports the majority of her L hip pain is posterior at the buttock and at the L lumbar spine. Does report occassional "catch" in groin with walking.            ROM Lumbar flex: wnl Lumbar ext: wnl with noted hypermobility Lumbar rotation wnl bilat with some pain in upper back  Lumbar lateral bending: wnl bilat with opposite oblique pain  All hip motions wnl with some pain  limitations and soft tissue restriction FADIR  Strength Hip flex: L 4-/5 with slight pain  R 5/5 Hip abd L:4-/5 R 4+/5 Hip Ext in prone: L 4+/5 R 5/5 Hip IR: 5/5 bilat Hip ER: L 4-/5  R 5/5     Special Tests/ Other Reflexes 2+ bilat patellar (-) slump bilat (-) SLR (-) Crossed SLR (-) Elys bilat (+) Thomas Test on L ONLY (-) 90/90 bilat (+) FABER bilat (-) FADIR bilat (+) Obers L (-) Scour bilat (-) SI cluster (+) Prone instability test L3-S2 CPA = pain relief  Beighton Scale 0/9 Fibromyalgia Tender points only (+) for glute points 10MWT 1.29m/s Gait: decreased trunk and bilat hip rotation  Palpation: tender to palpation directly over L GT with withdraw response. TTP with noted tension at L deep hip ER and L paraspinals  Ther-Ex -Seated piriformis stretch 30sec holds (HEP) - Child's pose stretch 30sec holds (HEP) - Education on pain cycle and increased sensitivity associated with this and desensitization techniques (ball rolling) -Education on PT role and there-ex role on pain and strengthening muscles surrounding the spine to increase stability and decrease pain                     Objective measurements completed on examination: See above findings.  Patient will benefit from skilled therapeutic intervention in order to improve the following deficits and impairments:     Visit Diagnosis: No diagnosis found.     Problem List Patient Active Problem List   Diagnosis Date Noted  . Anxiety and depression 11/27/2017  . Greater trochanteric bursitis of left hip 01/31/2017  . Low back pain 01/31/2017  . Nonallopathic lesion of lumbosacral region 01/31/2017  . Nonallopathic lesion of sacral region 01/31/2017  . Nonallopathic lesion of thoracic region 01/31/2017  . Dyspnea on exertion 12/18/2016  . Lower abdominal pain 12/18/2016  . Myalgia 12/18/2016  . Palpitations 12/18/2016  . Headache  12/18/2016  . History of occupational exposure to risk factor 12/18/2016   . Shelton Silvas 02/06/2018, 11:07 AM  [facility] [address], [place], [address] Phone: [phone]   Fax:  [phone]  Name: Sarah Gallagher MRN: [id number] Date of Birth: 02-03-1981

## 2018-02-11 ENCOUNTER — Other Ambulatory Visit: Payer: Self-pay | Admitting: Obstetrics and Gynecology

## 2018-02-11 DIAGNOSIS — Z3009 Encounter for other general counseling and advice on contraception: Secondary | ICD-10-CM

## 2018-02-11 MED ORDER — MEDROXYPROGESTERONE ACETATE 150 MG/ML IM SUSP: 150.0000 mg | INTRAMUSCULAR | 3 refills | Status: DC

## 2018-02-11 NOTE — Progress Notes (Signed)
CIN 1- discussed with patient on the phone, repeat pap smear in 1 year.

## 2018-02-11 NOTE — Progress Notes (Signed)
Prescription for Depo provera sent for birth control

## 2018-02-12 ENCOUNTER — Ambulatory Visit: Payer: 59 | Admitting: Physical Therapy

## 2018-02-12 ENCOUNTER — Encounter: Payer: Self-pay | Admitting: Physical Therapy

## 2018-02-12 DIAGNOSIS — M25552 Pain in left hip: Secondary | ICD-10-CM | POA: Diagnosis not present

## 2018-02-12 NOTE — Therapy (Signed)
[facility] [address], [place], [address] Phone: [phone]   Fax:  [phone]  Physical Therapy Treatment  Patient Details  Name: Sarah Gallagher MRN: [id number] Date of Birth: 1980-12-26 Referring Provider (PT): [place]   Encounter Date: 02/12/2018  PT End of Session - 02/12/18 1628    Visit Number  2    Number of Visits  17    Date for PT Re-Evaluation  04/03/18    PT Start Time  0420    PT Stop Time  0500    PT Time Calculation (min)  40 min    Activity Tolerance  Patient tolerated treatment well    Behavior During Therapy  [facility] for tasks assessed/performed       Past Medical History:  Diagnosis Date  . Arthritis   . Chest discomfort    a. associated with palpitations.  . Depression   . Dyspnea on exertion    a. 12/2016 Echo: EF 55-60%, no rwma, nl PASP.  Marland Kitchen Juvenile rheumatoid arthritis ([place])   . Meniere disease   . Migraines   . Palpitations   . PVC's (premature ventricular contractions)    a. 01/2017 48h Holter: Rare PAC/PVC. Freq runs of sinus tachycardia.    Past Surgical History:  Procedure Laterality Date  . KNEE SURGERY  1997, 1997   torn meniscus, ACL replacement  . REFRACTIVE SURGERY     lasix    There were no vitals filed for this visit.  Subjective Assessment - 02/12/18 1623    Subjective  Patient reports L hip pain 5/10 today. Patient reports stretching a "couple hours ago" which she thinks is helping her pain "some". Patient reports compliance with HEP with no questions or concerns.     Pertinent History  Pt is a 37 year old female that has been seen by PCP for left hip bursitis from Oct 2018 until now. Patient reports she has had injections L hip for bursitis and unknow injections in (patient points to SIJ area). Patient reports cheif pain complaint of L hip pain, with secondary complaints of low back pain, R foot pain and cervical pain. Patient reports she saw  Nicole Kindred PT for this condition with DN to the piriformis which she reports "helped for a little while" and DN to paraspinals near L4 which she reports increased tension here. Patient reports the majority of her L hip pain is posterior at the buttock and at the L lumbar spine. Does report occassional "catch" in groin with walking. Patient reports her L buttock/LBP is tender to touch and constantly feels "tight". 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 Patient reports worst pain in the past week 7/10; best 3/10. Pt denies N/V, unexplained weight fluctuation, B&B changes, saddle paresthesia, fever, night sweats, or unrelenting night pain at this time.    Limitations  Sitting;Lifting;Standing;Walking    How long can you sit comfortably?  76min    How long can you stand comfortably?  40min    How long can you walk comfortably?  4min    Diagnostic tests  XRay 1 year ago unremarkable    Patient Stated Goals  Decrease pain, return to exercise regimen (running)    Pain Onset  More than a month ago      Ther-Ex - MATRIX hip ext 70# LLE only 3x 10  - MATRIX hip abd 55# bilat  3x 10 with min TC for proper posture - OMEGA leg press 35# x10 55# x10 65# x10 wit min cuing for eccentric control with good carry over following  Manual - STM with trigger point release to L glute max/min/med and hamstring with manual as well as roller with patient in prone and sidelying - Manual obers stretch 3x 30sec with inc ROM each bout                        PT Education - 02/12/18 1626    Education Details  exercise form    Person(s) Educated  Patient    Methods  Explanation;Verbal cues;Demonstration    Comprehension  Verbalized understanding;Verbal cues required;Returned demonstration       PT Short Term Goals - 02/06/18 1250      PT SHORT TERM GOAL #1   Title  Pt will be independent  with HEP in order to improve strength and balance in order to decrease fall risk and improve function at home and work.    Time  4    Period  Weeks    Status  New        PT Long Term Goals - 02/06/18 1257      PT LONG TERM GOAL #1   Title  Patient will increase FOTO score to 58 to demonstrate predicted increase in functional mobility to complete ADLs    Baseline  02/06/18 44    Time  8    Period  Weeks    Status  New      PT LONG TERM GOAL #2   Title  Pt will increase 10MWT by at least 0.13 m/s in order to demonstrate clinically significant improvement in community ambulation.     Baseline  02/06/18 1.44m/s    Time  8    Period  Weeks    Status  New      PT LONG TERM GOAL #3   Title  Pt will decrease worst pain as reported on NPRS by at least 3 points in order to demonstrate clinically significant reduction in pain.    Baseline  02/06/18 7/10    Time  8    Period  Weeks    Status  New            Plan - 02/13/18 1422    Clinical Impression Statement  Patient reports no pain following manual techniques. Patient is able to complete all therex with accuracy following PT cuing. PT encouraged HEP compliance to maintain gains made during session.     Rehab Potential  Good    Clinical Impairments Affecting Rehab Potential  (+) young age, motivation, attempting active lifestyle (-) lack of social support currently, psycho social factors, chronicity of pain, multiple pain sites    PT Frequency  2x / week    PT Duration  8 weeks    PT Treatment/Interventions  ADLs/Self Care Home Management;Aquatic Therapy;Moist Heat;Ultrasound;Traction;Cryotherapy;Electrical Stimulation;Therapeutic exercise;Therapeutic activities;Patient/family education;Functional mobility training;Neuromuscular re-education;Passive range of motion;Dry needling;Manual techniques;Taping;Spinal Manipulations    PT Next Visit Plan  HEP and goal review, ST techniques     PT Home Exercise Plan  childs pose, seated  piriformis stretch, heat, ball rolling    Consulted and Agree with Plan of Care  Patient       Patient will benefit from skilled therapeutic intervention in order to improve the following deficits and impairments:  Pain, Improper body mechanics, Increased fascial restricitons, Abnormal gait, Decreased mobility, Decreased  activity tolerance, Decreased endurance, Postural dysfunction, Decreased strength, Difficulty walking, Impaired flexibility  Visit Diagnosis: Pain in left hip     Problem List Patient Active Problem List   Diagnosis Date Noted  . Anxiety and depression 11/27/2017  . Greater trochanteric bursitis of left hip 01/31/2017  . Low back pain 01/31/2017  . Nonallopathic lesion of lumbosacral region 01/31/2017  . Nonallopathic lesion of sacral region 01/31/2017  . Nonallopathic lesion of thoracic region 01/31/2017  . Dyspnea on exertion 12/18/2016  . Lower abdominal pain 12/18/2016  . Myalgia 12/18/2016  . Palpitations 12/18/2016  . Headache 12/18/2016  . History of occupational exposure to risk factor 12/18/2016   Shelton Silvas PT, DPT Shelton Silvas 02/13/2018, 2:23 PM  [facility] [address] [place], [address] Phone: [phone]   Fax:  [phone]  Name: Sarah Gallagher MRN: [id number] Date of Birth: 19-Sep-1980

## 2018-02-13 ENCOUNTER — Ambulatory Visit: Payer: Self-pay | Admitting: Family Medicine

## 2018-02-20 ENCOUNTER — Encounter: Payer: Self-pay | Admitting: Physical Therapy

## 2018-02-20 ENCOUNTER — Ambulatory Visit: Payer: 59 | Admitting: Physical Therapy

## 2018-02-20 DIAGNOSIS — M25552 Pain in left hip: Secondary | ICD-10-CM

## 2018-02-20 NOTE — Therapy (Signed)
[facility] [address] [place], [address] Phone: [phone]   Fax:  [phone]  Physical Therapy Treatment  Patient Details  Name: Sarah Gallagher MRN: [id number] Date of Birth: 1980-10-27 Referring Provider (PT): [place]   Encounter Date: 02/20/2018  PT End of Session - 02/20/18 1638    Visit Number  3    Number of Visits  17    Date for PT Re-Evaluation  04/03/18    PT Start Time  0400    PT Stop Time  0445    PT Time Calculation (min)  45 min    Activity Tolerance  Patient tolerated treatment well    Behavior During Therapy  [facility] for tasks assessed/performed       Past Medical History:  Diagnosis Date  . Arthritis   . Chest discomfort    a. associated with palpitations.  . Depression   . Dyspnea on exertion    a. 12/2016 Echo: EF 55-60%, no rwma, nl PASP.  Marland Kitchen Juvenile rheumatoid arthritis ([place])   . Meniere disease   . Migraines   . Palpitations   . PVC's (premature ventricular contractions)    a. 01/2017 48h Holter: Rare PAC/PVC. Freq runs of sinus tachycardia.    Past Surgical History:  Procedure Laterality Date  . KNEE SURGERY  1997, 1997   torn meniscus, ACL replacement  . REFRACTIVE SURGERY     lasix    There were no vitals filed for this visit.  Subjective Assessment - 02/21/18 1213    Subjective  Patient reports she is still having the same hip pain, although she did have relief following last session    Pertinent History  Pt is a 37 year old female that has been seen by PCP for left hip bursitis from Oct 2018 until now. Patient reports she has had injections L hip for bursitis and unknow injections in (patient points to SIJ area). Patient reports cheif pain complaint of L hip pain, with secondary complaints of low back pain, R foot pain and cervical pain. Patient reports she saw Nicole Kindred PT for this condition with DN to the piriformis which she reports "helped for a little  while" and DN to paraspinals near L4 which she reports increased tension here. Patient reports the majority of her L hip pain is posterior at the buttock and at the L lumbar spine. Does report occassional "catch" in groin with walking. Patient reports her L buttock/LBP is tender to touch and constantly feels "tight". 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 Patient reports worst pain in the past week 7/10; best 3/10. Pt denies N/V, unexplained weight fluctuation, B&B changes, saddle paresthesia, fever, night sweats, or unrelenting night pain at this time.    Limitations  Sitting;Lifting;Standing;Walking    How long can you sit comfortably?  2min    How long can you stand comfortably?  71min    How long can you walk comfortably?  64min    Diagnostic tests  XRay 1 year ago unremarkable    Patient Stated Goals  Decrease pain, return to exercise regimen (running)    Pain Onset  More than a month ago       Ther-Ex - Bridge with ball squeeze 3x 10 with min cuing initially for proper form and glute activation with good carry over  - Ober stretch x14min inc ROM as able  Manual - STM with trigger point release to L glute max/min/med and hamstring with manual as well as roller with patient in prone and sidelying - Manual obers stretch 3x 30sec with inc ROM each bout     ESTIM + heat pack HiVolt ESTIM 12 min at patient tolerated 140>150V to L glute med/max . Attempted to decrease tension in this area. With PT assessing patient tolerance throughout (increasing intensity as needed), monitoring skin integrity (normal), with decreased pain noted from patient. Educated patient during this time on this area creating tension on the tendon, irritating the bursa, to ensure compliance with HEP stretching for carry over  Ther-Ex At the end of session demonstrated glute med standing stretch with patient  demonstrating with accuracy and reporting she "feels the stretch in the correct area"                        PT Education - 02/20/18 1638    Education Details  Exercise form    Person(s) Educated  Patient    Methods  Explanation;Demonstration;Verbal cues    Comprehension  Verbalized understanding;Returned demonstration;Verbal cues required       PT Short Term Goals - 02/06/18 1250      PT SHORT TERM GOAL #1   Title  Pt will be independent with HEP in order to improve strength and balance in order to decrease fall risk and improve function at home and work.    Time  4    Period  Weeks    Status  New        PT Long Term Goals - 02/06/18 1257      PT LONG TERM GOAL #1   Title  Patient will increase FOTO score to 58 to demonstrate predicted increase in functional mobility to complete ADLs    Baseline  02/06/18 44    Time  8    Period  Weeks    Status  New      PT LONG TERM GOAL #2   Title  Pt will increase 10MWT by at least 0.13 m/s in order to demonstrate clinically significant improvement in community ambulation.     Baseline  02/06/18 1.13m/s    Time  8    Period  Weeks    Status  New      PT LONG TERM GOAL #3   Title  Pt will decrease worst pain as reported on NPRS by at least 3 points in order to demonstrate clinically significant reduction in pain.    Baseline  02/06/18 7/10    Time  8    Period  Weeks    Status  New            Plan - 02/21/18 1215    Clinical Impression Statement  PT focused on pain management this session. Patient continues to report no pain at the end of session following manual + modality techniques. Encouraged HEP to continue d/c pain. Patient with good form and proper glute activation with therex, and no increased pain.     Rehab Potential  Good    Clinical Impairments Affecting Rehab Potential  (+) young age, motivation, attempting active lifestyle (-) lack of social support currently, psycho social factors, chronicity  of pain, multiple pain sites    PT Frequency  2x / week    PT Duration  8 weeks    PT Treatment/Interventions  ADLs/Self Care Home Management;Aquatic Therapy;Moist Heat;Ultrasound;Traction;Cryotherapy;Electrical Stimulation;Therapeutic exercise;Therapeutic activities;Patient/family education;Functional mobility training;Neuromuscular re-education;Passive  range of motion;Dry needling;Manual techniques;Taping;Spinal Manipulations    PT Next Visit Plan  HEP and goal review, ST techniques     PT Home Exercise Plan  childs pose, seated piriformis stretch, heat, ball rolling    Consulted and Agree with Plan of Care  Patient       Patient will benefit from skilled therapeutic intervention in order to improve the following deficits and impairments:  Pain, Improper body mechanics, Increased fascial restricitons, Abnormal gait, Decreased mobility, Decreased activity tolerance, Decreased endurance, Postural dysfunction, Decreased strength, Difficulty walking, Impaired flexibility  Visit Diagnosis: Pain in left hip     Problem List Patient Active Problem List   Diagnosis Date Noted  . Anxiety and depression 11/27/2017  . Greater trochanteric bursitis of left hip 01/31/2017  . Low back pain 01/31/2017  . Nonallopathic lesion of lumbosacral region 01/31/2017  . Nonallopathic lesion of sacral region 01/31/2017  . Nonallopathic lesion of thoracic region 01/31/2017  . Dyspnea on exertion 12/18/2016  . Lower abdominal pain 12/18/2016  . Myalgia 12/18/2016  . Palpitations 12/18/2016  . Headache 12/18/2016  . History of occupational exposure to risk factor 12/18/2016   Shelton Silvas PT, DPT Shelton Silvas 02/21/2018, 12:22 PM  [facility] [address] [place], [address] Phone: [phone]   Fax:  [phone]  Name: Sarah Gallagher MRN: [id number] Date of Birth: Jun 04, 1980

## 2018-02-21 ENCOUNTER — Encounter: Payer: Self-pay | Admitting: Physical Therapy

## 2018-02-26 ENCOUNTER — Encounter: Payer: Self-pay | Admitting: Physical Therapy

## 2018-02-26 ENCOUNTER — Ambulatory Visit: Payer: 59 | Admitting: Physical Therapy

## 2018-02-26 ENCOUNTER — Ambulatory Visit (INDEPENDENT_AMBULATORY_CARE_PROVIDER_SITE_OTHER): Payer: Self-pay | Admitting: Physician Assistant

## 2018-02-26 ENCOUNTER — Encounter: Payer: Self-pay | Admitting: Physician Assistant

## 2018-02-26 VITALS — BP 112/80 | HR 80 | Temp 97.8°F | Wt 173.0 lb

## 2018-02-26 DIAGNOSIS — N3000 Acute cystitis without hematuria: Secondary | ICD-10-CM

## 2018-02-26 DIAGNOSIS — M25552 Pain in left hip: Secondary | ICD-10-CM

## 2018-02-26 DIAGNOSIS — R300 Dysuria: Secondary | ICD-10-CM

## 2018-02-26 LAB — POCT URINALYSIS DIPSTICK
Bilirubin, UA: 3
Glucose, UA: POSITIVE — AB
KETONES UA: NEGATIVE
Leukocytes, UA: NEGATIVE
NITRITE UA: POSITIVE
PH UA: 6.5 (ref 5.0–8.0)
PROTEIN UA: POSITIVE — AB
RBC UA: NEGATIVE
UROBILINOGEN UA: 2 U/dL — AB

## 2018-02-26 LAB — POCT URINE PREGNANCY: Preg Test, Ur: NEGATIVE

## 2018-02-26 MED ORDER — NITROFURANTOIN MONOHYD MACRO 100 MG PO CAPS: 100.0000 mg | ORAL_CAPSULE | Freq: Two times a day (BID) | ORAL | 0 refills | Status: AC

## 2018-02-26 NOTE — Progress Notes (Signed)
Patient ID: Sarah Gallagher DOB: 1980-08-05 AGE: 37 y.o. MRN: [id number]   PCP: Leone Haven, MD   Chief Complaint:  Chief Complaint  Patient presents with  . Choice-dysuria/feq and burning x2wk    used AZO     Subjective:    HPI:  Sarah Gallagher is a 37 y.o. female presents for evaluation  Chief Complaint  Patient presents with  . Choice-dysuria/feq and burning x2wk    used AZO   37 year old female presents to [facility] with 2-3 week history of UTI symptoms. Reports dysuria. Primarily at end of urinary stream. Has also had increased urinary frequency, including nocturia. Typically does have significant amount to void; however, symptoms worsened yesterday evening. Has been having constant sensation of needing to urinate, however only scant amount is voided. Has developed left sided low back pain; mild and intermittent. Has developed suprapubic discomfort; describes as pressure and episode of spasm after urination. Has taken OTC Azo with moderate symptom relief. Patient feels symptoms improved four days ago, then returned more severe yesterday evening.   Denies fever, chills, flank pain, abdominal pain, nausea/vomiting, vaginal rash/discharge/pruritis. Denies concern for STD.  Patient last seen by Ob/Gyn, [facility], for general exam on 01/17/2017. Birth control via [place]. Benign pelvic examination. Pap smear revealed low grad squamous intraepithelial lesion. Patient underwent colposcopy 02/12/2017. Pap repeated on 10/23/2017 (result not available for my review).  Patient with no previous history of DM. Denies increased hunger or thirst.  A complete, at least 10 system review of symptoms was performed, pertinent positives and negatives as mentioned in HPI, otherwise negative.  The following portions of the patient's history were reviewed and updated as appropriate: allergies, current medications and past medical history.  Patient Active  Problem List   Diagnosis Date Noted  . Anxiety and depression 11/27/2017  . Greater trochanteric bursitis of left hip 01/31/2017  . Low back pain 01/31/2017  . Nonallopathic lesion of lumbosacral region 01/31/2017  . Nonallopathic lesion of sacral region 01/31/2017  . Nonallopathic lesion of thoracic region 01/31/2017  . Dyspnea on exertion 12/18/2016  . Lower abdominal pain 12/18/2016  . Myalgia 12/18/2016  . Palpitations 12/18/2016  . Headache 12/18/2016  . History of occupational exposure to risk factor 12/18/2016    Allergies  Allergen Reactions  . Zithromax [Azithromycin] Other (See Comments)    "convulsions or tremors"    Current Outpatient Medications on File Prior to Visit  Medication Sig Dispense Refill  . albuterol (PROVENTIL HFA;VENTOLIN HFA) 108 (90 Base) MCG/ACT inhaler Inhale 1-2 puffs into the lungs every 6 (six) hours as needed for wheezing or shortness of breath. 1 Inhaler 2  . aspirin-acetaminophen-caffeine (EXCEDRIN MIGRAINE) 250-250-65 MG tablet Take by mouth daily.    . baclofen (LIORESAL) 10 MG tablet Take 1 tablet (10 mg total) by mouth 3 (three) times daily. 30 each 0  . fluticasone (FLONASE) 50 MCG/ACT nasal spray Place into the nose.    . medroxyPROGESTERone (DEPO-PROVERA) 150 MG/ML injection Inject 1 mL (150 mg total) into the muscle every 3 (three) months. 1 mL 3  . pantoprazole (PROTONIX) 40 MG tablet Take 40 mg by mouth daily.  3  . SUMAtriptan (IMITREX) 50 MG tablet 50 mg as needed.  2   No current facility-administered medications on file prior to visit.        Objective:   Vitals:   02/26/18 1025  BP: 112/80  Pulse: 80  Temp: 97.8 F (36.6 C)  SpO2:  96%     Wt Readings from Last 3 Encounters:  02/26/18 173 lb (78.5 kg)  01/29/18 168 lb 8 oz (76.4 kg)  12/27/17 165 lb (74.8 kg)    Physical Exam:   General Appearance:  Alert, cooperative, appears stated age. In no acute distress. Afebrile.  Head:  Normocephalic, without obvious  abnormality, atraumatic  Eyes:  PERRL, conjunctiva/corneas clear, EOM's intact  Ears:  Normal external ear appearance.  Nose: Nares normal  Back:   Symmetric, no curvature, ROM normal, no CVA tenderness with percussion. Patient localizes left low back, over left SI joint of previous location of discomfort. No tenderness with palpation currently. Patient denies current low back pain.  Lungs:   Clear to auscultation bilaterally, respirations unlabored  Heart:  Regular rate and rhythm, S1 and S2 normal, no murmur, rub, or gallop  Abdomen:   Soft, bowel sounds active all four quadrants, no masses, no organomegaly. Mild tenderness with palpation in suprapubic area. No guarding, rigidity, or rebound tenderness.  Extremities: Extremities normal, atraumatic, no cyanosis or edema  Pulses: 2+ and symmetric  Skin: Skin color, texture, turgor normal, no rashes or lesions  Lymph nodes: Cervical, supraclavicular, and axillary nodes normal  Neurologic: Normal    Assessment & Plan:    Exam findings, diagnosis etiology and medication use and indications reviewed with patient. Follow-Up and discharge instructions provided. No emergent/urgent issues found on exam.  Patient education was provided.   Patient verbalized understanding of information provided and agrees with plan of care (POC), all questions answered. The patient is advised to call or return to clinic if condition does not see an improvement in symptoms, or to seek the care of the closest emergency department if condition worsens with the below plan.   Orders Placed This Encounter  Procedures  . POCT Urinalysis Dipstick    1. Acute cystitis without hematuria  - nitrofurantoin, macrocrystal-monohydrate, (MACROBID) 100 MG capsule; Take 1 capsule (100 mg total) by mouth 2 (two) times daily for 5 days.  Dispense: 10 capsule; Refill: 0  2. Dysuria  Urinalysis: Blood Neg Bil 3.0 Uro 12.0 Ket Neg Pro 300 Nit Pos Glu 100 p.H. 6.5 S.G  <1.005 Leu 10 Col Dk Orange Cla Clear  Urine pregnancy test: Neg  Patient with 2-3 week history of dysuria and increased urinary frequency. Improved four days ago, worsened yesterday evening. UA reveals small leukocytes (glucose, nitrates, and dark orange color may be due to Azo). Will treat patient for acute uncomplicated UTI. Prescribed 5-day course of Macrobid. Discussed with patient slight unique presentation in duration of symptoms. Advised, if symptoms do not resolve in 48 hours, patient should be evaluated by ED or urgent care (PCP and Ob/Gyn will be closed for Thanksgiving holiday) for a repeat UA, culture, and possible further evaluation (based on evolution of symptoms). Patient agrees with plan.   Darlin Priestly, MHS, PA-C Montey Hora, MHS, PA-C Advanced Practice Provider [facility]  [address], [facility], [place], [place] [address] (p):  [phone] [email or web address]

## 2018-02-26 NOTE — Therapy (Signed)
P Patient Details  Name: Sarah Gallagher MRN: [id number] Date of Birth: 23-Sep-1980 Referring Provider (PT): [place]   Encounter Date: 02/26/2018  PT End of Session - 02/26/18 1637    Visit Number  4    Number of Visits  17    Date for PT Re-Evaluation  04/03/18    PT Start Time  0400    PT Stop Time  0445    PT Time Calculation (min)  45 min       Past Medical History:  Diagnosis Date  . Arthritis   . Chest discomfort    a. associated with palpitations.  . Depression   . Dyspnea on exertion    a. 12/2016 Echo: EF 55-60%, no rwma, nl PASP.  Marland Kitchen Juvenile rheumatoid arthritis ([place])   . Meniere disease   . Migraines   . Palpitations   . PVC's (premature ventricular contractions)    a. 01/2017 48h Holter: Rare PAC/PVC. Freq runs of sinus tachycardia.    Past Surgical History:  Procedure Laterality Date  . KNEE SURGERY  1997, 1997   torn meniscus, ACL replacement  . REFRACTIVE SURGERY     lasix    There were no vitals filed for this visit.  Subjective Assessment - 02/26/18 1608    Subjective  Patient reports she is having increased hip pain today following very intense day at work. Patient also reports she has a UTI which has increased her pain.     Pertinent History  Pt is a 37 year old female that has been seen by PCP for left hip bursitis from Oct 2018 until now. Patient reports she has had injections L hip for bursitis and unknow injections in (patient points to SIJ area). Patient reports cheif pain complaint of L hip pain, with secondary complaints of low back pain, R foot pain and cervical pain. Patient reports she saw Nicole Kindred PT for this condition with DN to the piriformis which she reports "helped for a little while" and DN to paraspinals near L4 which she reports increased tension here. Patient reports the majority of her L hip pain is posterior at the buttock and at the L lumbar spine. Does report occassional "catch" in groin with walking. Patient reports her L  buttock/LBP is tender to touch and constantly feels "tight". 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 Patient reports worst pain in the past week 7/10; best 3/10. Pt denies N/V, unexplained weight fluctuation, B&B changes, saddle paresthesia, fever, night sweats, or unrelenting night pain at this time.    Limitations  Sitting;Lifting;Standing;Walking    How long can you sit comfortably?  67min    How long can you stand comfortably?  34min    How long can you walk comfortably?  51min    Diagnostic tests  XRay 1 year ago unremarkable    Patient Stated Goals  Decrease pain, return to exercise regimen (running)    Pain Onset  More than a month ago        Manual - STM withtrigger point releaseto L glute max/min/med and hamstring with manual as well as roller with patient in prone and sidelying - STM with trigger point release to bilat QL with inc time spent on L    ESTIM+ heat packHiVolt ESTIM12 min at patient tolerated130>135V to L glute med/max; 150>155V to L QL, 140>145V R QL. Attempted to decrease tension in this area. With PT  assessing patient tolerance throughout (increasing intensity as needed), monitoring skin integrity (normal), with decreased pain noted from patient. Educated patient during this time on this area creating tension on the tendon, irritating the bursa, to ensure compliance with HEP stretching for carry over  Ther-Ex - Posterior pelvic tilt with TC x20 with 3 sec hold - TA marching x5 each LE lift 1in with TC for maintained LB contact with mat table and patient with visible diastasis recti. - Education on core strengthening as well as pelvic floor strengthening. Through discussion patient reveals some leakage with coughing/sneexing. PT advised patient to complete kegal activation similar to post pelvic tilt utilizing her own hand at her pelvic floor to  feel the sensation of "holding and releasing urine" to ensure activation. Educated patient on over-activation of lumbar ext to compensate for core weakness with her end-of-day task of standing in weighted lead vest in OR - Demo with return demo of seated QL stretch- added to HEP                         PT Education - 02/26/18 1831    Education Details  Core and pelvic floor strengthening and relation to LBP/ over activation of lumbar ext    Person(s) Educated  Patient    Methods  Explanation    Comprehension  Verbalized understanding       PT Short Term Goals - 02/06/18 1250      PT SHORT TERM GOAL #1   Title  Pt will be independent with HEP in order to improve strength and balance in order to decrease fall risk and improve function at home and work.    Time  4    Period  Weeks    Status  New        PT Long Term Goals - 02/06/18 1257      PT LONG TERM GOAL #1   Title  Patient will increase FOTO score to 58 to demonstrate predicted increase in functional mobility to complete ADLs    Baseline  02/06/18 44    Time  8    Period  Weeks    Status  New      PT LONG TERM GOAL #2   Title  Pt will increase 10MWT by at least 0.13 m/s in order to demonstrate clinically significant improvement in community ambulation.     Baseline  02/06/18 1.42m/s    Time  8    Period  Weeks    Status  New      PT LONG TERM GOAL #3   Title  Pt will decrease worst pain as reported on NPRS by at least 3 points in order to demonstrate clinically significant reduction in pain.    Baseline  02/06/18 7/10    Time  8    Period  Weeks    Status  New            Plan - 02/26/18 2218    Clinical Impression Statement  Pt responded well to modality + manual techniques, noted decreased pain and tension. Patient is begininning to localize more pain at lumbar paraspinals and QL, noting that pain here increases after wearing lead vest at the end of the day. Through discussion with pt PT  assesses that core weakness is most likely causing patient ot hold vest up with lumbar ext and over activation of these muscles. PT educated patient on importance of core strengthening to prevent this. Through  core strengthening patient has visable diastisis recti, which she reports she has had sive having her second child. Through more discussion pt reevels sone leakage with coughing and sneezing. PT educated patient on the importance of core strengthening and pelvic floor activation to prevent this and also to add stability to restore proper legth tension relationship to prevent from over activation of lumbar extensors. PT educated patient on posterior pevlic tilt and kegal activation exercises to continue at home. Pt inquired about wearing a lumbar support at the end of the day when she is wearing the weighted vest, and PT advised that this may decrease her pain, but that the "long term goal" would be to strengthen the core muscles as her "internal back brace". Patient verbalized understanding of all provided education.     Rehab Potential  Good    Clinical Impairments Affecting Rehab Potential  (+) young age, motivation, attempting active lifestyle (-) lack of social support currently, psycho social factors, chronicity of pain, multiple pain sites    PT Frequency  2x / week    PT Duration  8 weeks    PT Treatment/Interventions  ADLs/Self Care Home Management;Aquatic Therapy;Moist Heat;Ultrasound;Traction;Cryotherapy;Electrical Stimulation;Therapeutic exercise;Therapeutic activities;Patient/family education;Functional mobility training;Neuromuscular re-education;Passive range of motion;Dry needling;Manual techniques;Taping;Spinal Manipulations    PT Next Visit Plan  HEP and goal review, ST techniques     PT Home Exercise Plan  childs pose, seated piriformis stretch, heat, ball rolling    Consulted and Agree with Plan of Care  Patient       Patient will benefit from skilled therapeutic intervention in  order to improve the following deficits and impairments:  Pain, Improper body mechanics, Increased fascial restricitons, Abnormal gait, Decreased mobility, Decreased activity tolerance, Decreased endurance, Postural dysfunction, Decreased strength, Difficulty walking, Impaired flexibility  Visit Diagnosis: Pain in left hip     Problem List Patient Active Problem List   Diagnosis Date Noted  . Anxiety and depression 11/27/2017  . Greater trochanteric bursitis of left hip 01/31/2017  . Low back pain 01/31/2017  . Nonallopathic lesion of lumbosacral region 01/31/2017  . Nonallopathic lesion of sacral region 01/31/2017  . Nonallopathic lesion of thoracic region 01/31/2017  . Dyspnea on exertion 12/18/2016  . Lower abdominal pain 12/18/2016  . Myalgia 12/18/2016  . Palpitations 12/18/2016  . Headache 12/18/2016  . History of occupational exposure to risk factor 12/18/2016   Shelton Silvas PT, DPT Shelton Silvas 02/26/2018, 10:25 PM  [facility] [address], [place], [address] Phone: [phone]   Fax:  [phone]  Name: ANNSLEIGH DRAGOO MRN: [id number] Date of Birth: 05/28/1980

## 2018-02-26 NOTE — Patient Instructions (Signed)
Thank you for choosing [facility] for your health care needs.  You have been diagnosed with a urinary tract infection.  Take prescription medication as prescribed: Macrobid 100mg . 1 tab by mouth twice a day x 5 days.  Increase fluids. May drink water and/or Cranberry Juice. Empty bladder frequently. May continue to use over-the-counter Azo for symptom relief.  If your symptoms have not resolved in two days, follow-up at the urgent care or at the Emergency Department for further evaluation. You should have a repeat urinalysis and have the urine sent for culture.  Hope you feel better soon.  Urinary Tract Infection, Adult A urinary tract infection (UTI) is an infection of any part of the urinary tract. The urinary tract includes the:  Kidneys.  Ureters.  Bladder.  Urethra.  These organs make, store, and get rid of pee (urine) in the body. Follow these instructions at home:  Take over-the-counter and prescription medicines only as told by your doctor.  If you were prescribed an antibiotic medicine, take it as told by your doctor. Do not stop taking the antibiotic even if you start to feel better.  Avoid the following drinks: ? Alcohol. ? Caffeine. ? Tea. ? Carbonated drinks.  Drink enough fluid to keep your pee clear or pale yellow.  Keep all follow-up visits as told by your doctor. This is important.  Make sure to: ? Empty your bladder often and completely. Do not to hold pee for long periods of time. ? Empty your bladder before and after sex. ? Wipe from front to back after a bowel movement if you are female. Use each tissue one time when you wipe. Contact a doctor if:  You have back pain.  You have a fever.  You feel sick to your stomach (nauseous).  You throw up (vomit).  Your symptoms do not get better after 3 days.  Your symptoms go away and then come back. Get help right away if:  You have very bad back pain.  You have very bad lower belly  (abdominal) pain.  You are throwing up and cannot keep down any medicines or water. This information is not intended to replace advice given to you by your health care provider. Make sure you discuss any questions you have with your health care provider. Document Released: 09/06/2007 Document Revised: 08/26/2015 Document Reviewed: 02/08/2015 Elsevier Interactive Patient Education  [employer].

## 2018-03-04 ENCOUNTER — Ambulatory Visit: Payer: 59 | Attending: Family Medicine | Admitting: Physical Therapy

## 2018-03-04 ENCOUNTER — Encounter: Payer: Self-pay | Admitting: Physical Therapy

## 2018-03-04 ENCOUNTER — Telehealth: Payer: Self-pay | Admitting: Emergency Medicine

## 2018-03-04 DIAGNOSIS — M25552 Pain in left hip: Secondary | ICD-10-CM | POA: Insufficient documentation

## 2018-03-04 NOTE — Telephone Encounter (Signed)
Spoke to patient stated that she is feeling so much better

## 2018-03-04 NOTE — Therapy (Signed)
[facility] [address] [place], [address] Phone: [phone]   Fax:  [phone]  Physical Therapy Treatment  Patient Details  Name: Sarah Gallagher MRN: [id number] Date of Birth: [birth year] Referring Provider (PT): [place]   Encounter Date: 03/04/2018  PT End of Session - 03/04/18 1648    Visit Number  5    Number of Visits  17    Date for PT Re-Evaluation  04/03/18    PT Start Time  0410    PT Stop Time  0500    PT Time Calculation (min)  50 min    Activity Tolerance  Patient tolerated treatment well    Behavior During Therapy  [facility] for tasks assessed/performed       Past Medical History:  Diagnosis Date  . Arthritis   . Chest discomfort    a. associated with palpitations.  . Depression   . Dyspnea on exertion    a. 12/2016 Echo: EF 55-60%, no rwma, nl PASP.  Marland Kitchen Juvenile rheumatoid arthritis ([place])   . Meniere disease   . Migraines   . Palpitations   . PVC's (premature ventricular contractions)    a. 01/2017 48h Holter: Rare PAC/PVC. Freq runs of sinus tachycardia.    Past Surgical History:  Procedure Laterality Date  . KNEE SURGERY  1997, 1997   torn meniscus, ACL replacement  . REFRACTIVE SURGERY     lasix    There were no vitals filed for this visit.  Subjective Assessment - 03/04/18 1615    Subjective  Patient reports her hip was a little better over the weekend. Patient reports her hip is tight today, but she did do a lot yesterday. Patient reports bilat QL soreness today that felt better after ESTIM    Pertinent History  Pt is a 37 year old female that has been seen by PCP for left hip bursitis from Oct 2018 until now. Patient reports she has had injections L hip for bursitis and unknow injections in (patient points to SIJ area). Patient reports cheif pain complaint of L hip pain, with secondary complaints of low back pain, R foot pain and cervical pain. Patient reports she saw  Nicole Kindred PT for this condition with DN to the piriformis which she reports "helped for a little while" and DN to paraspinals near L4 which she reports increased tension here. Patient reports the majority of her L hip pain is posterior at the buttock and at the L lumbar spine. Does report occassional "catch" in groin with walking. Patient reports her L buttock/LBP is tender to touch and constantly feels "tight". 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 Patient reports worst pain in the past week 7/10; best 3/10. Pt denies N/V, unexplained weight fluctuation, B&B changes, saddle paresthesia, fever, night sweats, or unrelenting night pain at this time.    Limitations  Sitting;Lifting;Standing;Walking    How long can you sit comfortably?  76min    How long can you stand comfortably?  48min    How long can you walk comfortably?  41min    Diagnostic tests  XRay 1 year ago unremarkable    Patient Stated Goals  Decrease pain, return to exercise regimen (running)    Pain Onset  More than a month ago         Manual - STM withtrigger point releaseto L glute max/min/med; increased time  spent at ant fibers - STM with trigger point release to bilat QL with inc time spent on L   ESTIM+ heat packHiVolt ESTIM41min at patient tolerated175>135V to L glute med/max; 105>155V to L QL, 150>145V R QL. Attemptedto decrease tension in this area. With PT assessing patient tolerance throughout (increasing intensity as needed), monitoring skin integrity (normal), with decreased pain noted from patient. Education on continued core contraction practice with carry over into ADLs (STS)  Ther-Ex - Posterior pelvic tilt with TC x20 with 3 sec hold with good carry over from previous session with min cuing to prevent excessive glute contraction compensation - Attempted TA marching from mat table, pt unable to  complete keeping low back in contact with the mat table. From 6in step 3x 6 with TC to maintain back contact with table - Plank 2x 10sec hold following demo and form correction  - Bridge x10 with cuing for sustained core contraction; with feet on bosu ball with controlled lower 2x 10                         PT Education - 03/04/18 1638    Education Details  Exercise form    Person(s) Educated  Patient    Methods  Explanation;Verbal cues    Comprehension  Verbalized understanding;Verbal cues required       PT Short Term Goals - 02/06/18 1250      PT SHORT TERM GOAL #1   Title  Pt will be independent with HEP in order to improve strength and balance in order to decrease fall risk and improve function at home and work.    Time  4    Period  Weeks    Status  New        PT Long Term Goals - 02/06/18 1257      PT LONG TERM GOAL #1   Title  Patient will increase FOTO score to 58 to demonstrate predicted increase in functional mobility to complete ADLs    Baseline  02/06/18 44    Time  8    Period  Weeks    Status  New      PT LONG TERM GOAL #2   Title  Pt will increase 10MWT by at least 0.13 m/s in order to demonstrate clinically significant improvement in community ambulation.     Baseline  02/06/18 1.29m/s    Time  8    Period  Weeks    Status  New      PT LONG TERM GOAL #3   Title  Pt will decrease worst pain as reported on NPRS by at least 3 points in order to demonstrate clinically significant reduction in pain.    Baseline  02/06/18 7/10    Time  8    Period  Weeks    Status  New            Plan - 03/04/18 1708    Clinical Impression Statement  PT progressed core strengthening with patient able to tolerate progression well with no increased pain, and proper form following PT cuing and modifications. Patient is continuing to have good tension relief following ESTIM + manual techniques. PT educated patient on continuing core activation practice  for carry over; pt verbalized understanding.     Rehab Potential  Good    Clinical Impairments Affecting Rehab Potential  (+) young age, motivation, attempting active lifestyle (-) lack of social support currently, psycho social factors, chronicity of  pain, multiple pain sites    PT Frequency  2x / week    PT Duration  8 weeks    PT Treatment/Interventions  ADLs/Self Care Home Management;Aquatic Therapy;Moist Heat;Ultrasound;Traction;Cryotherapy;Electrical Stimulation;Therapeutic exercise;Therapeutic activities;Patient/family education;Functional mobility training;Neuromuscular re-education;Passive range of motion;Dry needling;Manual techniques;Taping;Spinal Manipulations    PT Next Visit Plan  HEP and goal review, ST techniques     PT Home Exercise Plan  childs pose, seated piriformis stretch, heat, ball rolling    Consulted and Agree with Plan of Care  Patient       Patient will benefit from skilled therapeutic intervention in order to improve the following deficits and impairments:  Pain, Improper body mechanics, Increased fascial restricitons, Abnormal gait, Decreased mobility, Decreased activity tolerance, Decreased endurance, Postural dysfunction, Decreased strength, Difficulty walking, Impaired flexibility  Visit Diagnosis: Pain in left hip     Problem List Patient Active Problem List   Diagnosis Date Noted  . Anxiety and depression 11/27/2017  . Greater trochanteric bursitis of left hip 01/31/2017  . Low back pain 01/31/2017  . Nonallopathic lesion of lumbosacral region 01/31/2017  . Nonallopathic lesion of sacral region 01/31/2017  . Nonallopathic lesion of thoracic region 01/31/2017  . Dyspnea on exertion 12/18/2016  . Lower abdominal pain 12/18/2016  . Myalgia 12/18/2016  . Palpitations 12/18/2016  . Headache 12/18/2016  . History of occupational exposure to risk factor 12/18/2016   Shelton Silvas PT, DPT Shelton Silvas 03/04/2018, 5:11 PM  [facility] [address] [place], [address] Phone: [phone]   Fax:  [phone]  Name: Sarah Gallagher MRN: [id number] Date of Birth: 03/06/1981

## 2018-03-06 ENCOUNTER — Ambulatory Visit: Payer: 59 | Admitting: Physical Therapy

## 2018-03-06 ENCOUNTER — Encounter: Payer: Self-pay | Admitting: Physical Therapy

## 2018-03-06 DIAGNOSIS — M25552 Pain in left hip: Secondary | ICD-10-CM | POA: Diagnosis not present

## 2018-03-06 NOTE — Therapy (Signed)
[facility] [address] [place], [address] Phone: [phone]   Fax:  [phone]  Physical Therapy Treatment  Patient Details  Name: Sarah Gallagher MRN: [id number] Date of Birth: [birth year] Referring Provider (PT): [place]   Encounter Date: 03/06/2018  PT End of Session - 03/06/18 1637    Visit Number  6    Number of Visits  17    Date for PT Re-Evaluation  04/03/18    PT Start Time  0405    PT Stop Time  0445    PT Time Calculation (min)  40 min    Activity Tolerance  Patient tolerated treatment well    Behavior During Therapy  [facility] for tasks assessed/performed       Past Medical History:  Diagnosis Date  . Arthritis   . Chest discomfort    a. associated with palpitations.  . Depression   . Dyspnea on exertion    a. 12/2016 Echo: EF 55-60%, no rwma, nl PASP.  Marland Kitchen Juvenile rheumatoid arthritis ([place])   . Meniere disease   . Migraines   . Palpitations   . PVC's (premature ventricular contractions)    a. 01/2017 48h Holter: Rare PAC/PVC. Freq runs of sinus tachycardia.    Past Surgical History:  Procedure Laterality Date  . KNEE SURGERY  1997, 1997   torn meniscus, ACL replacement  . REFRACTIVE SURGERY     lasix    There were no vitals filed for this visit.  Subjective Assessment - 03/06/18 1610    Subjective  Patient reports her hip was a little better over the weekend. Patient reports her hip is tight today, but she did do a lot yesterday. Patient reports bilat QL soreness today that felt better after ESTIM  (Pended)     Pertinent History  Pt is a 37 year old female that has been seen by PCP for left hip bursitis from Oct 2018 until now. Patient reports she has had injections L hip for bursitis and unknow injections in (patient points to SIJ area). Patient reports cheif pain complaint of L hip pain, with secondary complaints of low back pain, R foot pain and cervical pain. Patient reports  she saw Nicole Kindred PT for this condition with DN to the piriformis which she reports "helped for a little while" and DN to paraspinals near L4 which she reports increased tension here. Patient reports the majority of her L hip pain is posterior at the buttock and at the L lumbar spine. Does report occassional "catch" in groin with walking. Patient reports her L buttock/LBP is tender to touch and constantly feels "tight". 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 Patient reports worst pain in the past week 7/10; best 3/10. Pt denies N/V, unexplained weight fluctuation, B&B changes, saddle paresthesia, fever, night sweats, or unrelenting night pain at this time.  (Pended)     Limitations  Sitting;Lifting;Standing;Walking  (Pended)     How long can you sit comfortably?  31min  (Pended)     How long can you stand comfortably?  21min  (Pended)     How long can you walk comfortably?  50min  (Pended)     Diagnostic tests  XRay 1 year ago unremarkable  (Pended)        Manual - STM withtrigger point releaseto L glute med/max during ESTIM -STM withtrigger point releaseto bilat QL  with inc time spent on L. Following: (2/2) 178mm .30 needles placed along bilat QL to decrease increased muscular spasms and trigger points with the patient positioned in sidelying. Patient was educated on risks and benefits of therapy and verbally consents to PT.   ESTIM+ heat packHiVolt ESTIM95min at patient tolerated 135V at bilat QL. Attemptedto decrease tension in this area. With PT assessing patient tolerance throughout (increasing intensity as needed), monitoring skin integrity (normal), with decreased pain noted from patient. glute med/max soft tissue techniques during this time.   Ther-Ex - TA marching from 6in step 3x 6; 2x 8 with min initially to maintain back contact with table, with good carry over noted  from previous sessions - Hooklying oblique windshield wipers x10 each direction with demo and cuing initially for proper activation with good carry over following; adding manual resistance from therapist x10 each direction                        PT Education - 03/06/18 1636    Education Details  Exercise form; TDN education    Person(s) Educated  Patient    Methods  Explanation;Verbal cues;Tactile cues    Comprehension  Verbalized understanding;Verbal cues required;Tactile cues required       PT Short Term Goals - 02/06/18 1250      PT SHORT TERM GOAL #1   Title  Pt will be independent with HEP in order to improve strength and balance in order to decrease fall risk and improve function at home and work.    Time  4    Period  Weeks    Status  New        PT Long Term Goals - 02/06/18 1257      PT LONG TERM GOAL #1   Title  Patient will increase FOTO score to 58 to demonstrate predicted increase in functional mobility to complete ADLs    Baseline  02/06/18 44    Time  8    Period  Weeks    Status  New      PT LONG TERM GOAL #2   Title  Pt will increase 10MWT by at least 0.13 m/s in order to demonstrate clinically significant improvement in community ambulation.     Baseline  02/06/18 1.70m/s    Time  8    Period  Weeks    Status  New      PT LONG TERM GOAL #3   Title  Pt will decrease worst pain as reported on NPRS by at least 3 points in order to demonstrate clinically significant reduction in pain.    Baseline  02/06/18 7/10    Time  8    Period  Weeks    Status  New            Plan - 03/06/18 1900    Clinical Impression Statement  PT completed TDN adjunct to manual techniques, with good localized twitch response. PT continued therex progression to include oblique muscle activation, which patient is able to complete with accuracy following PT cuing. PT will continue to gauge pain response following TDN techniques    Rehab Potential  Good     Clinical Impairments Affecting Rehab Potential  (+) young age, motivation, attempting active lifestyle (-) lack of social support currently, psycho social factors, chronicity of pain, multiple pain sites    PT Frequency  2x / week    PT Duration  8 weeks    PT  Treatment/Interventions  ADLs/Self Care Home Management;Aquatic Therapy;Moist Heat;Ultrasound;Traction;Cryotherapy;Electrical Stimulation;Therapeutic exercise;Therapeutic activities;Patient/family education;Functional mobility training;Neuromuscular re-education;Passive range of motion;Dry needling;Manual techniques;Taping;Spinal Manipulations    PT Next Visit Plan  core activation, ST techniques     PT Home Exercise Plan  childs pose, seated piriformis stretch, heat, ball rolling    Consulted and Agree with Plan of Care  Patient       Patient will benefit from skilled therapeutic intervention in order to improve the following deficits and impairments:  Pain, Improper body mechanics, Increased fascial restricitons, Abnormal gait, Decreased mobility, Decreased activity tolerance, Decreased endurance, Postural dysfunction, Decreased strength, Difficulty walking, Impaired flexibility  Visit Diagnosis: Pain in left hip     Problem List Patient Active Problem List   Diagnosis Date Noted  . Anxiety and depression 11/27/2017  . Greater trochanteric bursitis of left hip 01/31/2017  . Low back pain 01/31/2017  . Nonallopathic lesion of lumbosacral region 01/31/2017  . Nonallopathic lesion of sacral region 01/31/2017  . Nonallopathic lesion of thoracic region 01/31/2017  . Dyspnea on exertion 12/18/2016  . Lower abdominal pain 12/18/2016  . Myalgia 12/18/2016  . Palpitations 12/18/2016  . Headache 12/18/2016  . History of occupational exposure to risk factor 12/18/2016   Shelton Silvas PT, DPT  Shelton Silvas 03/06/2018, 7:19 PM  [facility] [address], [place], [address] Phone: [phone]   Fax:  [phone]  Name: Sarah Gallagher MRN: [id number] Date of Birth: [birth year]

## 2018-03-11 ENCOUNTER — Encounter: Payer: Self-pay | Admitting: Physical Therapy

## 2018-03-11 ENCOUNTER — Ambulatory Visit: Payer: 59 | Admitting: Physical Therapy

## 2018-03-11 DIAGNOSIS — M25552 Pain in left hip: Secondary | ICD-10-CM | POA: Diagnosis not present

## 2018-03-11 NOTE — Therapy (Signed)
[facility] [address] [place], [address] Phone: [phone]   Fax:  [phone]  Physical Therapy Treatment  Patient Details  Name: Sarah Gallagher MRN: [id number] Date of Birth: 09/25/1980 Referring Provider (PT): [place]   Encounter Date: 03/11/2018  PT End of Session - 03/11/18 1651    Visit Number  7    Number of Visits  17    Date for PT Re-Evaluation  04/03/18    PT Start Time  0405    PT Stop Time  0450    PT Time Calculation (min)  45 min    Activity Tolerance  Patient tolerated treatment well    Behavior During Therapy  [facility] for tasks assessed/performed       Past Medical History:  Diagnosis Date  . Arthritis   . Chest discomfort    a. associated with palpitations.  . Depression   . Dyspnea on exertion    a. 12/2016 Echo: EF 55-60%, no rwma, nl PASP.  Marland Kitchen Juvenile rheumatoid arthritis ([place])   . Meniere disease   . Migraines   . Palpitations   . PVC's (premature ventricular contractions)    a. 01/2017 48h Holter: Rare PAC/PVC. Freq runs of sinus tachycardia.    Past Surgical History:  Procedure Laterality Date  . KNEE SURGERY  1997, 1997   torn meniscus, ACL replacement  . REFRACTIVE SURGERY     lasix    There were no vitals filed for this visit.  Subjective Assessment - 03/11/18 1615    Subjective  Patient reports her back pain has been much better (90% better) since last session, which she is very pleased with. patient reports post hip and hamstring pain that she describes as "tension" 5/10. Patient reports compliance with his HEP with no questions or concerns.     Pertinent History  Pt is a 37 year old female that has been seen by PCP for left hip bursitis from Oct 2018 until now. Patient reports she has had injections L hip for bursitis and unknow injections in (patient points to SIJ area). Patient reports cheif pain complaint of L hip pain, with secondary complaints of low  back pain, R foot pain and cervical pain. Patient reports she saw Nicole Kindred PT for this condition with DN to the piriformis which she reports "helped for a little while" and DN to paraspinals near L4 which she reports increased tension here. Patient reports the majority of her L hip pain is posterior at the buttock and at the L lumbar spine. Does report occassional "catch" in groin with walking. Patient reports her L buttock/LBP is tender to touch and constantly feels "tight". 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 Patient reports worst pain in the past week 7/10; best 3/10. Pt denies N/V, unexplained weight fluctuation, B&B changes, saddle paresthesia, fever, night sweats, or unrelenting night pain at this time.    Limitations  Sitting;Lifting;Standing;Walking    How long can you sit comfortably?  56min    How long can you stand comfortably?  108min    How long can you walk comfortably?  18min    Diagnostic tests  XRay 1 year ago unremarkable    Patient Stated Goals  Decrease pain, return to exercise regimen (running)    Pain Onset  More than a month ago       Manual - STM withtrigger  point releaseto L glute med/max and L hamstring with increased trigger points at lateral fibers - Manual hamstring stretch 30sec hold - L hamstring contract relax 10sec contract/10 sec relax x10 inc ROM as able -STM withtrigger point releaseto bilat QL with inc time spent on L. Following: (3) 51mm .30 needles placed along L lateral hamstringto decrease increased muscular spasms and trigger points with the patient positioned in prone. Patient was educated on risks and benefits of therapy and verbally consents to PT.   ESTIM+ heat packHiVolt ESTIM64min at patient tolerated 135V at bilat QL. Attemptedto decrease tension in this area. With PT assessing patient tolerance throughout (increasing  intensity as needed), monitoring skin integrity (normal), with decreased pain noted from patient. Educated patient on continuing core activation/strengthening at home, which patient verbalized understanding of. Patient reports she is attempting to activate her core more with standing and PT reviewed proper posture vs. "hanging on her Y's"                           PT Education - 03/11/18 1650    Education Details  TDN education, continued stretching/HEP compliance for maintainence    Person(s) Educated  Patient    Methods  Explanation    Comprehension  Verbalized understanding       PT Short Term Goals - 02/06/18 1250      PT SHORT TERM GOAL #1   Title  Pt will be independent with HEP in order to improve strength and balance in order to decrease fall risk and improve function at home and work.    Time  4    Period  Weeks    Status  New        PT Long Term Goals - 02/06/18 1257      PT LONG TERM GOAL #1   Title  Patient will increase FOTO score to 58 to demonstrate predicted increase in functional mobility to complete ADLs    Baseline  02/06/18 44    Time  8    Period  Weeks    Status  New      PT LONG TERM GOAL #2   Title  Pt will increase 10MWT by at least 0.13 m/s in order to demonstrate clinically significant improvement in community ambulation.     Baseline  02/06/18 1.47m/s    Time  8    Period  Weeks    Status  New      PT LONG TERM GOAL #3   Title  Pt will decrease worst pain as reported on NPRS by at least 3 points in order to demonstrate clinically significant reduction in pain.    Baseline  02/06/18 7/10    Time  8    Period  Weeks    Status  New            Plan - 03/11/18 2144    Clinical Impression Statement  Patient reports good, lasting pain relief post TDN to QL. Attempted again today for hamstring pain/tension with localized twitch response and decreased pain from patient noted following. Patient inquired about TDN for  glute/deep ER musculature as well as she thinks this is helping with her pain management. PT agrees this would be a good intervention as patient continues to have tension in this area despite HEP stretching. PT utilized increased manual techniques this session as patient was having more peripheral muscle tension. PT continued to encourage core activation/strengthening at home and will  re-visit next session as able.     Rehab Potential  Good    Clinical Impairments Affecting Rehab Potential  (+) young age, motivation, attempting active lifestyle (-) lack of social support currently, psycho social factors, chronicity of pain, multiple pain sites    PT Frequency  2x / week    PT Duration  8 weeks    PT Treatment/Interventions  ADLs/Self Care Home Management;Aquatic Therapy;Moist Heat;Ultrasound;Traction;Cryotherapy;Electrical Stimulation;Therapeutic exercise;Therapeutic activities;Patient/family education;Functional mobility training;Neuromuscular re-education;Passive range of motion;Dry needling;Manual techniques;Taping;Spinal Manipulations    PT Next Visit Plan  core activation, ST techniques, glute/hip ER TDN    PT Home Exercise Plan  childs pose, seated piriformis stretch, heat, ball rolling    Consulted and Agree with Plan of Care  Patient       Patient will benefit from skilled therapeutic intervention in order to improve the following deficits and impairments:  Pain, Improper body mechanics, Increased fascial restricitons, Abnormal gait, Decreased mobility, Decreased activity tolerance, Decreased endurance, Postural dysfunction, Decreased strength, Difficulty walking, Impaired flexibility  Visit Diagnosis: Pain in left hip     Problem List Patient Active Problem List   Diagnosis Date Noted  . Anxiety and depression 11/27/2017  . Greater trochanteric bursitis of left hip 01/31/2017  . Low back pain 01/31/2017  . Nonallopathic lesion of lumbosacral region 01/31/2017  . Nonallopathic  lesion of sacral region 01/31/2017  . Nonallopathic lesion of thoracic region 01/31/2017  . Dyspnea on exertion 12/18/2016  . Lower abdominal pain 12/18/2016  . Myalgia 12/18/2016  . Palpitations 12/18/2016  . Headache 12/18/2016  . History of occupational exposure to risk factor 12/18/2016   Shelton Silvas PT, DPT Shelton Silvas 03/11/2018, 10:06 PM  [facility] [address], [place], [address] Phone: [phone]   Fax:  [phone]  Name: Sarah Gallagher MRN: [id number] Date of Birth: 1980/10/20

## 2018-03-13 ENCOUNTER — Encounter: Payer: Self-pay | Admitting: Physical Therapy

## 2018-03-13 ENCOUNTER — Ambulatory Visit: Payer: 59 | Admitting: Physical Therapy

## 2018-03-13 DIAGNOSIS — M25552 Pain in left hip: Secondary | ICD-10-CM | POA: Diagnosis not present

## 2018-03-13 NOTE — Therapy (Signed)
[facility] [address] [place], [address] Phone: [phone]   Fax:  [phone]  Physical Therapy Treatment  Patient Details  Name: Sarah Gallagher MRN: [id number] Date of Birth: [birth year] Referring Provider (PT): [place]   Encounter Date: 03/13/2018  PT End of Session - 03/14/18 1404    Visit Number  8    Number of Visits  17    Date for PT Re-Evaluation  04/03/18    PT Start Time  0408    PT Stop Time  0453    PT Time Calculation (min)  45 min    Activity Tolerance  Patient tolerated treatment well    Behavior During Therapy  [facility] for tasks assessed/performed       Past Medical History:  Diagnosis Date  . Arthritis   . Chest discomfort    a. associated with palpitations.  . Depression   . Dyspnea on exertion    a. 12/2016 Echo: EF 55-60%, no rwma, nl PASP.  Marland Kitchen Juvenile rheumatoid arthritis ([place])   . Meniere disease   . Migraines   . Palpitations   . PVC's (premature ventricular contractions)    a. 01/2017 48h Holter: Rare PAC/PVC. Freq runs of sinus tachycardia.    Past Surgical History:  Procedure Laterality Date  . KNEE SURGERY  1997, 1997   torn meniscus, ACL replacement  . REFRACTIVE SURGERY     lasix    There were no vitals filed for this visit.  Subjective Assessment - 03/13/18 1612    Subjective  Patient reports the L side of her back feels "tight" but not very painful. Patient reports her L "deep buttock and hamstrings feel tight, but the hamstrings feel looser after TDN". Patient reports 6/10 pain today. Reports compliance with HEP.     Pertinent History  Pt is a 37 year old female that has been seen by PCP for left hip bursitis from Oct 2018 until now. Patient reports she has had injections L hip for bursitis and unknow injections in (patient points to SIJ area). Patient reports cheif pain complaint of L hip pain, with secondary complaints of low back pain, R foot pain and  cervical pain. Patient reports she saw Nicole Kindred PT for this condition with DN to the piriformis which she reports "helped for a little while" and DN to paraspinals near L4 which she reports increased tension here. Patient reports the majority of her L hip pain is posterior at the buttock and at the L lumbar spine. Does report occassional "catch" in groin with walking. Patient reports her L buttock/LBP is tender to touch and constantly feels "tight". 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 Patient reports worst pain in the past week 7/10; best 3/10. Pt denies N/V, unexplained weight fluctuation, B&B changes, saddle paresthesia, fever, night sweats, or unrelenting night pain at this time.    Limitations  Sitting;Lifting;Standing;Walking    How long can you sit comfortably?  36min    How long can you stand comfortably?  61min    How long can you walk comfortably?  33min    Diagnostic tests  XRay 1 year ago unremarkable    Patient Stated Goals  Decrease pain, return to exercise regimen (running)    Pain Onset  More than a month ago       Manual - STM withtrigger point releaseto L QL,  L glutemin/max/deep hip ER, L glute med, and L hamstring with increased trigger points at lateral fibers  Following:(3)15mm .30needles placed along L lateral hamstring, (2)82mm .30needles placed along L QL, and (2)187mm .30needles placed along L glute max/med/piriformis with patient positioned in prone to decrease increased muscular spasms and trigger points with the patient positioned in prone. Patient was educated on risks and benefits of therapy and verbally consents to PT. - Grade I-II CPA L3-S1 30sec bouts 4 bouts each segment for pain modulation  ESTIM+ heat packHiVolt ESTIM83min at patient tolerated 145V at L QL and glute max/min. Attemptedto decrease tension in this area. With PT assessing  patient tolerance throughout (increasing intensity as needed), monitoring skin integrity (normal), with decreased pain noted from patient. Discussed standing posture with patient where patient admits she bears most weight on LLE in her resting stance. Encouraged patient to stand with even weight distribution to reduce asymmetrical tension. Encouraged HEP compliance                         PT Education - 03/14/18 1402    Education Details  Continued TDN edcuation    Person(s) Educated  Patient    Methods  Explanation    Comprehension  Verbalized understanding       PT Short Term Goals - 02/06/18 1250      PT SHORT TERM GOAL #1   Title  Pt will be independent with HEP in order to improve strength and balance in order to decrease fall risk and improve function at home and work.    Time  4    Period  Weeks    Status  New        PT Long Term Goals - 02/06/18 1257      PT LONG TERM GOAL #1   Title  Patient will increase FOTO score to 58 to demonstrate predicted increase in functional mobility to complete ADLs    Baseline  02/06/18 44    Time  8    Period  Weeks    Status  New      PT LONG TERM GOAL #2   Title  Pt will increase 10MWT by at least 0.13 m/s in order to demonstrate clinically significant improvement in community ambulation.     Baseline  02/06/18 1.29m/s    Time  8    Period  Weeks    Status  New      PT LONG TERM GOAL #3   Title  Pt will decrease worst pain as reported on NPRS by at least 3 points in order to demonstrate clinically significant reduction in pain.    Baseline  02/06/18 7/10    Time  8    Period  Weeks    Status  New            Plan - 03/14/18 1407    Clinical Impression Statement  Pt reports some increased tension today following increased activity at work. All of patient's pain is on the L side of the posterior chain, which PT believes is from standing posture following discussion with patient. PT encouraged patient to  attempt to stand evenly on bilat LE throughout her day, which she verbalized understanding of. Following manual and modality techniques patient reports no pain. PT encouraged HEP to maintain gains made through session. PT will attempt to continue therex progression next week as pain allows.     Rehab Potential  Good    Clinical  Impairments Affecting Rehab Potential  (+) young age, motivation, attempting active lifestyle (-) lack of social support currently, psycho social factors, chronicity of pain, multiple pain sites    PT Frequency  2x / week    PT Duration  8 weeks    PT Treatment/Interventions  ADLs/Self Care Home Management;Aquatic Therapy;Moist Heat;Ultrasound;Traction;Cryotherapy;Electrical Stimulation;Therapeutic exercise;Therapeutic activities;Patient/family education;Functional mobility training;Neuromuscular re-education;Passive range of motion;Dry needling;Manual techniques;Taping;Spinal Manipulations    PT Next Visit Plan  core activation, ST techniques, glute/hip ER TDN    PT Home Exercise Plan  childs pose, seated piriformis stretch, heat, ball rolling    Consulted and Agree with Plan of Care  Patient       Patient will benefit from skilled therapeutic intervention in order to improve the following deficits and impairments:  Pain, Improper body mechanics, Increased fascial restricitons, Abnormal gait, Decreased mobility, Decreased activity tolerance, Decreased endurance, Postural dysfunction, Decreased strength, Difficulty walking, Impaired flexibility  Visit Diagnosis: Pain in left hip     Problem List Patient Active Problem List   Diagnosis Date Noted  . Anxiety and depression 11/27/2017  . Greater trochanteric bursitis of left hip 01/31/2017  . Low back pain 01/31/2017  . Nonallopathic lesion of lumbosacral region 01/31/2017  . Nonallopathic lesion of sacral region 01/31/2017  . Nonallopathic lesion of thoracic region 01/31/2017  . Dyspnea on exertion 12/18/2016  .  Lower abdominal pain 12/18/2016  . Myalgia 12/18/2016  . Palpitations 12/18/2016  . Headache 12/18/2016  . History of occupational exposure to risk factor 12/18/2016   Shelton Silvas PT, DPT Shelton Silvas 03/14/2018, 2:13 PM  [facility] [address] [place], [address] Phone: [phone]   Fax:  [phone]  Name: ANNY SAYLER MRN: [id number] Date of Birth: 08-Mar-1981

## 2018-03-14 ENCOUNTER — Encounter: Payer: Self-pay | Admitting: Physical Therapy

## 2018-03-18 ENCOUNTER — Encounter: Payer: Self-pay | Admitting: Physical Therapy

## 2018-03-18 ENCOUNTER — Ambulatory Visit: Payer: 59 | Admitting: Physical Therapy

## 2018-03-18 DIAGNOSIS — M25552 Pain in left hip: Secondary | ICD-10-CM

## 2018-03-18 NOTE — Therapy (Signed)
[facility] [address] [place], [address] Phone: [phone]   Fax:  [phone]  Physical Therapy Treatment  Patient Details  Name: Sarah Gallagher MRN: [id number] Date of Birth: 1980-12-31 Referring Provider (PT): [place]   Encounter Date: 03/18/2018  PT End of Session - 03/19/18 1102    Visit Number  9    Number of Visits  17    Date for PT Re-Evaluation  04/03/18    PT Start Time  0400    PT Stop Time  0440    PT Time Calculation (min)  40 min    Activity Tolerance  Patient tolerated treatment well    Behavior During Therapy  [facility] for tasks assessed/performed       Past Medical History:  Diagnosis Date  . Arthritis   . Chest discomfort    a. associated with palpitations.  . Depression   . Dyspnea on exertion    a. 12/2016 Echo: EF 55-60%, no rwma, nl PASP.  Marland Kitchen Juvenile rheumatoid arthritis ([place])   . Meniere disease   . Migraines   . Palpitations   . PVC's (premature ventricular contractions)    a. 01/2017 48h Holter: Rare PAC/PVC. Freq runs of sinus tachycardia.    Past Surgical History:  Procedure Laterality Date  . KNEE SURGERY  1997, 1997   torn meniscus, ACL replacement  . REFRACTIVE SURGERY     lasix    There were no vitals filed for this visit.  Subjective Assessment - 03/18/18 1611    Subjective  Patient reports minimal pain today, only tension in the L buttock and hamstring that felt better following DN. Patient reports deligence with HEP with no questions or concerns.     Pertinent History  Pt is a 37 year old female that has been seen by PCP for left hip bursitis from Oct 2018 until now. Patient reports she has had injections L hip for bursitis and unknow injections in (patient points to SIJ area). Patient reports cheif pain complaint of L hip pain, with secondary complaints of low back pain, R foot pain and cervical pain. Patient reports she saw Nicole Kindred PT for this  condition with DN to the piriformis which she reports "helped for a little while" and DN to paraspinals near L4 which she reports increased tension here. Patient reports the majority of her L hip pain is posterior at the buttock and at the L lumbar spine. Does report occassional "catch" in groin with walking. Patient reports her L buttock/LBP is tender to touch and constantly feels "tight". 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 Patient reports worst pain in the past week 7/10; best 3/10. Pt denies N/V, unexplained weight fluctuation, B&B changes, saddle paresthesia, fever, night sweats, or unrelenting night pain at this time.    Limitations  Sitting;Lifting;Standing;Walking    How long can you sit comfortably?  42min    How long can you stand comfortably?  68min    How long can you walk comfortably?  62min    Diagnostic tests  XRay 1 year ago unremarkable    Patient Stated Goals  Decrease pain, return to exercise regimen (running)    Pain Onset  More than a month ago       Ther-Ex -Patient reports "pain" at ant/lateral trunk, PT assesses region and localizes soreness with tension to bilat oblique musculature. PT  educated patient on muscle soreness vs. Pain, and encouraged patient that PT has been working to activate those muscles and they may be sore post session/HEP - Palloff press 10# 3x 10 each side with demo and cuing initially with good carry over following   Manual - STM withtrigger point releaseto L QL, L glutemin/max/deep hip ER, L glute med,and L hamstring with increased trigger points at lateral fibers  Following:(3)34mm .30needles placed alongL lateral hamstring, and (2)174mm .30needles placed alongL glute max/med/piriformis with patient positioned in prone to decrease increased muscular spasms and trigger points with the patient positioned inprone.Patient was  educated on risks and benefits of therapy and verbally consents to PT. - Grade I-II CPA L3-S1 30sec bouts 4 bouts each segment for pain modulation  ESTIM+ heat packHiVolt ESTIM9min at patient tolerated 230V at L glute max/min and abd. Attemptedto decrease tension in this area. With PT assessing patient tolerance throughout (increasing intensity as needed), monitoring skin integrity (normal), with decreased pain noted from patient. Utilized time for STM to [employer]                         PT Education - 03/19/18 1101    Education Details  Exercise form, education on muscle soreness vs. threatening pain    Person(s) Educated  Patient    Methods  Explanation;Demonstration    Comprehension  Verbalized understanding;Returned demonstration;Verbal cues required       PT Short Term Goals - 02/06/18 1250      PT SHORT TERM GOAL #1   Title  Pt will be independent with HEP in order to improve strength and balance in order to decrease fall risk and improve function at home and work.    Time  4    Period  Weeks    Status  New        PT Long Term Goals - 02/06/18 1257      PT LONG TERM GOAL #1   Title  Patient will increase FOTO score to 58 to demonstrate predicted increase in functional mobility to complete ADLs    Baseline  02/06/18 44    Time  8    Period  Weeks    Status  New      PT LONG TERM GOAL #2   Title  Pt will increase 10MWT by at least 0.13 m/s in order to demonstrate clinically significant improvement in community ambulation.     Baseline  02/06/18 1.22m/s    Time  8    Period  Weeks    Status  New      PT LONG TERM GOAL #3   Title  Pt will decrease worst pain as reported on NPRS by at least 3 points in order to demonstrate clinically significant reduction in pain.    Baseline  02/06/18 7/10    Time  8    Period  Weeks    Status  New            Plan - 03/19/18 1115    Clinical Impression Statement  Patient continue to report tension  relief with noted decreased soft tissue tension following. PT continued therex progression with WB demand, which patient is able to complete accurately following cuing with no increased pain. PT and patient discussed pain vs. muscle soreness, which patient verbalized understanding. PT will continue progression and pain management as able.     Rehab Potential  Good    Clinical Impairments Affecting Rehab Potential  (+)  young age, motivation, attempting active lifestyle (-) lack of social support currently, psycho social factors, chronicity of pain, multiple pain sites    PT Frequency  2x / week    PT Treatment/Interventions  ADLs/Self Care Home Management;Aquatic Therapy;Moist Heat;Ultrasound;Traction;Cryotherapy;Electrical Stimulation;Therapeutic exercise;Therapeutic activities;Patient/family education;Functional mobility training;Neuromuscular re-education;Passive range of motion;Dry needling;Manual techniques;Taping;Spinal Manipulations    PT Next Visit Plan  core activation, ST techniques, glute/hip ER TDN    PT Home Exercise Plan  childs pose, seated piriformis stretch, heat, ball rolling    Consulted and Agree with Plan of Care  Patient       Patient will benefit from skilled therapeutic intervention in order to improve the following deficits and impairments:  Pain, Improper body mechanics, Increased fascial restricitons, Abnormal gait, Decreased mobility, Decreased activity tolerance, Decreased endurance, Postural dysfunction, Decreased strength, Difficulty walking, Impaired flexibility  Visit Diagnosis: Pain in left hip     Problem List Patient Active Problem List   Diagnosis Date Noted  . Anxiety and depression 11/27/2017  . Greater trochanteric bursitis of left hip 01/31/2017  . Low back pain 01/31/2017  . Nonallopathic lesion of lumbosacral region 01/31/2017  . Nonallopathic lesion of sacral region 01/31/2017  . Nonallopathic lesion of thoracic region 01/31/2017  . Dyspnea on  exertion 12/18/2016  . Lower abdominal pain 12/18/2016  . Myalgia 12/18/2016  . Palpitations 12/18/2016  . Headache 12/18/2016  . History of occupational exposure to risk factor 12/18/2016   Shelton Silvas PT, DPT Shelton Silvas 03/19/2018, 3:58 PM  [facility] [address], [place], [address] Phone: [phone]   Fax:  [phone]  Name: Sarah Gallagher MRN: [id number] Date of Birth: 08/23/1980

## 2018-03-20 ENCOUNTER — Ambulatory Visit: Payer: 59 | Admitting: Physical Therapy

## 2018-03-25 ENCOUNTER — Ambulatory Visit: Payer: 59 | Admitting: Physical Therapy

## 2018-03-25 ENCOUNTER — Encounter: Payer: Self-pay | Admitting: Physical Therapy

## 2018-03-25 DIAGNOSIS — M25552 Pain in left hip: Secondary | ICD-10-CM

## 2018-03-25 NOTE — Therapy (Signed)
[facility] [address], [place], [address] Phone: [phone]   Fax:  [phone]  Physical Therapy Treatment  Patient Details  Name: Sarah Gallagher MRN: [id number] Date of Birth: February 18, 1981 Referring Provider (PT): [place]   Encounter Date: 03/25/2018  PT End of Session - 03/25/18 1642    Visit Number  10    Number of Visits  17    Date for PT Re-Evaluation  04/03/18    PT Start Time  0415    PT Stop Time  0445    PT Time Calculation (min)  30 min    Activity Tolerance  Patient tolerated treatment well    Behavior During Therapy  [facility] for tasks assessed/performed       Past Medical History:  Diagnosis Date  . Arthritis   . Chest discomfort    a. associated with palpitations.  . Depression   . Dyspnea on exertion    a. 12/2016 Echo: EF 55-60%, no rwma, nl PASP.  Marland Kitchen Juvenile rheumatoid arthritis ([place])   . Meniere disease   . Migraines   . Palpitations   . PVC's (premature ventricular contractions)    a. 01/2017 48h Holter: Rare PAC/PVC. Freq runs of sinus tachycardia.    Past Surgical History:  Procedure Laterality Date  . KNEE SURGERY  1997, 1997   torn meniscus, ACL replacement  . REFRACTIVE SURGERY     lasix    There were no vitals filed for this visit.  Subjective Assessment - 03/25/18 1639    Subjective  Patient arrives 80mins late, session shortened accordinly. Patient reports increased soreness after wearing high heels Sunday and missin PT appt last week. Patient reports 7/10 pain today.     Pertinent History  Pt is a 37 year old female that has been seen by PCP for left hip bursitis from Oct 2018 until now. Patient reports she has had injections L hip for bursitis and unknow injections in (patient points to SIJ area). Patient reports cheif pain complaint of L hip pain, with secondary complaints of low back pain, R foot pain and cervical pain. Patient reports she saw Nicole Kindred PT for  this condition with DN to the piriformis which she reports "helped for a little while" and DN to paraspinals near L4 which she reports increased tension here. Patient reports the majority of her L hip pain is posterior at the buttock and at the L lumbar spine. Does report occassional "catch" in groin with walking. Patient reports her L buttock/LBP is tender to touch and constantly feels "tight". 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 Patient reports worst pain in the past week 7/10; best 3/10. Pt denies N/V, unexplained weight fluctuation, B&B changes, saddle paresthesia, fever, night sweats, or unrelenting night pain at this time.    Limitations  Sitting;Lifting;Standing;Walking    How long can you sit comfortably?  37min    How long can you stand comfortably?  4min    How long can you walk comfortably?  62min    Diagnostic tests  XRay 1 year ago unremarkable    Patient Stated Goals  Decrease pain, return to exercise regimen (running)    Pain Onset  More than a month ago         Manual - STM withtrigger point releaseto LQL, Lglutemed/max/deep hip ER with increased trigger points at lateral fibers Following:(3)78mm .30needles  placed alongLglute max/med, L QL with patient positioned in proneto decrease increased muscular spasms and trigger points with the patient positioned inprone.Patient was educated on risks and benefits of therapy and verbally consents to PT. - Thomas stretch with STM to L hip flexor  ESTIM+ heat packHiVolt ESTIM71min at patient tolerated 160V atL glute max/min and QL. Attemptedto decrease tension in this area. With PT assessing patient tolerance throughout (increasing intensity as needed), monitoring skin integrity (normal), with decreased pain noted from patient. Utilized time to review stretching for HEP for carry over at home                            PT Education - 03/25/18 1642    Education Details  Continuing stretching HEP     Person(s) Educated  Patient    Methods  Explanation    Comprehension  Verbalized understanding       PT Short Term Goals - 02/06/18 1250      PT SHORT TERM GOAL #1   Title  Pt will be independent with HEP in order to improve strength and balance in order to decrease fall risk and improve function at home and work.    Time  4    Period  Weeks    Status  New        PT Long Term Goals - 02/06/18 1257      PT LONG TERM GOAL #1   Title  Patient will increase FOTO score to 58 to demonstrate predicted increase in functional mobility to complete ADLs    Baseline  02/06/18 44    Time  8    Period  Weeks    Status  New      PT LONG TERM GOAL #2   Title  Pt will increase 10MWT by at least 0.13 m/s in order to demonstrate clinically significant improvement in community ambulation.     Baseline  02/06/18 1.35m/s    Time  8    Period  Weeks    Status  New      PT LONG TERM GOAL #3   Title  Pt will decrease worst pain as reported on NPRS by at least 3 points in order to demonstrate clinically significant reduction in pain.    Baseline  02/06/18 7/10    Time  8    Period  Weeks    Status  New            Plan - 03/25/18 1655    Clinical Impression Statement  Following session patient reports no pain. PT utilized pain management techniques during shortened session with education on continuing HEP for maintainence, which patient verbalized understandin of. PT will follow up as able.     Rehab Potential  Good    Clinical Impairments Affecting Rehab Potential  (+) young age, motivation, attempting active lifestyle (-) lack of social support currently, psycho social factors, chronicity of pain, multiple pain sites    PT Frequency  2x / week    PT Duration  8 weeks    PT Treatment/Interventions  ADLs/Self Care Home Management;Aquatic Therapy;Moist  Heat;Ultrasound;Traction;Cryotherapy;Electrical Stimulation;Therapeutic exercise;Therapeutic activities;Patient/family education;Functional mobility training;Neuromuscular re-education;Passive range of motion;Dry needling;Manual techniques;Taping;Spinal Manipulations    PT Next Visit Plan  core activation, ST techniques, glute/hip ER TDN    PT Home Exercise Plan  childs pose, seated piriformis stretch, heat, ball rolling    Consulted and Agree with Plan of Care  Patient  Patient will benefit from skilled therapeutic intervention in order to improve the following deficits and impairments:  Pain, Improper body mechanics, Increased fascial restricitons, Abnormal gait, Decreased mobility, Decreased activity tolerance, Decreased endurance, Postural dysfunction, Decreased strength, Difficulty walking, Impaired flexibility  Visit Diagnosis: Pain in left hip     Problem List Patient Active Problem List   Diagnosis Date Noted  . Anxiety and depression 11/27/2017  . Greater trochanteric bursitis of left hip 01/31/2017  . Low back pain 01/31/2017  . Nonallopathic lesion of lumbosacral region 01/31/2017  . Nonallopathic lesion of sacral region 01/31/2017  . Nonallopathic lesion of thoracic region 01/31/2017  . Dyspnea on exertion 12/18/2016  . Lower abdominal pain 12/18/2016  . Myalgia 12/18/2016  . Palpitations 12/18/2016  . Headache 12/18/2016  . History of occupational exposure to risk factor 12/18/2016   Shelton Silvas PT, DPT Shelton Silvas 03/25/2018, 4:57 PM  [facility] [facility] [address], [place], [address] Phone: [phone]   Fax:  [phone]  Name: TEONNA COONAN MRN: [id number] Date of Birth: 1980/12/06

## 2018-04-02 ENCOUNTER — Ambulatory Visit: Payer: 59 | Admitting: Physical Therapy

## 2018-04-02 ENCOUNTER — Encounter: Payer: Self-pay | Admitting: Physical Therapy

## 2018-04-02 DIAGNOSIS — M25552 Pain in left hip: Secondary | ICD-10-CM | POA: Diagnosis not present

## 2018-04-02 NOTE — Therapy (Signed)
[facility] [address], [place], [address] Phone: [phone]   Fax:  [phone]  Physical Therapy Treatment  Patient Details  Name: Sarah Gallagher MRN: [id number] Date of Birth: 18-Dec-1980 Referring Provider (PT): [place]   Encounter Date: 04/02/2018  PT End of Session - 04/02/18 1624    Visit Number  11    Number of Visits  17    Date for PT Re-Evaluation  04/03/18    PT Start Time  0400    PT Stop Time  0445    PT Time Calculation (min)  45 min    Activity Tolerance  Patient tolerated treatment well    Behavior During Therapy  [facility] for tasks assessed/performed       Past Medical History:  Diagnosis Date  . Arthritis   . Chest discomfort    a. associated with palpitations.  . Depression   . Dyspnea on exertion    a. 12/2016 Echo: EF 55-60%, no rwma, nl PASP.  Marland Kitchen Juvenile rheumatoid arthritis ([place])   . Meniere disease   . Migraines   . Palpitations   . PVC's (premature ventricular contractions)    a. 01/2017 48h Holter: Rare PAC/PVC. Freq runs of sinus tachycardia.    Past Surgical History:  Procedure Laterality Date  . KNEE SURGERY  1997, 1997   torn meniscus, ACL replacement  . REFRACTIVE SURGERY     lasix    There were no vitals filed for this visit.  Subjective Assessment - 04/02/18 1605    Subjective  Patient reports she has been having L sided LBP that has started "shooting down her LLE". Patient reports her pain was worse over the holidays, and correlates this to increased stress. Reports same 7/10 pain that was better after last session for a couple days. Reports no hip pain which she is happy about    Pertinent History  Pt is a 37 year old female that has been seen by PCP for left hip bursitis from Oct 2018 until now. Patient reports she has had injections L hip for bursitis and unknow injections in (patient points to SIJ area). Patient reports cheif pain complaint of L hip  pain, with secondary complaints of low back pain, R foot pain and cervical pain. Patient reports she saw Nicole Kindred PT for this condition with DN to the piriformis which she reports "helped for a little while" and DN to paraspinals near L4 which she reports increased tension here. Patient reports the majority of her L hip pain is posterior at the buttock and at the L lumbar spine. Does report occassional "catch" in groin with walking. Patient reports her L buttock/LBP is tender to touch and constantly feels "tight". 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 Patient reports worst pain in the past week 7/10; best 3/10. Pt denies N/V, unexplained weight fluctuation, B&B changes, saddle paresthesia, fever, night sweats, or unrelenting night pain at this time.    Limitations  Sitting;Lifting;Standing;Walking    How long can you sit comfortably?  53min    How long can you stand comfortably?  74min    How long can you walk comfortably?  16min    Diagnostic tests  XRay 1 year ago unremarkable    Patient Stated Goals  Decrease pain, return to exercise regimen (running)    Pain Onset  More than a month ago  Ther-Ex - Thread the needle > open book x10 each way with 3 sec hold in each position - Bird dog UE only x4, progressed alt UE/LE 3x 8 each with demo and TC initially for proper form with good carry over with cone on back for TC throughout to prevent rotation - oblique twists on theraball in crunch position with 5# 3x 10 with cuing for posture and core contraction with good carry over following    Manual - STM withtrigger point releaseto LQL, Lglutemin/max/deep hip ER, L glute med,and L hamstring with increased trigger points at lateral fibers Following:(3)31mm .30needles placed alongL QL and Lumbar paraspinal with patient positioned in proneto decrease increased muscular spasms and  trigger points with the patient positioned inprone.Patient was educated on risks and benefits of therapy and verbally consents to PT. - Grade I-II CPA L3-S1 30sec bouts 4 bouts each segment for pain modulation  ESTIM+ heat packHiVolt ESTIM32min at patient tolerated 140V atL lower lumbar paraspinals and QL. Attemptedto decrease tension in this area. With PT assessing patient tolerance throughout (increasing intensity as needed), monitoring skin integrity (normal), with decreased pain noted from patient. Utilized time for STM to [employer]                         PT Education - 04/02/18 1608    Education Details  therex technique    Person(s) Educated  Patient    Methods  Explanation;Demonstration;Verbal cues    Comprehension  Verbalized understanding;Returned demonstration;Verbal cues required       PT Short Term Goals - 02/06/18 1250      PT SHORT TERM GOAL #1   Title  Pt will be independent with HEP in order to improve strength and balance in order to decrease fall risk and improve function at home and work.    Time  4    Period  Weeks    Status  New        PT Long Term Goals - 02/06/18 1257      PT LONG TERM GOAL #1   Title  Patient will increase FOTO score to 58 to demonstrate predicted increase in functional mobility to complete ADLs    Baseline  02/06/18 44    Time  8    Period  Weeks    Status  New      PT LONG TERM GOAL #2   Title  Pt will increase 10MWT by at least 0.13 m/s in order to demonstrate clinically significant improvement in community ambulation.     Baseline  02/06/18 1.52m/s    Time  8    Period  Weeks    Status  New      PT LONG TERM GOAL #3   Title  Pt will decrease worst pain as reported on NPRS by at least 3 points in order to demonstrate clinically significant reduction in pain.    Baseline  02/06/18 7/10    Time  8    Period  Weeks    Status  New            Plan - 04/02/18 1634    Clinical Impression  Statement  Patient was responded well to manual + TDN for LBP, allowing for therex progression for core musculature which patient is able to complete with accuracy following PT cuing. Pt is reporting less hip pain and pain more localized to the LB at this time. PT will reassess progress toward goals next session.  Rehab Potential  Good    Clinical Impairments Affecting Rehab Potential  (+) young age, motivation, attempting active lifestyle (-) lack of social support currently, psycho social factors, chronicity of pain, multiple pain sites    PT Frequency  2x / week    PT Treatment/Interventions  ADLs/Self Care Home Management;Aquatic Therapy;Moist Heat;Ultrasound;Traction;Cryotherapy;Electrical Stimulation;Therapeutic exercise;Therapeutic activities;Patient/family education;Functional mobility training;Neuromuscular re-education;Passive range of motion;Dry needling;Manual techniques;Taping;Spinal Manipulations    PT Next Visit Plan  core activation, ST techniques, glute/hip ER TDN    PT Home Exercise Plan  childs pose, seated piriformis stretch, heat, ball rolling    Consulted and Agree with Plan of Care  Patient       Patient will benefit from skilled therapeutic intervention in order to improve the following deficits and impairments:  Pain, Improper body mechanics, Increased fascial restricitons, Abnormal gait, Decreased mobility, Decreased activity tolerance, Decreased endurance, Postural dysfunction, Decreased strength, Difficulty walking, Impaired flexibility  Visit Diagnosis: Pain in left hip     Problem List Patient Active Problem List   Diagnosis Date Noted  . Anxiety and depression 11/27/2017  . Greater trochanteric bursitis of left hip 01/31/2017  . Low back pain 01/31/2017  . Nonallopathic lesion of lumbosacral region 01/31/2017  . Nonallopathic lesion of sacral region 01/31/2017  . Nonallopathic lesion of thoracic region 01/31/2017  . Dyspnea on exertion 12/18/2016  .  Lower abdominal pain 12/18/2016  . Myalgia 12/18/2016  . Palpitations 12/18/2016  . Headache 12/18/2016  . History of occupational exposure to risk factor 12/18/2016   Shelton Silvas PT, DPT Shelton Silvas 04/02/2018, 4:50 PM  [facility] [address], [place], [address] Phone: [phone]   Fax:  [phone]  Name: ODEAN MCELWAIN MRN: [id number] Date of Birth: 27-Nov-1980

## 2018-04-02 NOTE — Therapy (Deleted)
[facility] [address], [place], [address] Phone: [phone]   Fax:  [phone]  April 02, 2018   @[email or web address] @  Physical Therapy Discharge Summary  Patient: Sarah Gallagher  MRN: [id number]  Date of Birth: 16-Feb-1981   Diagnosis: No diagnosis found. Referring Provider (PT): [place]   The above patient had been seen in Physical Therapy *** times of *** treatments scheduled with *** no shows and *** cancellations.  The treatment consisted of *** The patient is: {improved/worse/unchanged:3041574}  Subjective: ***  Discharge Findings: ***  Functional Status at Discharge: ***  {TKPTW:6568127}    Sincerely,   Shelton Silvas, PT   CC @[email or web address] @  [facility] [address], [place], [address] Phone: [phone]   Fax:  [phone]  Patient: Sarah Gallagher  MRN: [id number]  Date of Birth: [birth year]

## 2018-04-08 ENCOUNTER — Ambulatory Visit: Payer: Self-pay | Admitting: Family Medicine

## 2018-04-10 ENCOUNTER — Ambulatory Visit: Payer: 59 | Attending: Family Medicine | Admitting: Physical Therapy

## 2018-04-10 ENCOUNTER — Encounter: Payer: Self-pay | Admitting: Physical Therapy

## 2018-04-10 DIAGNOSIS — M25552 Pain in left hip: Secondary | ICD-10-CM | POA: Diagnosis not present
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
## 2018-04-11 ENCOUNTER — Encounter: Payer: Self-pay | Admitting: Physical Therapy

## 2018-04-16 ENCOUNTER — Encounter: Payer: Self-pay | Admitting: Physical Therapy

## 2018-04-16 ENCOUNTER — Ambulatory Visit: Payer: 59 | Admitting: Physical Therapy

## 2018-04-16 DIAGNOSIS — M25552 Pain in left hip: Secondary | ICD-10-CM

## 2018-04-16 NOTE — Therapy (Signed)
[facility] [address], [place], [address] Phone: [phone]   Fax:  [phone]  Physical Therapy Treatment  Patient Details  Name: Sarah Gallagher MRN: [id number] Date of Birth: 1980/05/13 Referring Provider (PT): [place]   Encounter Date: 04/16/2018  PT End of Session - 04/16/18 1649    Visit Number  13    Number of Visits  29    Date for PT Re-Evaluation  05/22/18    PT Start Time  0415    PT Stop Time  0500    PT Time Calculation (min)  45 min    Activity Tolerance  Patient tolerated treatment well    Behavior During Therapy  [facility] for tasks assessed/performed       Past Medical History:  Diagnosis Date  . Arthritis   . Chest discomfort    a. associated with palpitations.  . Depression   . Dyspnea on exertion    a. 12/2016 Echo: EF 55-60%, no rwma, nl PASP.  Marland Kitchen Juvenile rheumatoid arthritis ([place])   . Meniere disease   . Migraines   . Palpitations   . PVC's (premature ventricular contractions)    a. 01/2017 48h Holter: Rare PAC/PVC. Freq runs of sinus tachycardia.    Past Surgical History:  Procedure Laterality Date  . KNEE SURGERY  1997, 1997   torn meniscus, ACL replacement  . REFRACTIVE SURGERY     lasix    There were no vitals filed for this visit.  Subjective Assessment - 04/16/18 1626    Subjective  Patient reports 4/10 pain in the hip today that she reports is posterior hip/LB radiating into mid back. Patient reports ant hip tension as well today. Reports compliance with HEP with no quetions or concerns.     Pertinent History  Pt is a 38 year old female that has been seen by PCP for left hip bursitis from Oct 2018 until now. Patient reports she has had injections L hip for bursitis and unknow injections in (patient points to SIJ area). Patient reports cheif pain complaint of L hip pain, with secondary complaints of low back pain, R foot pain and cervical pain. Patient reports she  saw Nicole Kindred PT for this condition with DN to the piriformis which she reports "helped for a little while" and DN to paraspinals near L4 which she reports increased tension here. Patient reports the majority of her L hip pain is posterior at the buttock and at the L lumbar spine. Does report occassional "catch" in groin with walking. Patient reports her L buttock/LBP is tender to touch and constantly feels "tight". 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 Patient reports worst pain in the past week 7/10; best 3/10. Pt denies N/V, unexplained weight fluctuation, B&B changes, saddle paresthesia, fever, night sweats, or unrelenting night pain at this time.    Limitations  Sitting;Lifting;Standing;Walking    How long can you sit comfortably?  59min    How long can you stand comfortably?  31min    How long can you walk comfortably?  40min    Diagnostic tests  XRay 1 year ago unremarkable    Patient Stated Goals  Decrease pain, return to exercise regimen (running)    Pain Onset  More than a month ago       Ther-Ex - Childs pose 30sec hold - anti rotation 10# 3x 10 with  demo and cuing initially for proper posture and eccentric control - TA pushdown 20# 3x 10 with cuing initially for set up with proper posture and core contraction - Plank 2x 30sec with max cuing initially for proper form with core contraction/stabilization with good carry over following  Manual - STM withtrigger point releaseto LQL, Lglutemin/max/deep hip ER, and bilat periscapular muscles/tspine paraspinals with increased trigger points at lateral fibers Following:(3)66mm .30needles placed alongL QL and Lumbar paraspinal with patient positioned in proneto decrease increased muscular spasms and trigger points with the patient positioned inprone.Patient was educated on risks and benefits of therapy and verbally  consents to PT. - Grade I-II CPA L3-S1 30sec bouts 4 bouts each segment for pain modulation                          PT Education - 04/16/18 1648    Education Details  Exercise form; DN education    Person(s) Educated  Patient    Methods  Explanation;Demonstration;Verbal cues    Comprehension  Returned demonstration;Verbal cues required;Verbalized understanding       PT Short Term Goals - 02/06/18 1250      PT SHORT TERM GOAL #1   Title  Pt will be independent with HEP in order to improve strength and balance in order to decrease fall risk and improve function at home and work.    Time  4    Period  Weeks    Status  New        PT Long Term Goals - 04/11/18 1624      PT LONG TERM GOAL #1   Title  Patient will increase FOTO score to 58 to demonstrate predicted increase in functional mobility to complete ADLs    Baseline  04/11/18 45    Time  8    Period  Weeks    Status  On-going      PT LONG TERM GOAL #2   Title  Pt will increase 10MWT by at least 0.13 m/s in order to demonstrate clinically significant improvement in community ambulation.     Baseline  02/06/18 1.41m/s    Time  8    Period  Weeks    Status  Deferred      PT LONG TERM GOAL #3   Title  Pt will decrease worst pain as reported on NPRS by at least 3 points in order to demonstrate clinically significant reduction in pain.    Baseline  02/06/18 7/10; 04/11/18 6/10    Time  8    Period  Weeks    Status  On-going            Plan - 04/16/18 1651    Clinical Impression Statement  PT added thoracic spine and periscapular musculature tension relief, which patient reports good pain relief with. PT increased challenge of core therex (as discussed lsat visit), which patient is able to comoplete with accuracy following PT cuing.     Rehab Potential  Good    Clinical Impairments Affecting Rehab Potential  (+) young age, motivation, attempting active lifestyle (-) lack of social support  currently, psycho social factors, chronicity of pain, multiple pain sites    PT Frequency  2x / week    PT Treatment/Interventions  ADLs/Self Care Home Management;Aquatic Therapy;Moist Heat;Ultrasound;Traction;Cryotherapy;Electrical Stimulation;Therapeutic exercise;Therapeutic activities;Patient/family education;Functional mobility training;Neuromuscular re-education;Passive range of motion;Dry needling;Manual techniques;Taping;Spinal Manipulations    PT Next Visit Plan  core activation, ST techniques, glute/hip ER TDN  PT Home Exercise Plan  childs pose, seated piriformis stretch, heat, ball rolling    Consulted and Agree with Plan of Care  Patient       Patient will benefit from skilled therapeutic intervention in order to improve the following deficits and impairments:  Pain, Improper body mechanics, Increased fascial restricitons, Abnormal gait, Decreased mobility, Decreased activity tolerance, Decreased endurance, Postural dysfunction, Decreased strength, Difficulty walking, Impaired flexibility  Visit Diagnosis: Pain in left hip     Problem List Patient Active Problem List   Diagnosis Date Noted  . Anxiety and depression 11/27/2017  . Greater trochanteric bursitis of left hip 01/31/2017  . Low back pain 01/31/2017  . Nonallopathic lesion of lumbosacral region 01/31/2017  . Nonallopathic lesion of sacral region 01/31/2017  . Nonallopathic lesion of thoracic region 01/31/2017  . Dyspnea on exertion 12/18/2016  . Lower abdominal pain 12/18/2016  . Myalgia 12/18/2016  . Palpitations 12/18/2016  . Headache 12/18/2016  . History of occupational exposure to risk factor 12/18/2016   Shelton Silvas PT, DPT  Shelton Silvas 04/16/2018, 5:03 PM  [facility] [address], [place], [address] Phone: [phone]   Fax:  [phone]  Name: Sarah Gallagher MRN: [id number] Date of Birth: 1981/01/20

## 2018-04-23 NOTE — Progress Notes (Signed)
[facility] [place], [place] [address] Phone: [phone] Subjective:   Sarah Gallagher, am serving as a scribe for Dr. Hulan Saas.    CC: Left hip pain  JJH:[id number]  Sarah Gallagher is a 38 y.o. female coming in with complaint of left hip pain. Patient has been going to physical therapy for bursitis. Feels that she should be a lot further along. Pain is constant but is exacerbated by hip ER, abduction and stairs. Pain is sharp like a cramp patient states. Uses pennsaid and tylenol regularly. Was on gabapentin but her prescription ran out. When lying supine the pain radiates into her foot.    Patient has had hip and back pain x-rays previously in November 2018.  These were independently visualized by me showing Gallagher significant arthritic changes  Past Medical History:  Diagnosis Date  . Arthritis   . Chest discomfort    a. associated with palpitations.  . Depression   . Dyspnea on exertion    a. 12/2016 Echo: EF 55-60%, Gallagher rwma, nl PASP.  Marland Kitchen Juvenile rheumatoid arthritis ([place])   . Meniere disease   . Migraines   . Palpitations   . PVC's (premature ventricular contractions)    a. 01/2017 48h Holter: Rare PAC/PVC. Freq runs of sinus tachycardia.   Past Surgical History:  Procedure Laterality Date  . KNEE SURGERY  1997, 1997   torn meniscus, ACL replacement  . REFRACTIVE SURGERY     lasix   Social History   Socioeconomic History  . Marital status: Married    Spouse name: Not on file  . Number of children: 3  . Years of education: BS  . Highest education level: Not on file  Occupational History    Comment: [employer], Resp Therapist  Social Needs  . Financial resource strain: Not on file  . Food insecurity:    Worry: Not on file    Inability: Not on file  . Transportation needs:    Medical: Not on file    Non-medical: Not on file  Tobacco Use  . Smoking status: Never Smoker  . Smokeless tobacco: Never Used    Substance and Sexual Activity  . Alcohol use: Yes    Alcohol/week: 0.0 standard drinks    Comment: rare  . Drug use: Gallagher  . Sexual activity: Yes    Birth control/protection: Injection  Lifestyle  . Physical activity:    Days per week: Not on file    Minutes per session: Not on file  . Stress: Not on file  Relationships  . Social connections:    Talks on phone: Not on file    Gets together: Not on file    Attends religious service: Not on file    Active member of club or organization: Not on file    Attends meetings of clubs or organizations: Not on file    Relationship status: Not on file  Other Topics Concern  . Not on file  Social History Narrative   Lives in [place] with husband and children.  Not currently exercising.  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   Caffeine 2-3 cups daily   Allergies  Allergen Reactions  . Zithromax [Azithromycin] Other (See Comments)    "convulsions or tremors"   Family History  Problem Relation Age of Onset  . Diabetes Mother   . Arthritis Father   . Other Father        atrial flutter  .  Emphysema Father        smoker  . Diabetes Maternal Aunt   . Diabetes Maternal Uncle   . Stroke Paternal Aunt   . Arthritis Maternal Grandmother   . Diabetes Maternal Grandmother   . Cancer Maternal Grandmother        skin  . Arthritis Paternal Grandmother   . Cancer Paternal Grandmother        skin    Current Outpatient Medications (Endocrine & Metabolic):  .  medroxyPROGESTERone (DEPO-PROVERA) 150 MG/ML injection, Inject 1 mL (150 mg total) into the muscle every 3 (three) months.   Current Outpatient Medications (Respiratory):  .  albuterol (PROVENTIL HFA;VENTOLIN HFA) 108 (90 Base) MCG/ACT inhaler, Inhale 1-2 puffs into the lungs every 6 (six) hours as needed for wheezing or shortness of breath. .  fluticasone (FLONASE) 50 MCG/ACT nasal spray, Place into the nose.  Current Outpatient Medications (Analgesics):  .  aspirin-acetaminophen-caffeine (EXCEDRIN  MIGRAINE) 250-250-65 MG tablet, Take by mouth daily. .  SUMAtriptan (IMITREX) 50 MG tablet, 50 mg as needed.   Current Outpatient Medications (Other):  .  baclofen (LIORESAL) 10 MG tablet, Take 1 tablet (10 mg total) by mouth 3 (three) times daily. .  pantoprazole (PROTONIX) 40 MG tablet, Take 40 mg by mouth daily. Marland Kitchen  gabapentin (NEURONTIN) 100 MG capsule, Take 2 capsules (200 mg total) by mouth at bedtime. .  Vitamin D, Ergocalciferol, (DRISDOL) 1.25 MG (50000 UT) CAPS capsule, Take 1 capsule (50,000 Units total) by mouth every 7 (seven) days.    Past medical history, social, surgical and family history all reviewed in electronic medical record.  Gallagher pertanent information unless stated regarding to the chief complaint.   Review of Systems:  Gallagher headache, visual changes, nausea, vomiting, diarrhea, constipation, dizziness, abdominal pain, skin rash, fevers, chills, night sweats, weight loss, swollen lymph nodes, body aches, joint swelling,chest pain, shortness of breath, mood changes.  Positive muscle aches  Objective  Blood pressure 104/78, pulse 87, height 5\' 6"  (1.676 m), weight 171 lb (77.6 kg), SpO2 97 %.    General: Gallagher apparent distress alert and oriented x3 mood and affect normal, dressed appropriately.  HEENT: Pupils equal, extraocular movements intact  Respiratory: Patient's speak in full sentences and does not appear short of breath  Cardiovascular: Gallagher lower extremity edema, non tender, Gallagher erythema  Skin: Warm dry intact with Gallagher signs of infection or rash on extremities or on axial skeleton.  Abdomen: Soft nontender  Neuro: Cranial nerves II through XII are intact, neurovascularly intact in all extremities with 2+ DTRs and 2+ pulses.  Lymph: Gallagher lymphadenopathy of posterior or anterior cervical chain or axillae bilaterally.  Gait normal with good balance and coordination.  MSK:  Non tender with full range of motion and good stability and symmetric strength and tone of shoulders,  elbows, wrist, , knee and ankles bilaterally.  Back exam is fairly unremarkable.  Patient has severe tenderness on deep palpation over the left gluteal insertion near the lateral aspect of the hip.  Approximately 1 cm posterior.  Gallagher masses palpated but does have possible overlying lipoma. Negative straight leg test.  Mild positive Corky Sox test  Procedure: Real-time Ultrasound Guided Injection of left gluteal tendon sheath Device: GE Logiq Q7 Ultrasound guided injection is preferred based studies that show increased duration, increased effect, greater accuracy, decreased procedural pain, increased response rate, and decreased cost with ultrasound guided versus blind injection.  Verbal informed consent obtained.  Time-out conducted.  Noted Gallagher overlying erythema, induration, or  other signs of local infection.  Skin prepped in a sterile fashion.  Local anesthesia: Topical Ethyl chloride.  With sterile technique and under real time ultrasound guidance: With a 21-gauge 2 inch needle patient was injected with 1 cc of 0.5% Marcaine and 1 cc of Kenalog 40 mg/mL into the gluteal tendon sheath Completed without difficulty  Pain immediately resolved suggesting accurate placement of the medication.  Advised to call if fevers/chills, erythema, induration, drainage, or persistent bleeding.  Images permanently stored and available for review in the ultrasound unit.  Impression: Technically successful ultrasound guided injection.   Impression and Recommendations:     This case required medical decision making of moderate complexity. The above documentation has been reviewed and is accurate and complete Lyndal Pulley, DO       Note: This dictation was prepared with Dragon dictation along with smaller phrase technology. Any transcriptional errors that result from this process are unintentional.

## 2018-04-24 ENCOUNTER — Ambulatory Visit (INDEPENDENT_AMBULATORY_CARE_PROVIDER_SITE_OTHER): Payer: 59 | Admitting: Family Medicine

## 2018-04-24 ENCOUNTER — Ambulatory Visit: Payer: Self-pay

## 2018-04-24 VITALS — BP 104/78 | HR 87 | Ht 66.0 in | Wt 171.0 lb

## 2018-04-24 DIAGNOSIS — M7602 Gluteal tendinitis, left hip: Secondary | ICD-10-CM | POA: Insufficient documentation

## 2018-04-24 DIAGNOSIS — M25552 Pain in left hip: Secondary | ICD-10-CM

## 2018-04-24 MED ORDER — GABAPENTIN 100 MG PO CAPS: 200.0000 mg | ORAL_CAPSULE | Freq: Every day | ORAL | 3 refills | Status: DC

## 2018-04-24 MED ORDER — VITAMIN D (ERGOCALCIFEROL) 1.25 MG (50000 UNIT) PO CAPS: 50000.0000 [IU] | ORAL_CAPSULE | ORAL | 0 refills | Status: DC

## 2018-04-24 NOTE — Assessment & Plan Note (Signed)
Patient given injection today.  Tolerated procedure well.  Encouraged her to continue the physical therapy that discussed more for the gluteal tendon than truly the greater trochanteric bursitis.  Differential includes lumbar radiculopathy but x-rays previously had been fairly unremarkable.  Continuing to have worsening pain I would consider the possibility of advanced imaging with the low duration of this going on nearly 2 years.  Patient is in agreement with the plan.  Follow-up with me again in 4 to 8 weeks

## 2018-04-24 NOTE — Patient Instructions (Signed)
Good to see you  Ice is your friend Take 3 days from a lot of lifting.  Stay active after  See me again in 3-4 weeks

## 2018-04-25 ENCOUNTER — Ambulatory Visit: Payer: 59

## 2018-04-25 ENCOUNTER — Encounter: Payer: Self-pay | Admitting: Physical Therapy

## 2018-04-25 DIAGNOSIS — M25552 Pain in left hip: Secondary | ICD-10-CM

## 2018-04-25 NOTE — Therapy (Signed)
[facility] [address] [place], [address] Phone: [phone]   Fax:  [phone]  Physical Therapy Treatment  Patient Details  Name: Sarah Gallagher MRN: [id number] Date of Birth: 1981/01/03 Referring Provider (PT): [place]   Encounter Date: 04/25/2018  PT End of Session - 04/25/18 1651    Visit Number  14    Number of Visits  29    Date for PT Re-Evaluation  05/22/18    PT Start Time  3151    PT Stop Time  7616    PT Time Calculation (min)  33 min    Activity Tolerance  Patient tolerated treatment well    Behavior During Therapy  [facility] for tasks assessed/performed       Past Medical History:  Diagnosis Date  . Arthritis   . Chest discomfort    a. associated with palpitations.  . Depression   . Dyspnea on exertion    a. 12/2016 Echo: EF 55-60%, no rwma, nl PASP.  Marland Kitchen Juvenile rheumatoid arthritis ([place])   . Meniere disease   . Migraines   . Palpitations   . PVC's (premature ventricular contractions)    a. 01/2017 48h Holter: Rare PAC/PVC. Freq runs of sinus tachycardia.    Past Surgical History:  Procedure Laterality Date  . KNEE SURGERY  1997, 1997   torn meniscus, ACL replacement  . REFRACTIVE SURGERY     lasix    There were no vitals filed for this visit.  Subjective Assessment - 04/25/18 1610    Subjective  Patient reported that she went to her physician, had a steroid injection for calcifiation in a tendon around her L hip joint, as well as a lyphoma. Physician instructed pt to take it easy for a couple day, maybe to have PT focus on neck for this session.      Pertinent History  Pt is a 38 year old female that has been seen by PCP for left hip bursitis from Oct 2018 until now. Patient reports she has had injections L hip for bursitis and unknow injections in (patient points to SIJ area). Patient reports cheif pain complaint of L hip pain, with secondary complaints of low back pain, R  foot pain and cervical pain. Patient reports she saw Nicole Kindred PT for this condition with DN to the piriformis which she reports "helped for a little while" and DN to paraspinals near L4 which she reports increased tension here. Patient reports the majority of her L hip pain is posterior at the buttock and at the L lumbar spine. Does report occassional "catch" in groin with walking. Patient reports her L buttock/LBP is tender to touch and constantly feels "tight". 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 Patient reports worst pain in the past week 7/10; best 3/10. Pt denies N/V, unexplained weight fluctuation, B&B changes, saddle paresthesia, fever, night sweats, or unrelenting night pain at this time.    Limitations  Sitting;Lifting;Standing;Walking    How long can you sit comfortably?  53min    How long can you stand comfortably?  63min    How long can you walk comfortably?  20min    Diagnostic tests  XRay 1 year ago unremarkable    Patient Stated Goals  Decrease pain, return to exercise regimen (running)    Currently in Pain?  Yes    Pain Score  6  Pain Location  Neck    Pain Orientation  Right    Pain Descriptors / Indicators  Aching;Tender;Tightness      TREATMENT: Session shortened due to pt arriving late.   Ther-Ex Quadruped thoracic rotations x5 ea side performed with verbal/tactile cues Thoracic rotations (open book/close book) x10 ea side with verbal cues   Manual:  STM and trigger point release to rhomboids, middle trap, UT, levator scap, R>L Grade II-III 2 bouts x20secs ea level, L4-T2     PT Education - 04/25/18 1649    Education Details  exercise form    Methods  Explanation;Demonstration;Verbal cues    Comprehension  Verbalized understanding;Verbal cues required       PT Short Term Goals - 02/06/18 1250      PT SHORT TERM GOAL #1   Title  Pt will be  independent with HEP in order to improve strength and balance in order to decrease fall risk and improve function at home and work.    Time  4    Period  Weeks    Status  New        PT Long Term Goals - 04/11/18 1624      PT LONG TERM GOAL #1   Title  Patient will increase FOTO score to 58 to demonstrate predicted increase in functional mobility to complete ADLs    Baseline  04/11/18 45    Time  8    Period  Weeks    Status  On-going      PT LONG TERM GOAL #2   Title  Pt will increase 10MWT by at least 0.13 m/s in order to demonstrate clinically significant improvement in community ambulation.     Baseline  02/06/18 1.57m/s    Time  8    Period  Weeks    Status  Deferred      PT LONG TERM GOAL #3   Title  Pt will decrease worst pain as reported on NPRS by at least 3 points in order to demonstrate clinically significant reduction in pain.    Baseline  02/06/18 7/10; 04/11/18 6/10    Time  8    Period  Weeks    Status  On-going            Plan - 04/25/18 1649    Clinical Impression Statement  Patient reported minimal pain relief at end of session for L periscapular/neck region with manual techniques, may benefit from trial TDN to assess response. Session focused on ROM and tissue extensibility, pt with no complaints at end of session.     Rehab Potential  Good    Clinical Impairments Affecting Rehab Potential  (+) young age, motivation, attempting active lifestyle (-) lack of social support currently, psycho social factors, chronicity of pain, multiple pain sites    PT Frequency  2x / week    PT Duration  8 weeks    PT Treatment/Interventions  ADLs/Self Care Home Management;Aquatic Therapy;Moist Heat;Ultrasound;Traction;Cryotherapy;Electrical Stimulation;Therapeutic exercise;Therapeutic activities;Patient/family education;Functional mobility training;Neuromuscular re-education;Passive range of motion;Dry needling;Manual techniques;Taping;Spinal Manipulations    PT Next Visit Plan   core activation, ST techniques, glute/hip ER TDN    PT Home Exercise Plan  childs pose, seated piriformis stretch, heat, ball rolling    Consulted and Agree with Plan of Care  Patient       Patient will benefit from skilled therapeutic intervention in order to improve the following deficits and impairments:  Pain, Improper body mechanics, Increased fascial restricitons, Abnormal gait, Decreased mobility,  Decreased activity tolerance, Decreased endurance, Postural dysfunction, Decreased strength, Difficulty walking, Impaired flexibility  Visit Diagnosis: Pain in left hip     Problem List Patient Active Problem List   Diagnosis Date Noted  . Gluteal tendinitis of left buttock 04/24/2018  . Anxiety and depression 11/27/2017  . Greater trochanteric bursitis of left hip 01/31/2017  . Low back pain 01/31/2017  . Nonallopathic lesion of lumbosacral region 01/31/2017  . Nonallopathic lesion of sacral region 01/31/2017  . Nonallopathic lesion of thoracic region 01/31/2017  . Dyspnea on exertion 12/18/2016  . Lower abdominal pain 12/18/2016  . Myalgia 12/18/2016  . Palpitations 12/18/2016  . Headache 12/18/2016  . History of occupational exposure to risk factor 12/18/2016    Lieutenant Diego PT, DPT 4:54 PM,04/25/18 [facility] [address], [place], [address] Phone: [phone]   Fax:  [phone]  Name: JUSTYNA TIMONEY MRN: [id number] Date of Birth: May 02, 1980

## 2018-04-30 ENCOUNTER — Ambulatory Visit: Payer: 59 | Admitting: Physical Therapy

## 2018-05-07 ENCOUNTER — Encounter: Payer: 59 | Admitting: Physical Therapy

## 2018-05-14 ENCOUNTER — Encounter: Payer: Self-pay | Admitting: Physical Therapy

## 2018-05-15 ENCOUNTER — Ambulatory Visit: Payer: 59 | Admitting: Physical Therapy

## 2018-05-15 DIAGNOSIS — L700 Acne vulgaris: Secondary | ICD-10-CM | POA: Diagnosis not present

## 2018-05-19 NOTE — Progress Notes (Signed)
[facility] [address], [place] [address] Phone: [phone] Subjective:   [name], am serving as a scribe for Dr. Hulan Saas.  I'm seeing this patient by the request  of:    CC: Left hip pain follow-up  HDQ:[id number]   04/24/2018: Patient given injection today.  Tolerated procedure well.  Encouraged her to continue the physical therapy that discussed more for the gluteal tendon than truly the greater trochanteric bursitis.  Differential includes lumbar radiculopathy but x-rays previously had been fairly unremarkable.  Continuing to have worsening pain I would consider the possibility of advanced imaging with the low duration of this going on nearly 2 years.  Patient is in agreement with the plan.  Follow-up with me again in 4 to 8 weeks Update 05/20/2018: Sarah Gallagher is a 38 y.o. female coming in with complaint of left hip pain. Patient states that the injection did help alleviate her pain. Has not been doing physical therapy for a few weeks so she is feeling tight. Does have shooting pain down left side intermittently when standing.   Lesions previous x-rays of the back were from November 2018 with no significant abnormality.  Past Medical History:  Diagnosis Date  . Arthritis   . Chest discomfort    a. associated with palpitations.  . Depression   . Dyspnea on exertion    a. 12/2016 Echo: EF 55-60%, no rwma, nl PASP.  Marland Kitchen Juvenile rheumatoid arthritis ([place])   . Meniere disease   . Migraines   . Palpitations   . PVC's (premature ventricular contractions)    a. 01/2017 48h Holter: Rare PAC/PVC. Freq runs of sinus tachycardia.   Past Surgical History:  Procedure Laterality Date  . KNEE SURGERY  1997, 1997   torn meniscus, ACL replacement  . REFRACTIVE SURGERY     lasix   Social History   Socioeconomic History  . Marital status: Married    Spouse name: Not on file  . Number of children: 3  . Years of education: BS  .  Highest education level: Not on file  Occupational History    Comment: [employer], Resp Therapist  Social Needs  . Financial resource strain: Not on file  . Food insecurity:    Worry: Not on file    Inability: Not on file  . Transportation needs:    Medical: Not on file    Non-medical: Not on file  Tobacco Use  . Smoking status: Never Smoker  . Smokeless tobacco: Never Used  Substance and Sexual Activity  . Alcohol use: Yes    Alcohol/week: 0.0 standard drinks    Comment: rare  . Drug use: No  . Sexual activity: Yes    Birth control/protection: Injection  Lifestyle  . Physical activity:    Days per week: Not on file    Minutes per session: Not on file  . Stress: Not on file  Relationships  . Social connections:    Talks on phone: Not on file    Gets together: Not on file    Attends religious service: Not on file    Active member of club or organization: Not on file    Attends meetings of clubs or organizations: Not on file    Relationship status: Not on file  Other Topics Concern  . Not on file  Social History Narrative   Lives in [place] with husband and children.  Not currently exercising.  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   Caffeine 2-3 cups daily   Allergies  Allergen Reactions  . Zithromax [Azithromycin] Other (See Comments)    "convulsions or tremors"   Family History  Problem Relation Age of Onset  . Diabetes Mother   . Arthritis Father   . Other Father        atrial flutter  . Emphysema Father        smoker  . Diabetes Maternal Aunt   . Diabetes Maternal Uncle   . Stroke Paternal Aunt   . Arthritis Maternal Grandmother   . Diabetes Maternal Grandmother   . Cancer Maternal Grandmother        skin  . Arthritis Paternal Grandmother   . Cancer Paternal Grandmother        skin    Current Outpatient Medications (Endocrine & Metabolic):  .  medroxyPROGESTERone (DEPO-PROVERA) 150 MG/ML injection, Inject 1 mL (150 mg total) into the muscle every 3  (three) months.   Current Outpatient Medications (Respiratory):  .  albuterol (PROVENTIL HFA;VENTOLIN HFA) 108 (90 Base) MCG/ACT inhaler, Inhale 1-2 puffs into the lungs every 6 (six) hours as needed for wheezing or shortness of breath. .  fluticasone (FLONASE) 50 MCG/ACT nasal spray, Place into the nose.  Current Outpatient Medications (Analgesics):  .  aspirin-acetaminophen-caffeine (EXCEDRIN MIGRAINE) 250-250-65 MG tablet, Take by mouth daily. .  SUMAtriptan (IMITREX) 50 MG tablet, 50 mg as needed.   Current Outpatient Medications (Other):  .  baclofen (LIORESAL) 10 MG tablet, Take 1 tablet (10 mg total) by mouth 3 (three) times daily. Marland Kitchen  doxycycline (ORACEA) 40 MG capsule, Take 40 mg by mouth every morning. .  gabapentin (NEURONTIN) 100 MG capsule, Take 2 capsules (200 mg total) by mouth at bedtime. .  pantoprazole (PROTONIX) 40 MG tablet, Take 40 mg by mouth daily. .  Vitamin D, Ergocalciferol, (DRISDOL) 1.25 MG (50000 UT) CAPS capsule, Take 1 capsule (50,000 Units total) by mouth every 7 (seven) days.    Past medical history, social, surgical and family history all reviewed in electronic medical record.  No pertanent information unless stated regarding to the chief complaint.   Review of Systems:  No headache, visual changes, nausea, vomiting, diarrhea, constipation, dizziness, abdominal pain, skin rash, fevers, chills, night sweats, weight loss, swollen lymph nodes, body aches, joint swelling, muscle aches, chest pain, shortness of breath, mood changes.   Objective  Blood pressure 102/64, pulse 77, height 5\' 6"  (1.676 m), weight 172 lb (78 kg), SpO2 97 %.   General: No apparent distress alert and oriented x3 mood and affect normal, dressed appropriately.  HEENT: Pupils equal, extraocular movements intact  Respiratory: Patient's speak in full sentences and does not appear short of breath  Cardiovascular: No lower extremity edema, non tender, no erythema  Skin: Warm dry intact  with no signs of infection or rash on extremities or on axial skeleton.  Abdomen: Soft nontender  Neuro: Cranial nerves II through XII are intact, neurovascularly intact in all extremities with 2+ DTRs and 2+ pulses.  Lymph: No lymphadenopathy of posterior or anterior cervical chain or axillae bilaterally.  Gait normal with good balance and coordination.  MSK:  Non tender with full range of motion and good stability and symmetric strength and tone of shoulders, elbows, wrist,  knee and ankles bilaterally.  Mild tightness in the upper back noted.  Multiple trigger points noted Hip: Left ROM IR: 25 Deg, ER: 45 Deg, Flexion: 120 Deg, Extension: 100 Deg, Abduction: 45 Deg, Adduction: 25 Deg Strength IR:  5/5, ER: 5/5, Flexion: 5/5, Extension: 5/5, Abduction: 5/5, Adduction: 5/5 Pelvic alignment unremarkable to inspection and palpation. Standing hip rotation and gait without trendelenburg sign / unsteadiness. Greater trochanter without tenderness to palpation. No tenderness over piriformis and greater trochanter. Tender to palpation in the paraspinal musculature laterally.  More around the left sacroiliac joint mild over the left gluteal    Impression and Recommendations:      The above documentation has been reviewed and is accurate and complete Lyndal Pulley, DO       Note: This dictation was prepared with Dragon dictation along with smaller phrase technology. Any transcriptional errors that result from this process are unintentional.

## 2018-05-20 ENCOUNTER — Encounter: Payer: Self-pay | Admitting: Family Medicine

## 2018-05-20 ENCOUNTER — Encounter: Payer: 59 | Admitting: Physical Therapy

## 2018-05-20 ENCOUNTER — Ambulatory Visit: Payer: 59 | Admitting: Family Medicine

## 2018-05-20 VITALS — BP 102/64 | HR 77 | Ht 66.0 in | Wt 172.0 lb

## 2018-05-20 DIAGNOSIS — M7602 Gluteal tendinitis, left hip: Secondary | ICD-10-CM | POA: Diagnosis not present

## 2018-05-20 DIAGNOSIS — M546 Pain in thoracic spine: Secondary | ICD-10-CM

## 2018-05-20 NOTE — Assessment & Plan Note (Signed)
Responded well to the injection.  Discussed in core strengthening, hip abductor strengthening that I think will be beneficial as well.  Patient is to make these improvements and continue with dry needling with physical therapy. A dry needling for more the upper back as well.  Follow-up with me again in 4 to 6 weeks.

## 2018-05-20 NOTE — Patient Instructions (Addendum)
Good to see you  Ice is your friend Stay active Read about PRP but I think you will be fine without it.  Added on PT for upper back  See me again in 4-8 weeks

## 2018-05-22 ENCOUNTER — Encounter: Payer: Self-pay | Admitting: Physical Therapy

## 2018-05-22 ENCOUNTER — Ambulatory Visit: Payer: 59 | Attending: Family Medicine | Admitting: Physical Therapy

## 2018-05-22 DIAGNOSIS — M546 Pain in thoracic spine: Secondary | ICD-10-CM | POA: Diagnosis not present

## 2018-05-22 DIAGNOSIS — M25552 Pain in left hip: Secondary | ICD-10-CM | POA: Diagnosis not present

## 2018-05-22 NOTE — Therapy (Signed)
[facility] [address] [place], [address] Phone: [phone]   Fax:  [phone]  Physical Therapy Treatment  Patient Details  Name: Sarah Gallagher MRN: [id number] Date of Birth: [birth year] Referring Provider (PT): [place]   Encounter Date: 05/22/2018  PT End of Session - 05/22/18 7322    Visit Number  15    Number of Visits  45    Date for PT Re-Evaluation  07/17/18    PT Start Time  0400    PT Stop Time  0448    PT Time Calculation (min)  48 min    Activity Tolerance  Patient tolerated treatment well    Behavior During Therapy  [facility] for tasks assessed/performed       Past Medical History:  Diagnosis Date  . Arthritis   . Chest discomfort    a. associated with palpitations.  . Depression   . Dyspnea on exertion    a. 12/2016 Echo: EF 55-60%, no rwma, nl PASP.  Marland Kitchen Juvenile rheumatoid arthritis ([place])   . Meniere disease   . Migraines   . Palpitations   . PVC's (premature ventricular contractions)    a. 01/2017 48h Holter: Rare PAC/PVC. Freq runs of sinus tachycardia.    Past Surgical History:  Procedure Laterality Date  . KNEE SURGERY  1997, 1997   torn meniscus, ACL replacement  . REFRACTIVE SURGERY     lasix    There were no vitals filed for this visit.  Subjective Assessment - 05/22/18 1607    Subjective  Patient reports she saw Dr. Tamala Julian Monday who expanded her order to upper back where she is having some increased muscle tension/spasm. 04/24/18 reports she had an injection in the hip that she thinks helped. Chief complaint today of thoracic and superior shoulder pain after wearing lead vest today. Patient reports upper back pain 5/10 and hip 4/10.     Pertinent History  Pt is a 38 year old female that has been seen by PCP for left hip bursitis from Oct 2018 until now. Patient reports she has had injections L hip for bursitis and unknow injections in (patient points to SIJ area).  Patient reports cheif pain complaint of L hip pain, with secondary complaints of low back pain, R foot pain and cervical pain. Patient reports she saw Nicole Kindred PT for this condition with DN to the piriformis which she reports "helped for a little while" and DN to paraspinals near L4 which she reports increased tension here. Patient reports the majority of her L hip pain is posterior at the buttock and at the L lumbar spine. Does report occassional "catch" in groin with walking. Patient reports her L buttock/LBP is tender to touch and constantly feels "tight". 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 Patient reports worst pain in the past week 7/10; best 3/10. Pt denies N/V, unexplained weight fluctuation, B&B changes, saddle paresthesia, fever, night sweats, or unrelenting night pain at this time.    Limitations  Sitting;Lifting;Standing;Walking    How long can you sit comfortably?  33min    How long can you stand comfortably?  70min    How long can you walk comfortably?  66min    Diagnostic tests  XRay 1 year ago unremarkable    Patient Stated Goals  Decrease pain, return to exercise regimen (running)    Pain Onset  More  than a month ago       Manual STM with trigger point release to bilat UT, levator, upper tspine paraspinals, and L lumbar paraspinals/QL, and L glute med/max/min L3-S1 grade II CPA 30sec bouts 4 bouts each segment for pain management; increased to grade III for increased motion  T3-7 grade II CPA 30sec bouts 4 bouts each segment for pain management; increased to grade III/IV for increased motion with incidental cavitation Following:     ESTIM + heat pack [employer] ESTIM 15 min at patient tolerated 140V increased to 150V through treatment at bilat UT/levator area . Attempted to decrease tension in this area. With PT assessing patient tolerance throughout (increasing intensity  as needed), monitoring skin integrity (normal), with decreased pain noted from patient. Discussed hip and core strengthening POC update and went over new goals with patient during this time. Gave patient sidelying hip abd with tband for HEP to initiate hip abd activation    10MWT unbilled                   PT Education - 05/22/18 1719    Education Details  POC update, HEP update    Person(s) Educated  Patient    Methods  Explanation    Comprehension  Verbalized understanding       PT Short Term Goals - 02/06/18 1250      PT SHORT TERM GOAL #1   Title  Pt will be independent with HEP in order to improve strength and balance in order to decrease fall risk and improve function at home and work.    Time  4    Period  Weeks    Status  New        PT Long Term Goals - 05/22/18 1612      PT LONG TERM GOAL #1   Title  Patient will increase FOTO score to 58 to demonstrate predicted increase in functional mobility to complete ADLs    Baseline  04/11/18 45    Time  8    Period  Weeks    Status  On-going      PT LONG TERM GOAL #2   Title  Pt will increase 10MWT by at least 0.13 m/s in order to demonstrate clinically significant improvement in community ambulation.     Baseline  05/22/18 1.69m/s with no pain    Time  8    Period  Weeks    Status  Achieved      PT LONG TERM GOAL #3   Title  Pt will decrease worst pain as reported on NPRS by at least 3 points in order to demonstrate clinically significant reduction in pain.    Baseline  02/06/18 7/10; 04/11/18 6/10    Time  8    Period  Weeks    Status  On-going      PT LONG TERM GOAL #4   Title   Pt will decrease mODI scoreby at least 13 points in order demonstrate clinically significant reduction in pain/disability    Baseline  05/22/18 30%    Time  8    Period  Weeks    Status  New            Plan - 05/22/18 1720    Clinical Impression Statement  Patient returns to PT following absence d/t scheduling  conflict. Patient reports decreased hip pain with cheif complaint of upper tspine/shoulder pain. Patient is continuing to demonstrate some tension in glute and low back musclature  on R side, and would benefit from strengthening/stability progression of hip/core musculature for this. Patient has thoracic/upper shoulder pain, d/t postural weakness disallowing her to stand with weeighted vest without compensation for work. Patient will benefit from skilled PT to address postural stability and for pain management of these conditions as well. Patient has increased walking speed without difficulty secondary to hip pain management, which she is pleased with. PT will initiae core/hip strengthening as able next visit. Patient with good pain relief following session, reporting no pain. PT encouraged HEP compliance for carry over from session.     Rehab Potential  Good    Clinical Impairments Affecting Rehab Potential  (+) young age, motivation, attempting active lifestyle (-) lack of social support currently, psycho social factors, chronicity of pain, multiple pain sites    PT Frequency  2x / week    PT Duration  8 weeks    PT Treatment/Interventions  ADLs/Self Care Home Management;Aquatic Therapy;Moist Heat;Ultrasound;Traction;Cryotherapy;Electrical Stimulation;Therapeutic exercise;Therapeutic activities;Patient/family education;Functional mobility training;Neuromuscular re-education;Passive range of motion;Dry needling;Manual techniques;Taping;Spinal Manipulations    PT Next Visit Plan  core activation, ST techniques, glute/hip ER TDN    PT Home Exercise Plan  childs pose, seated piriformis stretch, heat, ball rolling    Consulted and Agree with Plan of Care  Patient       Patient will benefit from skilled therapeutic intervention in order to improve the following deficits and impairments:  Pain, Improper body mechanics, Increased fascial restricitons, Abnormal gait, Decreased mobility, Decreased activity  tolerance, Decreased endurance, Postural dysfunction, Decreased strength, Difficulty walking, Impaired flexibility  Visit Diagnosis: Pain in left hip     Problem List Patient Active Problem List   Diagnosis Date Noted  . Gluteal tendinitis of left buttock 04/24/2018  . Anxiety and depression 11/27/2017  . Greater trochanteric bursitis of left hip 01/31/2017  . Low back pain 01/31/2017  . Nonallopathic lesion of lumbosacral region 01/31/2017  . Nonallopathic lesion of sacral region 01/31/2017  . Nonallopathic lesion of thoracic region 01/31/2017  . Dyspnea on exertion 12/18/2016  . Lower abdominal pain 12/18/2016  . Myalgia 12/18/2016  . Palpitations 12/18/2016  . Headache 12/18/2016  . History of occupational exposure to risk factor 12/18/2016   Shelton Silvas PT, DPT  Shelton Silvas 05/22/2018, 5:24 PM  [facility] [address] [place], [address] Phone: [phone]   Fax:  [phone]  Name: ANTONIO WOODHAMS MRN: [id number] Date of Birth: 26-Nov-1980

## 2018-05-27 ENCOUNTER — Ambulatory Visit: Payer: 59 | Admitting: Physical Therapy

## 2018-05-27 ENCOUNTER — Encounter: Payer: Self-pay | Admitting: Physical Therapy

## 2018-05-27 DIAGNOSIS — M25552 Pain in left hip: Secondary | ICD-10-CM

## 2018-05-27 DIAGNOSIS — M546 Pain in thoracic spine: Secondary | ICD-10-CM | POA: Diagnosis not present

## 2018-05-27 NOTE — Therapy (Signed)
[facility] [address] [place], [address] Phone: [phone]   Fax:  [phone]  Physical Therapy Treatment  Patient Details  Name: Sarah Gallagher MRN: [id number] Date of Birth: Apr 30, 1980 Referring Provider (PT): [place]   Encounter Date: 05/27/2018  PT End of Session - 05/27/18 1636    Visit Number  16    Number of Visits  45    Date for PT Re-Evaluation  07/17/18    PT Start Time  0400    PT Stop Time  0447    PT Time Calculation (min)  47 min    Activity Tolerance  Patient tolerated treatment well    Behavior During Therapy  [facility] for tasks assessed/performed       Past Medical History:  Diagnosis Date  . Arthritis   . Chest discomfort    a. associated with palpitations.  . Depression   . Dyspnea on exertion    a. 12/2016 Echo: EF 55-60%, no rwma, nl PASP.  Marland Kitchen Juvenile rheumatoid arthritis ([place])   . Meniere disease   . Migraines   . Palpitations   . PVC's (premature ventricular contractions)    a. 01/2017 48h Holter: Rare PAC/PVC. Freq runs of sinus tachycardia.    Past Surgical History:  Procedure Laterality Date  . KNEE SURGERY  1997, 1997   torn meniscus, ACL replacement  . REFRACTIVE SURGERY     lasix    There were no vitals filed for this visit.  Subjective Assessment - 05/27/18 1624    Subjective  Patient reports bilat lateral/posterior hip pain and mid back pain over the weekend. Patient reports she is only havin 4/10 pain today, but that she did not have to wear lead today.     Pertinent History  Pt is a 38 year old female that has been seen by PCP for left hip bursitis from Oct 2018 until now. Patient reports she has had injections L hip for bursitis and unknow injections in (patient points to SIJ area). Patient reports cheif pain complaint of L hip pain, with secondary complaints of low back pain, R foot pain and cervical pain. Patient reports she saw Nicole Kindred PT for this  condition with DN to the piriformis which she reports "helped for a little while" and DN to paraspinals near L4 which she reports increased tension here. Patient reports the majority of her L hip pain is posterior at the buttock and at the L lumbar spine. Does report occassional "catch" in groin with walking. Patient reports her L buttock/LBP is tender to touch and constantly feels "tight". 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 Patient reports worst pain in the past week 7/10; best 3/10. Pt denies N/V, unexplained weight fluctuation, B&B changes, saddle paresthesia, fever, night sweats, or unrelenting night pain at this time.    Limitations  Sitting;Lifting;Standing;Walking    How long can you sit comfortably?  17min    How long can you stand comfortably?  47min    How long can you walk comfortably?  45min    Diagnostic tests  XRay 1 year ago unremarkable    Patient Stated Goals  Decrease pain, return to exercise regimen (running)    Pain Onset  More than a month ago           Ther-Ex - Posterior pelvic tilt 3sec hold x10 with good carry over  of core activation - Carried over core contraction to glute bridge with GTB at knees for abd activation 2x 10  - Hooklying abd GTB 2x 10 - Hooklying TA march with GTB at knees 2x 10 with TC at low back initially to maintain contact with mat table with good carry over following - Clamshell in side plank 3x 10 each side with demo and cuing initially for set up with min reminders to "keep bottom hip elevated with good carry over following - SL RDL 20# 3x 10 with demo and max VC initially for proper form with good carry over following - [place] split squat 2x 10 with demo and cuing initially for proper form with good carry over following    Manual STM with trigger point release to bilat UT, levator, upper tspine paraspinals, and L lumbar  paraspinals/QL, and L glute med/max/min L3-S1 grade II CPA 30sec bouts 4 bouts each segment for pain management; increased to grade III for increased motion  T3-7 grade II CPA 30sec bouts 4 bouts each segment for pain management; increased to grade III/IV for increased motion with incidental cavitation Following: (4)91mm .30needles placed alongbilat thoracic paraspinals, and bilat glute max/medwith patient positioned in proneto decrease increased muscular spasms and trigger points with the patient positioned inprone.Patient was educated on risks and benefits of therapy and verbally consents to PT.                PT Education - 05/27/18 1626    Education Details  Exercise form    Person(s) Educated  Patient    Methods  Explanation;Demonstration;Verbal cues    Comprehension  Verbalized understanding;Returned demonstration;Verbal cues required       PT Short Term Goals - 02/06/18 1250      PT SHORT TERM GOAL #1   Title  Pt will be independent with HEP in order to improve strength and balance in order to decrease fall risk and improve function at home and work.    Time  4    Period  Weeks    Status  New        PT Long Term Goals - 05/22/18 1612      PT LONG TERM GOAL #1   Title  Patient will increase FOTO score to 58 to demonstrate predicted increase in functional mobility to complete ADLs    Baseline  04/11/18 45    Time  8    Period  Weeks    Status  On-going      PT LONG TERM GOAL #2   Title  Pt will increase 10MWT by at least 0.13 m/s in order to demonstrate clinically significant improvement in community ambulation.     Baseline  05/22/18 1.33m/s with no pain    Time  8    Period  Weeks    Status  Achieved      PT LONG TERM GOAL #3   Title  Pt will decrease worst pain as reported on NPRS by at least 3 points in order to demonstrate clinically significant reduction in pain.    Baseline  02/06/18 7/10; 04/11/18 6/10    Time  8    Period  Weeks    Status   On-going      PT LONG TERM GOAL #4   Title   Pt will decrease mODI scoreby at least 13 points in order demonstrate clinically significant reduction in pain/disability    Baseline  05/22/18 30%    Time  8  Period  Weeks    Status  New            Plan - 05/27/18 1647    Clinical Impression Statement  PT continued TDN and manual techniques for pain management, which patient reports no pain following. PT initiated hip and core stability therex which patient is able to complete with accuracy with PT demo and cuing. Patient demonstrates some difficulty with motor control of core/hip/knee/ankle with SL exercises, which she able to correct 50% with cuing.     Rehab Potential  Good    Clinical Impairments Affecting Rehab Potential  (+) young age, motivation, attempting active lifestyle (-) lack of social support currently, psycho social factors, chronicity of pain, multiple pain sites    PT Frequency  2x / week    PT Duration  8 weeks    PT Treatment/Interventions  ADLs/Self Care Home Management;Aquatic Therapy;Moist Heat;Ultrasound;Traction;Cryotherapy;Electrical Stimulation;Therapeutic exercise;Therapeutic activities;Patient/family education;Functional mobility training;Neuromuscular re-education;Passive range of motion;Dry needling;Manual techniques;Taping;Spinal Manipulations    PT Next Visit Plan  core activation, ST techniques, glute/hip ER TDN    PT Home Exercise Plan  childs pose, seated piriformis stretch, heat, ball rolling    Consulted and Agree with Plan of Care  Patient       Patient will benefit from skilled therapeutic intervention in order to improve the following deficits and impairments:  Pain, Improper body mechanics, Increased fascial restricitons, Abnormal gait, Decreased mobility, Decreased activity tolerance, Decreased endurance, Postural dysfunction, Decreased strength, Difficulty walking, Impaired flexibility  Visit Diagnosis: Pain in left hip     Problem  List Patient Active Problem List   Diagnosis Date Noted  . Gluteal tendinitis of left buttock 04/24/2018  . Anxiety and depression 11/27/2017  . Greater trochanteric bursitis of left hip 01/31/2017  . Low back pain 01/31/2017  . Nonallopathic lesion of lumbosacral region 01/31/2017  . Nonallopathic lesion of sacral region 01/31/2017  . Nonallopathic lesion of thoracic region 01/31/2017  . Dyspnea on exertion 12/18/2016  . Lower abdominal pain 12/18/2016  . Myalgia 12/18/2016  . Palpitations 12/18/2016  . Headache 12/18/2016  . History of occupational exposure to risk factor 12/18/2016   Shelton Silvas PT, DPT Shelton Silvas 05/27/2018, 4:56 PM  [facility] [address] [place], [address] Phone: [phone]   Fax:  [phone]  Name: LUVENA WENTLING MRN: [id number] Date of Birth: [birth year]

## 2018-05-29 ENCOUNTER — Ambulatory Visit: Payer: 59 | Admitting: Physical Therapy

## 2018-05-30 ENCOUNTER — Encounter: Payer: Self-pay | Admitting: Physical Therapy

## 2018-06-03 ENCOUNTER — Encounter: Payer: Self-pay | Admitting: Physical Therapy

## 2018-06-03 ENCOUNTER — Ambulatory Visit: Payer: 59 | Attending: Family Medicine | Admitting: Physical Therapy

## 2018-06-03 DIAGNOSIS — M25552 Pain in left hip: Secondary | ICD-10-CM | POA: Diagnosis not present

## 2018-06-03 NOTE — Therapy (Signed)
[facility] [address], [place], [address] Phone: [phone]   Fax:  [phone]  Physical Therapy Treatment  Patient Details  Name: Sarah Gallagher MRN: [id number] Date of Birth: Apr 01, 1981 Referring Provider (PT): [place]   Encounter Date: 06/03/2018  PT End of Session - 06/03/18 9735    Visit Number  17    Number of Visits  45    Date for PT Re-Evaluation  07/17/18    PT Start Time  0406    PT Stop Time  0445    PT Time Calculation (min)  39 min    Activity Tolerance  Patient tolerated treatment well    Behavior During Therapy  [facility] for tasks assessed/performed       Past Medical History:  Diagnosis Date  . Arthritis   . Chest discomfort    a. associated with palpitations.  . Depression   . Dyspnea on exertion    a. 12/2016 Echo: EF 55-60%, no rwma, nl PASP.  Marland Kitchen Juvenile rheumatoid arthritis ([place])   . Meniere disease   . Migraines   . Palpitations   . PVC's (premature ventricular contractions)    a. 01/2017 48h Holter: Rare PAC/PVC. Freq runs of sinus tachycardia.    Past Surgical History:  Procedure Laterality Date  . KNEE SURGERY  1997, 1997   torn meniscus, ACL replacement  . REFRACTIVE SURGERY     lasix    There were no vitals filed for this visit.  Subjective Assessment - 06/03/18 1607    Subjective  Patient reports some lower back (Lsided) and L hamstring pain that she reports is "tense" and 6/10 pain. Reports no mid back pain. Reports no increased activity that lends to increased pain. Reports some compliance with HEP.     Pertinent History  Pt is a 38 year old female that has been seen by PCP for left hip bursitis from Oct 2018 until now. Patient reports she has had injections L hip for bursitis and unknow injections in (patient points to SIJ area). Patient reports cheif pain complaint of L hip pain, with secondary complaints of low back pain, R foot pain and cervical pain.  Patient reports she saw Nicole Kindred PT for this condition with DN to the piriformis which she reports "helped for a little while" and DN to paraspinals near L4 which she reports increased tension here. Patient reports the majority of her L hip pain is posterior at the buttock and at the L lumbar spine. Does report occassional "catch" in groin with walking. Patient reports her L buttock/LBP is tender to touch and constantly feels "tight". 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 Patient reports worst pain in the past week 7/10; best 3/10. Pt denies N/V, unexplained weight fluctuation, B&B changes, saddle paresthesia, fever, night sweats, or unrelenting night pain at this time.    Limitations  Sitting;Lifting;Standing;Walking    How long can you sit comfortably?  31min    How long can you stand comfortably?  48min    How long can you walk comfortably?  31min    Diagnostic tests  XRay 1 year ago unremarkable    Patient Stated Goals  Decrease pain, return to exercise regimen (running)    Pain Onset  More than a month ago       Ther-Ex - Review of bridge with core activation maintained x10 -  SL bridge 2x 10 with min cuing initially to prevent  Low back ext at end range and for eccentric control with good carry over following  - [place] press 10# 3x 10 each side with demo and min cuing for posture and eccentric control with good carry over following - MATRIX hip abd 55# 3x 10 with min cuing for posture and eccentric control with RLE abd with good carry over following - Lateral step down 3x 10 with demo and min cuing initially for proper form with good carry over following   Manual STM withtrigger point releaseto bilat UT, levator, upper tspine paraspinals, and L lumbar paraspinals/QL, and L glute med/max/min L3-S1 grade II CPA 30sec bouts 4 bouts each segment for pain management; increased to  grade III for increased motion T3-7 grade II CPA 30sec bouts 4 bouts each segment for pain management; increased to grade III/IV for increased motion with incidental cavitation Following:(4)31mm .30needles placed along L lumbar paraspinals/QL and medial hamstringwith patient positioned in proneto decrease increased muscular spasms and trigger points with the patient positioned inprone.Patient was educated on risks and benefits of therapy and verbally consents to PT.                         PT Education - 06/03/18 1613    Education Details  Exercise form    Person(s) Educated  Patient    Methods  Explanation    Comprehension  Verbalized understanding       PT Short Term Goals - 02/06/18 1250      PT SHORT TERM GOAL #1   Title  Pt will be independent with HEP in order to improve strength and balance in order to decrease fall risk and improve function at home and work.    Time  4    Period  Weeks    Status  New        PT Long Term Goals - 05/22/18 1612      PT LONG TERM GOAL #1   Title  Patient will increase FOTO score to 58 to demonstrate predicted increase in functional mobility to complete ADLs    Baseline  04/11/18 45    Time  8    Period  Weeks    Status  On-going      PT LONG TERM GOAL #2   Title  Pt will increase 10MWT by at least 0.13 m/s in order to demonstrate clinically significant improvement in community ambulation.     Baseline  05/22/18 1.25m/s with no pain    Time  8    Period  Weeks    Status  Achieved      PT LONG TERM GOAL #3   Title  Pt will decrease worst pain as reported on NPRS by at least 3 points in order to demonstrate clinically significant reduction in pain.    Baseline  02/06/18 7/10; 04/11/18 6/10    Time  8    Period  Weeks    Status  On-going      PT LONG TERM GOAL #4   Title   Pt will decrease mODI scoreby at least 13 points in order demonstrate clinically significant reduction in pain/disability    Baseline   05/22/18 30%    Time  8    Period  Weeks    Status  New            Plan - 06/03/18 1642    Clinical Impression Statement  PT continued TDB to less areas this session for pain modulation with decreased pain noted from patient following. PT continued therex  progression for core, and hip strengthening with postural motor control. Patient requires cuing for proper form with therex with good carry over following    Rehab Potential  Good    Clinical Impairments Affecting Rehab Potential  (+) young age, motivation, attempting active lifestyle (-) lack of social support currently, psycho social factors, chronicity of pain, multiple pain sites    PT Frequency  2x / week    PT Duration  8 weeks    PT Treatment/Interventions  ADLs/Self Care Home Management;Aquatic Therapy;Moist Heat;Ultrasound;Traction;Cryotherapy;Electrical Stimulation;Therapeutic exercise;Therapeutic activities;Patient/family education;Functional mobility training;Neuromuscular re-education;Passive range of motion;Dry needling;Manual techniques;Taping;Spinal Manipulations    PT Next Visit Plan  core activation, ST techniques, glute/hip ER TDN    PT Home Exercise Plan  childs pose, seated piriformis stretch, heat, ball rolling    Consulted and Agree with Plan of Care  Patient       Patient will benefit from skilled therapeutic intervention in order to improve the following deficits and impairments:  Pain, Improper body mechanics, Increased fascial restricitons, Abnormal gait, Decreased mobility, Decreased activity tolerance, Decreased endurance, Postural dysfunction, Decreased strength, Difficulty walking, Impaired flexibility  Visit Diagnosis: Pain in left hip     Problem List Patient Active Problem List   Diagnosis Date Noted  . Gluteal tendinitis of left buttock 04/24/2018  . Anxiety and depression 11/27/2017  . Greater trochanteric bursitis of left hip 01/31/2017  . Low back pain 01/31/2017  . Nonallopathic lesion  of lumbosacral region 01/31/2017  . Nonallopathic lesion of sacral region 01/31/2017  . Nonallopathic lesion of thoracic region 01/31/2017  . Dyspnea on exertion 12/18/2016  . Lower abdominal pain 12/18/2016  . Myalgia 12/18/2016  . Palpitations 12/18/2016  . Headache 12/18/2016  . History of occupational exposure to risk factor 12/18/2016   Shelton Silvas PT, DPT Shelton Silvas 06/03/2018, 4:49 PM  [facility] [address], [place], [address] Phone: [phone]   Fax:  [phone]  Name: ZILLAH ALEXIE MRN: [id number] Date of Birth: [birth year]

## 2018-06-05 ENCOUNTER — Ambulatory Visit: Payer: 59 | Admitting: Physical Therapy

## 2018-06-05 ENCOUNTER — Ambulatory Visit: Payer: 59

## 2018-06-06 ENCOUNTER — Encounter: Payer: Self-pay | Admitting: Physical Therapy

## 2018-06-10 ENCOUNTER — Ambulatory Visit: Payer: 59 | Admitting: Physical Therapy

## 2018-06-12 ENCOUNTER — Ambulatory Visit: Payer: 59 | Admitting: Physical Therapy

## 2018-06-12 ENCOUNTER — Encounter: Payer: Self-pay | Admitting: Physical Therapy

## 2018-06-12 ENCOUNTER — Other Ambulatory Visit: Payer: Self-pay

## 2018-06-12 DIAGNOSIS — M25552 Pain in left hip: Secondary | ICD-10-CM

## 2018-06-12 NOTE — Therapy (Signed)
[facility] [address], [place], [address] Phone: [phone]   Fax:  [phone]  Physical Therapy Treatment  Patient Details  Name: Sarah Gallagher MRN: [id number] Date of Birth: 16-May-1980 Referring Provider (PT): [place]   Encounter Date: 06/12/2018  PT End of Session - 06/12/18 1630    Visit Number  18    Number of Visits  45    Date for PT Re-Evaluation  07/17/18    PT Start Time  0406    PT Stop Time  0445    PT Time Calculation (min)  39 min    Activity Tolerance  Patient tolerated treatment well    Behavior During Therapy  [facility] for tasks assessed/performed       Past Medical History:  Diagnosis Date  . Arthritis   . Chest discomfort    a. associated with palpitations.  . Depression   . Dyspnea on exertion    a. 12/2016 Echo: EF 55-60%, no rwma, nl PASP.  Marland Kitchen Juvenile rheumatoid arthritis ([place])   . Meniere disease   . Migraines   . Palpitations   . PVC's (premature ventricular contractions)    a. 01/2017 48h Holter: Rare PAC/PVC. Freq runs of sinus tachycardia.    Past Surgical History:  Procedure Laterality Date  . KNEE SURGERY  1997, 1997   torn meniscus, ACL replacement  . REFRACTIVE SURGERY     lasix    There were no vitals filed for this visit.  Subjective Assessment - 06/12/18 1608    Subjective  Patient reports her hamstring is "much better" and she is having most pain at L low back to date, reporting a lot of tension    Pertinent History  Pt is a 38 year old female that has been seen by PCP for left hip bursitis from Oct 2018 until now. Patient reports she has had injections L hip for bursitis and unknow injections in (patient points to SIJ area). Patient reports cheif pain complaint of L hip pain, with secondary complaints of low back pain, R foot pain and cervical pain. Patient reports she saw Nicole Kindred PT for this condition with DN to the piriformis which she reports "helped  for a little while" and DN to paraspinals near L4 which she reports increased tension here. Patient reports the majority of her L hip pain is posterior at the buttock and at the L lumbar spine. Does report occassional "catch" in groin with walking. Patient reports her L buttock/LBP is tender to touch and constantly feels "tight". 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 Patient reports worst pain in the past week 7/10; best 3/10. Pt denies N/V, unexplained weight fluctuation, B&B changes, saddle paresthesia, fever, night sweats, or unrelenting night pain at this time.    Limitations  Sitting;Lifting;Standing;Walking    How long can you sit comfortably?  35min    How long can you stand comfortably?  45min    How long can you walk comfortably?  13min    Diagnostic tests  XRay 1 year ago unremarkable    Patient Stated Goals  Decrease pain, return to exercise regimen (running)    Pain Onset  More than a month ago       Ther-Ex - Review of bridge with core activation maintained x10 - TA marches from 4in step 3x 10 each LE with min cuing for proper  form with good carry over following - Palloff press 15# 3x 10 each side with demo and min cuing for posture and eccentric control with good carry over following - TRX SL squat 3x 10 each LE with increased difficulty on RLE with increased cuing needed for proper hip/knee/foot alignment with good carry over  Manual STM withtrigger point releaseto L UT, L levator, L cervical paraspinals, and L lumbar paraspinals/QL, and L glute med/max/min L3-S1 grade II CPA 30sec bouts 4 bouts each segment for pain management; increased to grade III for increased motion T3-7 grade II CPA 30sec bouts 4 bouts each segment for pain management; increased to grade III/IV for increased motion with incidental cavitation Following:(4)45mm .30needles placed along L  lumbar paraspinals/QL and medial hamstringwith patient positioned in proneto decrease increased muscular spasms and trigger points with the patient positioned inprone.Patient was educated on risks and benefits of therapy and verbally consents to PT.                        PT Education - 06/12/18 1624    Education Details  TDM, exercise form    Person(s) Educated  Patient    Methods  Explanation;Verbal cues    Comprehension  Verbalized understanding;Verbal cues required       PT Short Term Goals - 02/06/18 1250      PT SHORT TERM GOAL #1   Title  Pt will be independent with HEP in order to improve strength and balance in order to decrease fall risk and improve function at home and work.    Time  4    Period  Weeks    Status  New        PT Long Term Goals - 05/22/18 1612      PT LONG TERM GOAL #1   Title  Patient will increase FOTO score to 58 to demonstrate predicted increase in functional mobility to complete ADLs    Baseline  04/11/18 45    Time  8    Period  Weeks    Status  On-going      PT LONG TERM GOAL #2   Title  Pt will increase 10MWT by at least 0.13 m/s in order to demonstrate clinically significant improvement in community ambulation.     Baseline  05/22/18 1.77m/s with no pain    Time  8    Period  Weeks    Status  Achieved      PT LONG TERM GOAL #3   Title  Pt will decrease worst pain as reported on NPRS by at least 3 points in order to demonstrate clinically significant reduction in pain.    Baseline  02/06/18 7/10; 04/11/18 6/10    Time  8    Period  Weeks    Status  On-going      PT LONG TERM GOAL #4   Title   Pt will decrease mODI scoreby at least 13 points in order demonstrate clinically significant reduction in pain/disability    Baseline  05/22/18 30%    Time  8    Period  Weeks    Status  New            Plan - 06/12/18 1645    Clinical Impression Statement  Patient continues to report less pain following TDN with less  affected areas this session. PT continued therex progression which patient is able to complete with accuracy following PT cuing with correct muscle activation and posture.  Rehab Potential  Good    Clinical Impairments Affecting Rehab Potential  (+) young age, motivation, attempting active lifestyle (-) lack of social support currently, psycho social factors, chronicity of pain, multiple pain sites    PT Frequency  2x / week    PT Duration  8 weeks    PT Treatment/Interventions  ADLs/Self Care Home Management;Aquatic Therapy;Moist Heat;Ultrasound;Traction;Cryotherapy;Electrical Stimulation;Therapeutic exercise;Therapeutic activities;Patient/family education;Functional mobility training;Neuromuscular re-education;Passive range of motion;Dry needling;Manual techniques;Taping;Spinal Manipulations    PT Next Visit Plan  core activation, ST techniques, glute/hip ER TDN    PT Home Exercise Plan  childs pose, seated piriformis stretch, heat, ball rolling    Consulted and Agree with Plan of Care  Patient       Patient will benefit from skilled therapeutic intervention in order to improve the following deficits and impairments:  Pain, Improper body mechanics, Increased fascial restricitons, Abnormal gait, Decreased mobility, Decreased activity tolerance, Decreased endurance, Postural dysfunction, Decreased strength, Difficulty walking, Impaired flexibility  Visit Diagnosis: Pain in left hip     Problem List Patient Active Problem List   Diagnosis Date Noted  . Gluteal tendinitis of left buttock 04/24/2018  . Anxiety and depression 11/27/2017  . Greater trochanteric bursitis of left hip 01/31/2017  . Low back pain 01/31/2017  . Nonallopathic lesion of lumbosacral region 01/31/2017  . Nonallopathic lesion of sacral region 01/31/2017  . Nonallopathic lesion of thoracic region 01/31/2017  . Dyspnea on exertion 12/18/2016  . Lower abdominal pain 12/18/2016  . Myalgia 12/18/2016  . Palpitations  12/18/2016  . Headache 12/18/2016  . History of occupational exposure to risk factor 12/18/2016   Shelton Silvas PT, DPT  Shelton Silvas 06/12/2018, 4:51 PM  [facility] [address] [place], [address] Phone: [phone]   Fax:  [phone]  Name: Sarah Gallagher MRN: [id number] Date of Birth: 1980/09/06

## 2018-06-13 ENCOUNTER — Other Ambulatory Visit: Payer: Self-pay

## 2018-06-13 ENCOUNTER — Encounter: Payer: Self-pay | Admitting: Physical Therapy

## 2018-06-17 ENCOUNTER — Ambulatory Visit: Payer: 59 | Admitting: Physical Therapy

## 2018-06-19 ENCOUNTER — Ambulatory Visit: Payer: 59 | Admitting: Physical Therapy

## 2018-06-19 ENCOUNTER — Other Ambulatory Visit: Payer: Self-pay

## 2018-06-19 ENCOUNTER — Encounter: Payer: Self-pay | Admitting: Physical Therapy

## 2018-06-19 DIAGNOSIS — M25552 Pain in left hip: Secondary | ICD-10-CM

## 2018-06-19 NOTE — Therapy (Signed)
[facility] [address] [place], [address] Phone: [phone]   Fax:  [phone]  Physical Therapy Treatment  Patient Details  Name: Sarah Gallagher MRN: [id number] Date of Birth: Jul 26, 1980 Referring Provider (PT): [place]   Encounter Date: 06/19/2018  PT End of Session - 06/19/18 1650    Visit Number  19    Number of Visits  45    Date for PT Re-Evaluation  07/17/18    PT Start Time  0418    PT Stop Time  0445    PT Time Calculation (min)  27 min    Activity Tolerance  Patient tolerated treatment well    Behavior During Therapy  [facility] for tasks assessed/performed       Past Medical History:  Diagnosis Date  . Arthritis   . Chest discomfort    a. associated with palpitations.  . Depression   . Dyspnea on exertion    a. 12/2016 Echo: EF 55-60%, no rwma, nl PASP.  Marland Kitchen Juvenile rheumatoid arthritis ([place])   . Meniere disease   . Migraines   . Palpitations   . PVC's (premature ventricular contractions)    a. 01/2017 48h Holter: Rare PAC/PVC. Freq runs of sinus tachycardia.    Past Surgical History:  Procedure Laterality Date  . KNEE SURGERY  1997, 1997   torn meniscus, ACL replacement  . REFRACTIVE SURGERY     lasix    There were no vitals filed for this visit.  Manual STM withtrigger point releaseto R UT, R levator, R cervical/thoracic paraspinals, and L lumbar paraspinals/QL, and L glute med/max/min L3-S1 grade II CPA 30sec bouts 4 bouts each segment for pain management; increased to grade III for increased motion T3-7 grade II CPA 30sec bouts 4 bouts each segment for pain management; increased to grade III/IV for increased motion with incidental cavitation Following:(4)74mm .30needles placed alongL lumbar paraspinals/QL and medial hamstringwith patient positioned in proneto decrease increased muscular spasms and trigger points with the patient positioned inprone.Patient was  educated on risks and benefits of therapy and verbally consents to PT.                        PT Education - 06/19/18 1650    Education Details  TDN; HEP review    Person(s) Educated  Patient    Methods  Explanation    Comprehension  Verbalized understanding       PT Short Term Goals - 02/06/18 1250      PT SHORT TERM GOAL #1   Title  Pt will be independent with HEP in order to improve strength and balance in order to decrease fall risk and improve function at home and work.    Time  4    Period  Weeks    Status  New        PT Long Term Goals - 05/22/18 1612      PT LONG TERM GOAL #1   Title  Patient will increase FOTO score to 58 to demonstrate predicted increase in functional mobility to complete ADLs    Baseline  04/11/18 45    Time  8    Period  Weeks    Status  On-going      PT LONG TERM GOAL #2   Title  Pt will increase 10MWT by at least 0.13 m/s in order to demonstrate clinically significant improvement in community ambulation.     Baseline  05/22/18 1.36m/s with no pain    Time  8    Period  Weeks    Status  Achieved      PT LONG TERM GOAL #3   Title  Pt will decrease worst pain as reported on NPRS by at least 3 points in order to demonstrate clinically significant reduction in pain.    Baseline  02/06/18 7/10; 04/11/18 6/10    Time  8    Period  Weeks    Status  On-going      PT LONG TERM GOAL #4   Title   Pt will decrease mODI scoreby at least 13 points in order demonstrate clinically significant reduction in pain/disability    Baseline  05/22/18 30%    Time  8    Period  Weeks    Status  New            Plan - 06/20/18 1411    Clinical Impression Statement  Patient reports to PT very late, allowing minimal time for treatment. PT completed manual and TDN techniques to reduce pain and muscle tension and encouraged patietn to complete strengtheingactivation therex for core and glutes at home d/t limited time. Patient verbalized  understanding. Patient reports pain is "much better" following session    Rehab Potential  Good    Clinical Impairments Affecting Rehab Potential  (+) young age, motivation, attempting active lifestyle (-) lack of social support currently, psycho social factors, chronicity of pain, multiple pain sites    PT Frequency  2x / week    PT Duration  8 weeks    PT Treatment/Interventions  ADLs/Self Care Home Management;Aquatic Therapy;Moist Heat;Ultrasound;Traction;Cryotherapy;Electrical Stimulation;Therapeutic exercise;Therapeutic activities;Patient/family education;Functional mobility training;Neuromuscular re-education;Passive range of motion;Dry needling;Manual techniques;Taping;Spinal Manipulations    PT Next Visit Plan  core activation, ST techniques, glute/hip ER TDN    PT Home Exercise Plan  childs pose, seated piriformis stretch, heat, ball rolling    Consulted and Agree with Plan of Care  Patient       Patient will benefit from skilled therapeutic intervention in order to improve the following deficits and impairments:  Pain, Improper body mechanics, Increased fascial restricitons, Abnormal gait, Decreased mobility, Decreased activity tolerance, Decreased endurance, Postural dysfunction, Decreased strength, Difficulty walking, Impaired flexibility  Visit Diagnosis: Pain in left hip     Problem List Patient Active Problem List   Diagnosis Date Noted  . Gluteal tendinitis of left buttock 04/24/2018  . Anxiety and depression 11/27/2017  . Greater trochanteric bursitis of left hip 01/31/2017  . Low back pain 01/31/2017  . Nonallopathic lesion of lumbosacral region 01/31/2017  . Nonallopathic lesion of sacral region 01/31/2017  . Nonallopathic lesion of thoracic region 01/31/2017  . Dyspnea on exertion 12/18/2016  . Lower abdominal pain 12/18/2016  . Myalgia 12/18/2016  . Palpitations 12/18/2016  . Headache 12/18/2016  . History of occupational exposure to risk factor 12/18/2016    Shelton Silvas PT, DPT Shelton Silvas 06/20/2018, 2:28 PM  [facility] [address] [place], [address] Phone: [phone]   Fax:  [phone]  Name: SKYLIN KENNERSON MRN: [id number] Date of Birth: 1981/01/27

## 2018-06-20 ENCOUNTER — Encounter: Payer: Self-pay | Admitting: Physical Therapy

## 2018-06-24 ENCOUNTER — Encounter: Payer: 59 | Admitting: Physical Therapy

## 2018-06-26 ENCOUNTER — Ambulatory Visit: Payer: 59 | Admitting: Physical Therapy

## 2018-06-27 ENCOUNTER — Encounter: Payer: Self-pay | Admitting: Physical Therapy

## 2018-06-27 DIAGNOSIS — K130 Diseases of lips: Secondary | ICD-10-CM | POA: Diagnosis not present

## 2018-06-27 DIAGNOSIS — L700 Acne vulgaris: Secondary | ICD-10-CM | POA: Diagnosis not present

## 2018-07-01 ENCOUNTER — Encounter: Payer: 59 | Admitting: Physical Therapy

## 2018-07-03 ENCOUNTER — Encounter: Payer: 59 | Admitting: Physical Therapy

## 2018-07-04 ENCOUNTER — Encounter: Payer: Self-pay | Admitting: Physical Therapy

## 2018-07-08 ENCOUNTER — Encounter: Payer: Self-pay | Admitting: Physical Therapy

## 2018-07-10 ENCOUNTER — Encounter: Payer: 59 | Admitting: Physical Therapy

## 2018-07-14 NOTE — Progress Notes (Deleted)
[facility] [place] Sarah Gallagher, [place] [address] Phone: [phone] Subjective:    I'm seeing this patient by the request  of:    CC:   [id number]  JOHNANNA Gallagher is a 38 y.o. female coming in with complaint of ***  Onset-  Location Duration-  Character- Aggravating factors- Reliving factors-  Therapies tried-  Severity-     Past Medical History:  Diagnosis Date  . Arthritis   . Chest discomfort    a. associated with palpitations.  . Depression   . Dyspnea on exertion    a. 12/2016 Echo: EF 55-60%, no rwma, nl PASP.  Marland Kitchen Juvenile rheumatoid arthritis ([place])   . Meniere disease   . Migraines   . Palpitations   . PVC's (premature ventricular contractions)    a. 01/2017 48h Holter: Rare PAC/PVC. Freq runs of sinus tachycardia.   Past Surgical History:  Procedure Laterality Date  . KNEE SURGERY  1997, 1997   torn meniscus, ACL replacement  . REFRACTIVE SURGERY     lasix   Social History   Socioeconomic History  . Marital status: Married    Spouse name: Not on file  . Number of children: 3  . Years of education: BS  . Highest education level: Not on file  Occupational History    Comment: [employer], Resp Therapist  Social Needs  . Financial resource strain: Not on file  . Food insecurity:    Worry: Not on file    Inability: Not on file  . Transportation needs:    Medical: Not on file    Non-medical: Not on file  Tobacco Use  . Smoking status: Never Smoker  . Smokeless tobacco: Never Used  Substance and Sexual Activity  . Alcohol use: Yes    Alcohol/week: 0.0 standard drinks    Comment: rare  . Drug use: No  . Sexual activity: Yes    Birth control/protection: Injection  Lifestyle  . Physical activity:    Days per week: Not on file    Minutes per session: Not on file  . Stress: Not on file  Relationships  . Social connections:    Talks on phone: Not on file    Gets together: Not on file   Attends religious service: Not on file    Active member of club or organization: Not on file    Attends meetings of clubs or organizations: Not on file    Relationship status: Not on file  Other Topics Concern  . Not on file  Social History Narrative   Lives in [place] with husband and children.  Not currently exercising.  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   Caffeine 2-3 cups daily   Allergies  Allergen Reactions  . Zithromax [Azithromycin] Other (See Comments)    "convulsions or tremors"   Family History  Problem Relation Age of Onset  . Diabetes Mother   . Arthritis Father   . Other Father        atrial flutter  . Emphysema Father        smoker  . Diabetes Maternal Aunt   . Diabetes Maternal Uncle   . Stroke Paternal Aunt   . Arthritis Maternal Grandmother   . Diabetes Maternal Grandmother   . Cancer Maternal Grandmother        skin  . Arthritis Paternal Grandmother   . Cancer Paternal Grandmother        skin    Current Outpatient  Medications (Endocrine & Metabolic):  .  medroxyPROGESTERone (DEPO-PROVERA) 150 MG/ML injection, Inject 1 mL (150 mg total) into the muscle every 3 (three) months.   Current Outpatient Medications (Respiratory):  .  albuterol (PROVENTIL HFA;VENTOLIN HFA) 108 (90 Base) MCG/ACT inhaler, Inhale 1-2 puffs into the lungs every 6 (six) hours as needed for wheezing or shortness of breath. .  fluticasone (FLONASE) 50 MCG/ACT nasal spray, Place into the nose.  Current Outpatient Medications (Analgesics):  .  aspirin-acetaminophen-caffeine (EXCEDRIN MIGRAINE) 250-250-65 MG tablet, Take by mouth daily. .  SUMAtriptan (IMITREX) 50 MG tablet, 50 mg as needed.   Current Outpatient Medications (Other):  .  baclofen (LIORESAL) 10 MG tablet, Take 1 tablet (10 mg total) by mouth 3 (three) times daily. Marland Kitchen  doxycycline (ORACEA) 40 MG capsule, Take 40 mg by mouth every morning. .  gabapentin (NEURONTIN) 100 MG capsule, Take 2 capsules (200 mg total) by mouth at bedtime.  .  pantoprazole (PROTONIX) 40 MG tablet, Take 40 mg by mouth daily. .  Vitamin D, Ergocalciferol, (DRISDOL) 1.25 MG (50000 UT) CAPS capsule, Take 1 capsule (50,000 Units total) by mouth every 7 (seven) days.    Past medical history, social, surgical and family history all reviewed in electronic medical record.  No pertanent information unless stated regarding to the chief complaint.   Review of Systems:  No headache, visual changes, nausea, vomiting, diarrhea, constipation, dizziness, abdominal pain, skin rash, fevers, chills, night sweats, weight loss, swollen lymph nodes, body aches, joint swelling, muscle aches, chest pain, shortness of breath, mood changes.   Objective  There were no vitals taken for this visit. Systems examined below as of    General: No apparent distress alert and oriented x3 mood and affect normal, dressed appropriately.  HEENT: Pupils equal, extraocular movements intact  Respiratory: Patient's speak in full sentences and does not appear short of breath  Cardiovascular: No lower extremity edema, non tender, no erythema  Skin: Warm dry intact with no signs of infection or rash on extremities or on axial skeleton.  Abdomen: Soft nontender  Neuro: Cranial nerves II through XII are intact, neurovascularly intact in all extremities with 2+ DTRs and 2+ pulses.  Lymph: No lymphadenopathy of posterior or anterior cervical chain or axillae bilaterally.  Gait normal with good balance and coordination.  MSK:  Non tender with full range of motion and good stability and symmetric strength and tone of shoulders, elbows, wrist, hip, knee and ankles bilaterally.     Impression and Recommendations:     This case required medical decision making of moderate complexity. The above documentation has been reviewed and is accurate and complete Sarah Pulley, DO       Note: This dictation was prepared with Dragon dictation along with smaller phrase technology. Any  transcriptional errors that result from this process are unintentional.

## 2018-07-15 ENCOUNTER — Ambulatory Visit: Payer: Self-pay | Admitting: Family Medicine

## 2018-07-15 MED FILL — medroxyPROGESTERone ACETATE: 150 | 90 days supply | Qty: 1 | Fill #0

## 2018-07-16 ENCOUNTER — Ambulatory Visit (INDEPENDENT_AMBULATORY_CARE_PROVIDER_SITE_OTHER): Payer: 59 | Admitting: Family Medicine

## 2018-07-16 ENCOUNTER — Encounter: Payer: Self-pay | Admitting: Family Medicine

## 2018-07-16 ENCOUNTER — Ambulatory Visit: Payer: Self-pay

## 2018-07-16 ENCOUNTER — Other Ambulatory Visit: Payer: Self-pay

## 2018-07-16 VITALS — BP 102/62 | HR 70 | Ht 66.0 in | Wt 168.0 lb

## 2018-07-16 DIAGNOSIS — M25552 Pain in left hip: Secondary | ICD-10-CM

## 2018-07-16 DIAGNOSIS — M7062 Trochanteric bursitis, left hip: Secondary | ICD-10-CM

## 2018-07-16 NOTE — Progress Notes (Signed)
[facility] [place], Sarah Gallagher [id number] Phone: [phone] Subjective:   Sarah Gallagher, am serving as a scribe for Dr. Hulan Saas.  I'm seeing this patient by the request  of:    CC: Left hip pain follow-up  [id number]  Sarah Gallagher is 05/20/2018: Responded well to the injection.  Discussed in core strengthening, hip abductor strengthening that I think will be beneficial as well.  Patient is to make these improvements and continue with dry needling with physical therapy. A dry needling for more the upper back as well.  Follow-up with me again in 4 to 6 weeks.  Update 07/16/2018: a 38 y.o. female coming in with complaint of left glute pain. Patient states that she was going to physical therapy which was helping to decrease her muscle pain. She has been having pain over the left side of abdomen as well as down into the left glute and hamstring. Patient said it feels like she needs to stretch. Has been using IBU and Tylenol daily.   Patient is also having an increase in left lateral hip pain over the bursae.  Patient states this is little different than the gluteal pain previously.      Past Medical History:  Diagnosis Date  . Arthritis   . Chest discomfort    a. associated with palpitations.  . Depression   . Dyspnea on exertion    a. 12/2016 Echo: EF 55-60%, Gallagher rwma, nl PASP.  Marland Kitchen Juvenile rheumatoid arthritis ([place])   . Meniere disease   . Migraines   . Palpitations   . PVC's (premature ventricular contractions)    a. 01/2017 48h Holter: Rare PAC/PVC. Freq runs of sinus tachycardia.   Past Surgical History:  Procedure Laterality Date  . KNEE SURGERY  1997, 1997   torn meniscus, ACL replacement  . REFRACTIVE SURGERY     lasix   Social History   Socioeconomic History  . Marital status: Married    Spouse name: Not on file  . Number of children: 3  . Years of education: BS  . Highest education level: Not on file   Occupational History    Comment: [employer], Resp Therapist  Social Needs  . Financial resource strain: Not on file  . Food insecurity:    Worry: Not on file    Inability: Not on file  . Transportation needs:    Medical: Not on file    Non-medical: Not on file  Tobacco Use  . Smoking status: Never Smoker  . Smokeless tobacco: Never Used  Substance and Sexual Activity  . Alcohol use: Yes    Alcohol/week: 0.0 standard drinks    Comment: rare  . Drug use: Gallagher  . Sexual activity: Yes    Birth control/protection: Injection  Lifestyle  . Physical activity:    Days per week: Not on file    Minutes per session: Not on file  . Stress: Not on file  Relationships  . Social connections:    Talks on phone: Not on file    Gets together: Not on file    Attends religious service: Not on file    Active member of club or organization: Not on file    Attends meetings of clubs or organizations: Not on file    Relationship status: Not on file  Other Topics Concern  . Not on file  Social History Narrative   Lives in [place] with husband and children.  Not currently exercising.  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   Caffeine 2-3 cups daily   Allergies  Allergen Reactions  . Zithromax [Azithromycin] Other (See Comments)    "convulsions or tremors"   Family History  Problem Relation Age of Onset  . Diabetes Mother   . Arthritis Father   . Other Father        atrial flutter  . Emphysema Father        smoker  . Diabetes Maternal Aunt   . Diabetes Maternal Uncle   . Stroke Paternal Aunt   . Arthritis Maternal Grandmother   . Diabetes Maternal Grandmother   . Cancer Maternal Grandmother        skin  . Arthritis Paternal Grandmother   . Cancer Paternal Grandmother        skin    Current Outpatient Medications (Endocrine & Metabolic):  .  medroxyPROGESTERone (DEPO-PROVERA) 150 MG/ML injection, Inject 1 mL (150 mg total) into the muscle every 3 (three) months.   Current Outpatient  Medications (Respiratory):  .  albuterol (PROVENTIL HFA;VENTOLIN HFA) 108 (90 Base) MCG/ACT inhaler, Inhale 1-2 puffs into the lungs every 6 (six) hours as needed for wheezing or shortness of breath. .  fluticasone (FLONASE) 50 MCG/ACT nasal spray, Place into the nose.  Current Outpatient Medications (Analgesics):  .  aspirin-acetaminophen-caffeine (EXCEDRIN MIGRAINE) 250-250-65 MG tablet, Take by mouth daily. .  SUMAtriptan (IMITREX) 50 MG tablet, 50 mg as needed.   Current Outpatient Medications (Other):  .  baclofen (LIORESAL) 10 MG tablet, Take 1 tablet (10 mg total) by mouth 3 (three) times daily. Marland Kitchen  doxycycline (ORACEA) 40 MG capsule, Take 40 mg by mouth every morning. .  gabapentin (NEURONTIN) 100 MG capsule, Take 2 capsules (200 mg total) by mouth at bedtime. .  pantoprazole (PROTONIX) 40 MG tablet, Take 40 mg by mouth daily. .  Vitamin D, Ergocalciferol, (DRISDOL) 1.25 MG (50000 UT) CAPS capsule, Take 1 capsule (50,000 Units total) by mouth every 7 (seven) days.    Past medical history, social, surgical and family history all reviewed in electronic medical record.  Gallagher pertanent information unless stated regarding to the chief complaint.   Review of Systems:  Gallagher headache, visual changes, nausea, vomiting, diarrhea, constipation, dizziness, abdominal pain, skin rash, fevers, chills, night sweats, weight loss, swollen lymph nodes, body aches, joint swelling,chest pain, shortness of breath, mood changes.  Positive muscle aches  Objective  Blood pressure 102/62, pulse 70, height 5\' 6"  (1.676 m), weight 168 lb (76.2 kg), SpO2 97 %.   General: Gallagher apparent distress alert and oriented x3 mood and affect normal, dressed appropriately.  HEENT: Pupils equal, extraocular movements intact  Respiratory: Patient's speak in full sentences and does not appear short of breath  Cardiovascular: Gallagher lower extremity edema, non tender, Gallagher erythema  Skin: Warm dry intact with Gallagher signs of infection or  rash on extremities or on axial skeleton.  Abdomen: Soft nontender  Neuro: Cranial nerves II through XII are intact, neurovascularly intact in all extremities with 2+ DTRs and 2+ pulses.  Lymph: Gallagher lymphadenopathy of posterior or anterior cervical chain or axillae bilaterally.  Gait normal with good balance and coordination.  MSK:  Non tender with full range of motion and good stability and symmetric strength and tone of  elbows, wrist,  knee and ankles bilaterally.  Left hip exam shows the patient does have pain over more over the greater trochanteric area patient is less pain over the gluteal area.  Mild more tightness  over the psoas on the left side than previous.   Procedure: Real-time Ultrasound Guided Injection of left  greater trochanteric bursitis secondary to patient's body habitus Device: GE Logiq Q7  Ultrasound guided injection is preferred based studies that show increased duration, increased effect, greater accuracy, decreased procedural pain, increased response rate, and decreased cost with ultrasound guided versus blind injection.  Verbal informed consent obtained.  Time-out conducted.  Noted Gallagher overlying erythema, induration, or other signs of local infection.  Skin prepped in a sterile fashion.  Local anesthesia: Topical Ethyl chloride.  With sterile technique and under real time ultrasound guidance:  Greater trochanteric area was visualized and patient's bursa was noted. A 22-gauge 3 inch needle was inserted and 4 cc of 0.5% Marcaine and 1 cc of Kenalog 40 mg/dL was injected. Pictures taken Completed without difficulty  Pain immediately resolved suggesting accurate placement of the medication.  Advised to call if fevers/chills, erythema, induration, drainage, or persistent bleeding.  Images permanently stored and available for review in the ultrasound unit.  Impression: Technically successful ultrasound guided injection.    Impression and Recommendations:     This case  required medical decision making of moderate complexity. The above documentation has been reviewed and is accurate and complete Lyndal Pulley, DO       Note: This dictation was prepared with Dragon dictation along with smaller phrase technology. Any transcriptional errors that result from this process are unintentional.

## 2018-07-16 NOTE — Assessment & Plan Note (Signed)
Patient given injection tolerated the procedure well.  Discussed icing regimen and home exercise.  Discussed which activities to do which was to avoid.  Discussed that if this does not seem to work we may need to increase the gabapentin or consider further imaging with advanced imaging.

## 2018-07-16 NOTE — Therapy (Signed)
[place] MAIN [facility] SERVICES [address] [place], [place], [address] Phone: [phone]   Fax:  [phone]  Patient Details  Name: Sarah Gallagher MRN: [id number] Date of Birth: 1980-07-12 Referring Provider:  No ref. provider found  Encounter Date: 07/16/2018  The [facility] outpatient clinics are closed at this time due to the COVID-19 epidemic. The patient was contacted in regards to their therapy services. The patient is in agreement that they are safe and consent to being on hold for therapy services until the [facility] outpatient facilities reopen. At which time, the patient will be contacted to schedule an appointment to resume therapy services.    Blythe Stanford, PT DPT 07/16/2018, 10:25 AM  [place] MAIN [facility] SERVICES [address] [place], [place], [address] Phone: [phone]   Fax:  [phone]

## 2018-07-16 NOTE — Patient Instructions (Signed)
Great to see you  I think you will do great overall  Ice 20 minutes 2 times daily. Usually after activity and before bed. Miralax 17grams daily  Colace 100mg  daily  Keep trucking along  I am here if you need me

## 2018-07-17 ENCOUNTER — Encounter: Payer: 59 | Admitting: Physical Therapy

## 2018-07-22 ENCOUNTER — Encounter: Payer: Self-pay | Admitting: Physical Therapy

## 2018-07-24 ENCOUNTER — Encounter: Payer: 59 | Admitting: Physical Therapy

## 2018-07-29 ENCOUNTER — Encounter: Payer: Self-pay | Admitting: Physical Therapy

## 2018-07-31 ENCOUNTER — Encounter: Payer: 59 | Admitting: Physical Therapy

## 2018-08-28 ENCOUNTER — Telehealth: Payer: Self-pay | Admitting: *Deleted

## 2018-08-28 NOTE — Telephone Encounter (Signed)
Copied from [place] [id number]. Topic: Appointment Scheduling - Scheduling Inquiry for Clinic >> Aug 27, 2018  4:28 PM Marin Olp L wrote: Reason for CRM: Needs cpe appt and low back and hip mri.

## 2018-08-30 NOTE — Telephone Encounter (Signed)
I am happy to have her scheduled for a CPE. I have not seen her for back or hip issues. She has seen Dr Tamala Julian of [place] and it would likely be easier for her to contact them to determine the need for an MRI.

## 2018-08-30 NOTE — Telephone Encounter (Signed)
Per pt needs a MRI for hip and Low back. Thank you!

## 2018-09-02 ENCOUNTER — Encounter: Payer: Self-pay | Admitting: Family Medicine

## 2018-09-02 NOTE — Telephone Encounter (Signed)
Pt has been scheduled for Physical. Dr Caryl Bis states she needs to see Dr Tamala Julian of [place] for him to determine the need for MRI. I expressed to pt what Dr Caryl Bis said she still wanted me to send another note after he explained what she needs to do. I advised pt to send note to Dr Caryl Bis so she can better explain why she feels she needs Dr Caryl Bis to put a order in.

## 2018-09-02 NOTE — Telephone Encounter (Signed)
Ok. She has one scheduled for 8/12 Thank you!

## 2018-09-03 NOTE — Telephone Encounter (Signed)
I have reviewed Dr Thompson Caul notes from earlier this year. Would she be willing to do a virtual visit on Wednesday at 4:30 or on Friday so I can get a good history to accurately determine if she needs the lumbar MRI, hip MRI, or if she needs both?

## 2018-09-03 NOTE — Telephone Encounter (Signed)
Appointment set for Friday at 4:30 patient could not  Do Wednesday.

## 2018-09-03 NOTE — Telephone Encounter (Signed)
Patient insisting she wants PCP to order MRI she says she will send my chart message explaining.

## 2018-09-04 NOTE — Telephone Encounter (Signed)
Patient is scheduled for Friday for evaluation.

## 2018-09-06 ENCOUNTER — Encounter: Payer: Self-pay | Admitting: Family Medicine

## 2018-09-06 ENCOUNTER — Other Ambulatory Visit: Payer: Self-pay

## 2018-09-06 ENCOUNTER — Ambulatory Visit (INDEPENDENT_AMBULATORY_CARE_PROVIDER_SITE_OTHER): Payer: 59 | Admitting: Family Medicine

## 2018-09-06 DIAGNOSIS — M5442 Lumbago with sciatica, left side: Secondary | ICD-10-CM

## 2018-09-06 DIAGNOSIS — E611 Iron deficiency: Secondary | ICD-10-CM | POA: Diagnosis not present

## 2018-09-06 DIAGNOSIS — M5412 Radiculopathy, cervical region: Secondary | ICD-10-CM | POA: Diagnosis not present

## 2018-09-06 DIAGNOSIS — M25552 Pain in left hip: Secondary | ICD-10-CM | POA: Diagnosis not present

## 2018-09-06 DIAGNOSIS — G8929 Other chronic pain: Secondary | ICD-10-CM | POA: Diagnosis not present

## 2018-09-06 NOTE — Progress Notes (Signed)
Virtual Visit via video Note  This visit type was conducted due to national recommendations for restrictions regarding the COVID-19 pandemic (e.g. social distancing).  This format is felt to be most appropriate for this patient at this time.  All issues noted in this document were discussed and addressed.  No physical exam was performed (except for noted visual exam findings with Video Visits).   I connected with Frederich Cha today at  4:30 PM EDT by a video enabled telemedicine application and verified that I am speaking with the correct person using two identifiers. Location patient: car, not driving Location provider: work Persons participating in the virtual visit: patient, provider, husband, kids are in the car  I discussed the limitations, risks, security and privacy concerns of performing an evaluation and management service by telephone and the availability of in person appointments. I also discussed with the patient that there may be a patient responsible charge related to this service. The patient expressed understanding and agreed to proceed.  Reason for visit: follow-up  HPI: Low back pain/hip pain/neck pain: Patient has had issues with her left hip and low back since 2015/2016.  She has seen sports medicine recurrently for this and has been treated for bursitis with injections which would help some though would not resolve her symptoms.  She does have some pain radiating down the left side of her leg on the left leg.  She notes her fourth and fifth toes on the left tingle and are numb at times.  She feels as though her left leg may be weaker than the right as she has trouble going up stairs with the leg.  No incontinence.  No saddle anesthesia.  She does report a pressure sensation in her rectum.  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  She had plain films of her left hip with no abnormality noted.  She underwent plain films of her  lumbar spine which were negative as well.  She has also reported neck pain for some time now that does radiate mostly down her left arm down the ulnar aspect.  She will have some tingling and numbness in her index and pinky fingers.  She notes prior tremors which have improved though she still occasionally has tremor in her left hand.  She did see neurology for that and had an MRI that was read as normal.  Iron deficiency: Found to be iron deficient on lab work through sports medicine.  She notes she has had iron deficiency since high school.  Sometimes she is able to donate blood and other times she is not able to donate blood.  She does not have a menstrual cycle given that she is currently on Depo-Provera.  She notes no blood in her stool.  No hematuria.  She states she was placed on the Depo-Provera because she was having significant leg pain with her menstrual cycles.   ROS: See pertinent positives and negatives per HPI.  Past Medical History:  Diagnosis Date  . Arthritis   . Chest discomfort    a. associated with palpitations.  . Depression   . Dyspnea on exertion    a. 12/2016 Echo: EF 55-60%, no rwma, nl PASP.  Marland Kitchen Juvenile rheumatoid arthritis ([place])   . Meniere disease   . Migraines   . Palpitations   . PVC's (premature ventricular contractions)    a. 01/2017 48h Holter: Rare PAC/PVC. Freq runs of sinus tachycardia.  Past Surgical History:  Procedure Laterality Date  . KNEE SURGERY  1997, 1997   torn meniscus, ACL replacement  . REFRACTIVE SURGERY     lasix    Family History  Problem Relation Age of Onset  . Diabetes Mother   . Arthritis Father   . Other Father        atrial flutter  . Emphysema Father        smoker  . Diabetes Maternal Aunt   . Diabetes Maternal Uncle   . Stroke Paternal Aunt   . Arthritis Maternal Grandmother   . Diabetes Maternal Grandmother   . Cancer Maternal Grandmother        skin  . Arthritis Paternal Grandmother   . Cancer Paternal  Grandmother        skin    SOCIAL HX: Non-smoker   Current Outpatient Medications:  .  albuterol (PROVENTIL HFA;VENTOLIN HFA) 108 (90 Base) MCG/ACT inhaler, Inhale 1-2 puffs into the lungs every 6 (six) hours as needed for wheezing or shortness of breath., Disp: 1 Inhaler, Rfl: 2 .  aspirin-acetaminophen-caffeine (EXCEDRIN MIGRAINE) 250-250-65 MG tablet, Take by mouth daily., Disp: , Rfl:  .  baclofen (LIORESAL) 10 MG tablet, Take 1 tablet (10 mg total) by mouth 3 (three) times daily., Disp: 30 each, Rfl: 0 .  doxycycline (ORACEA) 40 MG capsule, Take 40 mg by mouth every morning., Disp: , Rfl:  .  fluticasone (FLONASE) 50 MCG/ACT nasal spray, Place into the nose., Disp: , Rfl:  .  gabapentin (NEURONTIN) 100 MG capsule, Take 2 capsules (200 mg total) by mouth at bedtime., Disp: 60 capsule, Rfl: 3 .  medroxyPROGESTERone (DEPO-PROVERA) 150 MG/ML injection, Inject 1 mL (150 mg total) into the muscle every 3 (three) months., Disp: 1 mL, Rfl: 3 .  pantoprazole (PROTONIX) 40 MG tablet, Take 40 mg by mouth daily., Disp: , Rfl: 3 .  SUMAtriptan (IMITREX) 50 MG tablet, 50 mg as needed., Disp: , Rfl: 2 .  Vitamin D, Ergocalciferol, (DRISDOL) 1.25 MG (50000 UT) CAPS capsule, Take 1 capsule (50,000 Units total) by mouth every 7 (seven) days., Disp: 12 capsule, Rfl: 0  EXAM:  VITALS per patient if applicable: None.  GENERAL: alert, oriented, appears well and in no acute distress  HEENT: atraumatic, conjunttiva clear, no obvious abnormalities on inspection of external nose and ears  NECK: normal movements of the head and neck  LUNGS: on inspection no signs of respiratory distress, breathing rate appears normal, no obvious gross SOB, gasping or wheezing  CV: no obvious cyanosis  MS: moves all visible extremities without noticeable abnormality  PSYCH/NEURO: pleasant and cooperative, no obvious depression or anxiety, speech and thought processing grossly intact, slight resting tremor noted in left  hand  ASSESSMENT AND PLAN:  Discussed the following assessment and plan:  Chronic left-sided low back pain with left-sided sciatica - Plan: MR Lumbar Spine Wo Contrast  Cervical radiculopathy - Plan: MR Cervical Spine Wo Contrast  Chronic hip pain, left  Iron deficiency  Cervical radiculopathy Symptoms concerning for nerve impingement.  Will obtain an MRI.  She is given return precautions.  She will contact us if she does not hear about the MRI being scheduled within the next week.  Chronic hip pain, left Symptoms potentially could be related to her back.  Prior treatment and evaluation was unremarkable and not helpful.  We will evaluate her back and if no causes found would consider advanced imaging of her hip.  Iron deficiency Patient reports a chronic history  of this.  We will plan to recheck lab work at the time of her physical in July.  If she continues to be iron deficient we would need to consider further evaluation of this.  Low back pain Symptoms are concerning for nerve impingement.  We will obtain an MRI lumbar spine.  Given return precautions.  If she does not hear anything from [place] regarding this being scheduled within the next week she will contact us.  The patient does report she was placed on Depo-Provera as her nerve pain seemed to worsen when she had her cycle.  That has been going on for a number of years since she had her first child.  We will request records from GYN when the patient comes in for her physical.  Patient will keep her appointment for physical in July.  Social distancing precautions and sick precautions given regarding COVID-19.   I discussed the assessment and treatment plan with the patient. The patient was provided an opportunity to ask questions and all were answered. The patient agreed with the plan and demonstrated an understanding of the instructions.   The patient was advised to call back or seek an in-person evaluation if the symptoms worsen  or if the condition fails to improve as anticipated.   Tommi Rumps, MD

## 2018-09-06 NOTE — Assessment & Plan Note (Signed)
Symptoms potentially could be related to her back.  Prior treatment and evaluation was unremarkable and not helpful.  We will evaluate her back and if no causes found would consider advanced imaging of her hip.

## 2018-09-06 NOTE — Assessment & Plan Note (Signed)
Symptoms concerning for nerve impingement.  Will obtain an MRI.  She is given return precautions.  She will contact us if she does not hear about the MRI being scheduled within the next week.

## 2018-09-06 NOTE — Assessment & Plan Note (Signed)
Patient reports a chronic history of this.  We will plan to recheck lab work at the time of her physical in July.  If she continues to be iron deficient we would need to consider further evaluation of this.

## 2018-09-06 NOTE — Assessment & Plan Note (Addendum)
Symptoms are concerning for nerve impingement.  We will obtain an MRI lumbar spine.  Given return precautions.  If she does not hear anything from [place] regarding this being scheduled within the next week she will contact us.  The patient does report she was placed on Depo-Provera as her nerve pain seemed to worsen when she had her cycle.  That has been going on for a number of years since she had her first child.  We will request records from GYN when the patient comes in for her physical.

## 2018-09-12 ENCOUNTER — Telehealth: Payer: Self-pay

## 2018-09-12 NOTE — Telephone Encounter (Signed)
Spoke with the patient and she stated that she is having a MRI done on 09/25/2018. She stated that she would like to see what those results are before scheduling an appt with [place]. She was very appreciative for the call. Office number was provided.

## 2018-09-20 ENCOUNTER — Ambulatory Visit: Payer: 59

## 2018-09-25 ENCOUNTER — Ambulatory Visit
Admission: RE | Admit: 2018-09-25 | Discharge: 2018-09-25 | Disposition: A | Discharge status: Home / Self Care | Payer: 59 | Source: Ambulatory Visit | Attending: Family Medicine | Admitting: Family Medicine

## 2018-09-25 ENCOUNTER — Other Ambulatory Visit: Payer: Self-pay

## 2018-09-25 DIAGNOSIS — M545 Low back pain: Secondary | ICD-10-CM | POA: Diagnosis not present

## 2018-09-25 DIAGNOSIS — M5412 Radiculopathy, cervical region: Secondary | ICD-10-CM | POA: Insufficient documentation

## 2018-09-25 DIAGNOSIS — M542 Cervicalgia: Secondary | ICD-10-CM | POA: Diagnosis not present

## 2018-09-25 DIAGNOSIS — M5442 Lumbago with sciatica, left side: Secondary | ICD-10-CM | POA: Diagnosis not present

## 2018-09-25 DIAGNOSIS — G8929 Other chronic pain: Secondary | ICD-10-CM | POA: Diagnosis not present

## 2018-10-03 ENCOUNTER — Other Ambulatory Visit: Payer: Self-pay | Admitting: Family Medicine

## 2018-10-03 DIAGNOSIS — G8929 Other chronic pain: Secondary | ICD-10-CM

## 2018-10-03 DIAGNOSIS — M25552 Pain in left hip: Secondary | ICD-10-CM

## 2018-10-03 DIAGNOSIS — G935 Compression of brain: Secondary | ICD-10-CM

## 2018-10-03 DIAGNOSIS — M5412 Radiculopathy, cervical region: Secondary | ICD-10-CM

## 2018-10-07 ENCOUNTER — Telehealth: Payer: Self-pay | Admitting: *Deleted

## 2018-10-07 NOTE — Telephone Encounter (Signed)
Copied from [place] [phone]. Topic: Referral - Status >> Oct 04, 2018  4:12 PM Oneta Rack wrote: Patient checking on the status of neurosurgery and orthopedic surgery referral chart doesn't reflect the actually specialist contact information, patient seeking a follow up call on Monday 10/07/2018, please advise

## 2018-10-14 ENCOUNTER — Other Ambulatory Visit: Payer: Self-pay

## 2018-10-15 DIAGNOSIS — M62838 Other muscle spasm: Secondary | ICD-10-CM | POA: Diagnosis not present

## 2018-10-15 DIAGNOSIS — G935 Compression of brain: Secondary | ICD-10-CM | POA: Diagnosis not present

## 2018-10-15 DIAGNOSIS — R251 Tremor, unspecified: Secondary | ICD-10-CM | POA: Diagnosis not present

## 2018-10-15 DIAGNOSIS — M955 Acquired deformity of pelvis: Secondary | ICD-10-CM | POA: Diagnosis not present

## 2018-10-15 DIAGNOSIS — M25552 Pain in left hip: Secondary | ICD-10-CM | POA: Diagnosis not present

## 2018-10-15 DIAGNOSIS — R29898 Other symptoms and signs involving the musculoskeletal system: Secondary | ICD-10-CM | POA: Diagnosis not present

## 2018-10-16 ENCOUNTER — Ambulatory Visit (INDEPENDENT_AMBULATORY_CARE_PROVIDER_SITE_OTHER): Payer: 59 | Admitting: Family Medicine

## 2018-10-16 ENCOUNTER — Encounter: Payer: Self-pay | Admitting: Family Medicine

## 2018-10-16 ENCOUNTER — Other Ambulatory Visit: Payer: Self-pay

## 2018-10-16 VITALS — BP 110/70 | HR 74 | Temp 98.0°F | Ht 65.0 in | Wt 168.8 lb

## 2018-10-16 DIAGNOSIS — G935 Compression of brain: Secondary | ICD-10-CM

## 2018-10-16 DIAGNOSIS — Z13220 Encounter for screening for lipoid disorders: Secondary | ICD-10-CM | POA: Diagnosis not present

## 2018-10-16 DIAGNOSIS — Z0000 Encounter for general adult medical examination without abnormal findings: Secondary | ICD-10-CM | POA: Diagnosis not present

## 2018-10-16 DIAGNOSIS — E611 Iron deficiency: Secondary | ICD-10-CM

## 2018-10-16 DIAGNOSIS — N870 Mild cervical dysplasia: Secondary | ICD-10-CM | POA: Diagnosis not present

## 2018-10-16 DIAGNOSIS — E559 Vitamin D deficiency, unspecified: Secondary | ICD-10-CM

## 2018-10-16 DIAGNOSIS — Z7689 Persons encountering health services in other specified circumstances: Secondary | ICD-10-CM | POA: Insufficient documentation

## 2018-10-16 DIAGNOSIS — E663 Overweight: Secondary | ICD-10-CM | POA: Diagnosis not present

## 2018-10-16 DIAGNOSIS — Z1329 Encounter for screening for other suspected endocrine disorder: Secondary | ICD-10-CM | POA: Diagnosis not present

## 2018-10-16 NOTE — Patient Instructions (Signed)
Nice to see you.  Your lab work fasting at the hospital tomorrow. Please let us or neurosurgery know if you do not hear from neurology regarding your referral. Please keep your follow-up appointment with gynecology.

## 2018-10-16 NOTE — Assessment & Plan Note (Signed)
Physical exam completed.  She will take care of her chronic pain issues and once that has been improving she will work on increasing exercise.  She will continue to monitor her diet.  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  She will check on her tetanus vaccine date.  She will have lab work as outlined below at the hospital lab.

## 2018-10-16 NOTE — Assessment & Plan Note (Signed)
She will keep her appointment with GYN.

## 2018-10-16 NOTE — Addendum Note (Signed)
Addended by: Caryl Bis, ERIC G on: 10/16/2018 12:22 PM   Modules accepted: Orders

## 2018-10-16 NOTE — Assessment & Plan Note (Signed)
Check iron levels

## 2018-10-16 NOTE — Assessment & Plan Note (Addendum)
She has seen neurosurgery.  No indication for surgery.  She will see neurology for her variety of symptoms.  If she does not hear from them she will let us or neurosurgery know.

## 2018-10-16 NOTE — Progress Notes (Addendum)
Tommi Rumps, MD Phone: [phone]  Sarah Gallagher is a 38 y.o. female who presents today for CPE.  Exercise: She is not able to do much exercise related to her chronic pain.  She has seen neurosurgery and they have referred her to neurology.  They did not feel that the Chiari I malformation would be contributing to her symptoms.  She also saw orthopedics and they referred her for pelvic therapy. Diet is relatively healthy at breakfast and lunch though does eat some unhealthy things at dinner. Pap smear 10/23/2017 with LGSIL.  She underwent colposcopy with a CIN-1 result in October.  They recommended repeat in 1 year. She does not have menstrual cycle given that she is on Depo-Provera. She is sexually active with one partner who is her husband. No family history of breast cancer, ovarian cancer, or colon cancer. She has received a tetanus vaccine through employee health and she will check on the date of that. No tobacco use or illicit drug use.  She drinks about 2 beers a month. She sees a Pharmacist, community twice yearly.  Ophthalmologist every 1 to 2 years.  Active Ambulatory Problems    Diagnosis Date Noted  . Dyspnea on exertion 12/18/2016  . Lower abdominal pain 12/18/2016  . Myalgia 12/18/2016  . Palpitations 12/18/2016  . Headache 12/18/2016  . History of occupational exposure to risk factor 12/18/2016  . Greater trochanteric bursitis of left hip 01/31/2017  . Low back pain 01/31/2017  . Nonallopathic lesion of lumbosacral region 01/31/2017  . Nonallopathic lesion of sacral region 01/31/2017  . Nonallopathic lesion of thoracic region 01/31/2017  . Anxiety and depression 11/27/2017  . Gluteal tendinitis of left buttock 04/24/2018  . Cervical radiculopathy 09/06/2018  . Chronic hip pain, left 09/06/2018  . Iron deficiency 09/06/2018  . Routine general medical examination at a health care facility 10/16/2018  . Dysplasia of cervix, low grade (CIN 1) 10/16/2018  . Chiari I  malformation ([place]) 10/16/2018   Resolved Ambulatory Problems    Diagnosis Date Noted  . No Resolved Ambulatory Problems   Past Medical History:  Diagnosis Date  . Arthritis   . Chest discomfort   . Depression   . Juvenile rheumatoid arthritis ([place])   . Meniere disease   . Migraines   . PVC's (premature ventricular contractions)     Family History  Problem Relation Age of Onset  . Diabetes Mother   . Arthritis Father   . Other Father        atrial flutter  . Emphysema Father        smoker  . Diabetes Maternal Aunt   . Diabetes Maternal Uncle   . Stroke Paternal Aunt   . Arthritis Maternal Grandmother   . Diabetes Maternal Grandmother   . Cancer Maternal Grandmother        skin  . Arthritis Paternal Grandmother   . Cancer Paternal Grandmother        skin    Social History   Socioeconomic History  . Marital status: Married    Spouse name: Not on file  . Number of children: 3  . Years of education: BS  . Highest education level: Not on file  Occupational History    Comment: [employer], Resp Therapist  Social Needs  . Financial resource strain: Not on file  . Food insecurity    Worry: Not on file    Inability: Not on file  . Transportation needs    Medical: Not on  file    Non-medical: Not on file  Tobacco Use  . Smoking status: Never Smoker  . Smokeless tobacco: Never Used  Substance and Sexual Activity  . Alcohol use: Yes    Alcohol/week: 0.0 standard drinks    Comment: rare  . Drug use: No  . Sexual activity: Yes    Birth control/protection: Injection  Lifestyle  . Physical activity    Days per week: Not on file    Minutes per session: Not on file  . Stress: Not on file  Relationships  . Social Herbalist on phone: Not on file    Gets together: Not on file    Attends religious service: Not on file    Active member of club or organization: Not on file    Attends meetings of clubs or organizations: Not on file     Relationship status: Not on file  . Intimate partner violence    Fear of current or ex partner: Not on file    Emotionally abused: Not on file    Physically abused: Not on file    Forced sexual activity: Not on file  Other Topics Concern  . Not on file  Social History Narrative   Lives in [place] with husband and children.  Not currently exercising.  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   Caffeine 2-3 cups daily    ROS  General:  Negative for nexplained weight loss, fever Skin: Negative for new or changing mole, sore that won't heal HEENT: Negative for trouble hearing, trouble seeing, ringing in ears, mouth sores, hoarseness, change in voice, dysphagia. CV:  Negative for chest pain, dyspnea, edema, palpitations Resp: Negative for cough, dyspnea, hemoptysis GI: Negative for nausea, vomiting, diarrhea, constipation, abdominal pain, melena, hematochezia. GU: Negative for dysuria, incontinence, urinary hesitance, hematuria, vaginal or penile discharge, polyuria, sexual difficulty, lumps in testicle or breasts MSK: Negative for muscle cramps or aches, joint pain or swelling Neuro: Negative for headaches, weakness, numbness, dizziness, passing out/fainting Psych: Negative for depression, anxiety, memory problems  Objective  Physical Exam Vitals:   10/16/18 1129  BP: 110/70  Pulse: 74  Temp: 98 F (36.7 C)  SpO2: 98%    BP Readings from Last 3 Encounters:  10/16/18 110/70  07/16/18 102/62  05/20/18 102/64   Wt Readings from Last 3 Encounters:  10/16/18 168 lb 12.8 oz (76.6 kg)  07/16/18 168 lb (76.2 kg)  05/20/18 172 lb (78 kg)    Physical Exam Constitutional:      General: She is not in acute distress.    Appearance: She is not diaphoretic.  HENT:     Head: Normocephalic and atraumatic.     Mouth/Throat:     Mouth: Mucous membranes are moist.     Pharynx: Oropharynx is clear.  Eyes:     Conjunctiva/sclera: Conjunctivae normal.     Pupils: Pupils are equal, round, and reactive to  light.  Cardiovascular:     Rate and Rhythm: Normal rate and regular rhythm.     Heart sounds: Normal heart sounds.  Pulmonary:     Effort: Pulmonary effort is normal.     Breath sounds: Normal breath sounds.  Abdominal:     General: Bowel sounds are normal. There is no distension.     Palpations: Abdomen is soft. There is no mass.     Tenderness: There is no abdominal tenderness. There is no guarding or rebound.  Musculoskeletal:     Right lower leg: No edema.  Left lower leg: No edema.  Lymphadenopathy:     Cervical: No cervical adenopathy.  Skin:    General: Skin is warm and dry.  Neurological:     Mental Status: She is alert.  Psychiatric:        Mood and Affect: Mood normal.      Assessment/Plan:   Routine general medical examination at a health care facility Physical exam completed.  She will take care of her chronic pain issues and once that has been improving she will work on increasing exercise.  She will continue to monitor her diet.  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  She will check on her tetanus vaccine date.  She will have lab work as outlined below at the hospital lab.  Dysplasia of cervix, low grade (CIN 1) She will keep her appointment with GYN.  Chiari I malformation ([place]) She has seen neurosurgery.  No indication for surgery.  She will see neurology for her variety of symptoms.  If she does not hear from them she will let us or neurosurgery know.  Iron deficiency Check iron levels.   Orders Placed This Encounter  Procedures  . Comprehensive metabolic panel    Standing Status:   Future    Standing Expiration Date:   10/16/2019  . CBC    Standing Status:   Future    Standing Expiration Date:   10/16/2019  . TSH    Standing Status:   Future    Standing Expiration Date:   10/16/2019  . Lipid panel    Standing Status:   Future    Standing Expiration Date:   10/16/2019  . Hemoglobin A1c    Standing Status:    Future    Standing Expiration Date:   10/16/2019  . Ferritin    Standing Status:   Future    Standing Expiration Date:   10/16/2019  . Iron and TIBC    Standing Status:   Future    Standing Expiration Date:   10/16/2019  . Vitamin D (25 hydroxy)    Standing Status:   Future    Standing Expiration Date:   10/16/2019    No orders of the defined types were placed in this encounter.    Tommi Rumps, MD [place]

## 2018-10-18 ENCOUNTER — Other Ambulatory Visit
Admission: RE | Admit: 2018-10-18 | Discharge: 2018-10-18 | Disposition: A | Discharge status: Home / Self Care | Payer: 59 | Source: Ambulatory Visit | Attending: Family Medicine | Admitting: Family Medicine

## 2018-10-18 ENCOUNTER — Other Ambulatory Visit: Payer: Self-pay

## 2018-10-18 DIAGNOSIS — E663 Overweight: Secondary | ICD-10-CM | POA: Insufficient documentation

## 2018-10-18 DIAGNOSIS — E559 Vitamin D deficiency, unspecified: Secondary | ICD-10-CM | POA: Diagnosis not present

## 2018-10-18 DIAGNOSIS — E611 Iron deficiency: Secondary | ICD-10-CM | POA: Diagnosis not present

## 2018-10-18 DIAGNOSIS — Z13220 Encounter for screening for lipoid disorders: Secondary | ICD-10-CM | POA: Diagnosis not present

## 2018-10-18 DIAGNOSIS — Z1329 Encounter for screening for other suspected endocrine disorder: Secondary | ICD-10-CM | POA: Insufficient documentation

## 2018-10-18 LAB — COMPREHENSIVE METABOLIC PANEL
ALT: 20 U/L (ref 0–44)
AST: 19 U/L (ref 15–41)
Albumin: 4.3 g/dL (ref 3.5–5.0)
Alkaline Phosphatase: 47 U/L (ref 38–126)
Anion gap: 8 (ref 5–15)
BUN: 15 mg/dL (ref 6–20)
CO2: 23 mmol/L (ref 22–32)
Calcium: 9.2 mg/dL (ref 8.9–10.3)
Chloride: 111 mmol/L (ref 98–111)
Creatinine, Ser: 0.86 mg/dL (ref 0.44–1.00)
GFR calc Af Amer: >60 mL/min (ref >60)
GFR calc non Af Amer: >60 mL/min (ref >60)
Glucose, Bld: 100 mg/dL — ABNORMAL HIGH (ref 70–99)
Potassium: 4 mmol/L (ref 3.5–5.1)
Sodium: 142 mmol/L (ref 135–145)
Total Bilirubin: 0.8 mg/dL (ref 0.3–1.2)
Total Protein: 7.4 g/dL (ref 6.5–8.1)

## 2018-10-18 LAB — IRON AND TIBC
Iron: 93 ug/dL (ref 28–170)
Saturation Ratios: 37 % — ABNORMAL HIGH (ref 10.4–31.8)
TIBC: 254 ug/dL (ref 250–450)
UIBC: 161 ug/dL

## 2018-10-18 LAB — CBC
HCT: 41.9 % (ref 36.0–46.0)
Hemoglobin: 13.6 g/dL (ref 12.0–15.0)
MCH: 30.4 pg (ref 26.0–34.0)
MCHC: 32.5 g/dL (ref 30.0–36.0)
MCV: 93.7 fL (ref 80.0–100.0)
Platelets: 206 10*3/uL (ref 150–400)
RBC: 4.47 MIL/uL (ref 3.87–5.11)
RDW: 13.2 % (ref 11.5–15.5)
WBC: 5.9 10*3/uL (ref 4.0–10.5)
nRBC: 0 % (ref 0.0–0.2)

## 2018-10-18 LAB — LIPID PANEL
Cholesterol: 142 mg/dL (ref 0–200)
HDL: 39 mg/dL — ABNORMAL LOW (ref >40)
LDL Cholesterol: 95 mg/dL (ref 0–99)
Total CHOL/HDL Ratio: 3.6 RATIO
Triglycerides: 42 mg/dL (ref <150)
VLDL: 8 mg/dL (ref 0–40)

## 2018-10-18 LAB — HEMOGLOBIN A1C
Hgb A1c MFr Bld: 5.4 % (ref 4.8–5.6)
Mean Plasma Glucose: 108.28 mg/dL

## 2018-10-18 LAB — FERRITIN: Ferritin: 87 ng/mL (ref 11–307)

## 2018-10-18 LAB — TSH: TSH: 1.639 u[IU]/mL (ref 0.350–4.500)

## 2018-10-19 LAB — VITAMIN D 25 HYDROXY (VIT D DEFICIENCY, FRACTURES): Vit D, 25-Hydroxy: 32.5 ng/mL (ref 30.0–100.0)

## 2018-11-05 ENCOUNTER — Encounter: Payer: Self-pay | Admitting: Physical Therapy

## 2018-11-05 ENCOUNTER — Other Ambulatory Visit: Payer: Self-pay

## 2018-11-05 ENCOUNTER — Ambulatory Visit: Payer: 59 | Attending: Sports Medicine | Admitting: Physical Therapy

## 2018-11-05 DIAGNOSIS — M6281 Muscle weakness (generalized): Secondary | ICD-10-CM

## 2018-11-05 DIAGNOSIS — M4125 Other idiopathic scoliosis, thoracolumbar region: Secondary | ICD-10-CM

## 2018-11-05 DIAGNOSIS — R278 Other lack of coordination: Secondary | ICD-10-CM

## 2018-11-05 DIAGNOSIS — M25552 Pain in left hip: Secondary | ICD-10-CM | POA: Insufficient documentation

## 2018-11-05 DIAGNOSIS — M546 Pain in thoracic spine: Secondary | ICD-10-CM | POA: Insufficient documentation

## 2018-11-05 DIAGNOSIS — M6208 Separation of muscle (nontraumatic), other site: Secondary | ICD-10-CM | POA: Diagnosis not present

## 2018-11-05 NOTE — Patient Instructions (Signed)
Scoliosis stretches:    R sidelying only   open book ( handout) 15 reps   ___    Countertop stretch, lengthen spine  L hand on R thigh   Look over ( modified thread the needle )    10 reps

## 2018-11-07 NOTE — Therapy (Addendum)
[place] MAIN [facility] SERVICES [address] [place], [place], [address] Phone: [phone]   Fax:  [phone]  Physical Therapy Evaluation  Patient Details  Name: Sarah Gallagher MRN: [id number] Date of Birth: 1980-05-02 Referring Provider (PT): Candelaria Stagers    Encounter Date: 11/05/2018    Past Medical History:  Diagnosis Date  . Arthritis   . Chest discomfort    a. associated with palpitations.  . Depression   . Dyspnea on exertion    a. 12/2016 Echo: EF 55-60%, no rwma, nl PASP.  Marland Kitchen Juvenile rheumatoid arthritis ([place])   . Meniere disease   . Migraines   . Palpitations   . PVC's (premature ventricular contractions)    a. 01/2017 48h Holter: Rare PAC/PVC. Freq runs of sinus tachycardia.    Past Surgical History:  Procedure Laterality Date  . KNEE SURGERY Right 1997, 1997   torn meniscus, ACL replacement    . REFRACTIVE SURGERY     lasix    There were no vitals filed for this visit.   Subjective Assessment - 11/11/18 1052    Subjective 1)  L hip pain for the past 5 years with pt was running alot. Pt had just staerted commiting to a workout routine of running weights, core exercises without flexibility  2015. 5 days a week. Pt kept it for 3 years.  Pt kept up with using 50lb free weights with overhead , shoulders shrugs, radiating down lateral to the foot, posterior thigh to the top of knee. Pt quit running and sought a sports medicine doctor who Dx her with hip bursitis. It helped by 60%.  The remaining location of pain is at greater trochanter, over iliac crest but inside, QL mm.  Pain 4-5/10 [place].  Radiating pain occurs up waking but does not interrupt her at night. Pt sleeps well. Bending over exacerbates the pain. Pt has to brace herself when sitting down. Pt used to lifting weights machines back up to 200 lbs, free weights 50lb dumbbells in each hand,  lunges, sit ups, crunches, squats.   Pt has quit working the past 6 months due to  triggers of L hip pain with exercise routine.      2)  L abdomen pain within the past 6 months without injury. It feels like round ligament pain during pregnancy. Occurs when rolling in bed. Typically lay on her L side    3)  Cervical radiculopathy within 6 months without injury along anterior deltoid to last two fingers blaterally. Randomly occurs. Pt sleeps on two pillows.   4)  L > R thoracic pain randomly occurs. Pt completed PT Nov 2019 to April 2020 and felt 25% better.      Pertinent History  As a teenager, pt had multiple falls off horse without Fx, Pt was also gynmast, volleyball, basketball, softball. June 2020 x of Chiari Malformation ( 7 mm)  at level of cerebellum/ foramen magnus and a neuro consult for a tremor in both hands and some in both legs.  Pending neuro consult with Dr. Manuella Ghazi and pt was also recommended to undergo a nerve conduction test .  Hx of 3 vaginal deliveries , episiotomies, perineal tear, 4 finger width diastasis.   Denied SUI. Some pain with intercourse. Regular bowel movements.    Limitations  Standing;Walking    Patient Stated Goals  Pt would like to run again or cycle. and Lifting weights back up to 50 lbs  [facility] PT Assessment - 11/11/18 1052      Assessment   Medical Diagnosis  Pelvic obliquity , L hip pain     Referring Provider (PT)  Kubinski       Precautions   Precautions  None      Restrictions   Weight Bearing Restrictions  No      Balance Screen   Has the patient fallen in the past 6 months  No      Observation/Other Assessments   Observations  L ASIS to medial malleoli 90 cm, R 89 cm, Post Tx: 89 cm B.  sitting with hands propped on plinth, leaning back, spinal extension withhands posterior to hips supported on chair, pain at scapula, tremor       Scoliosis  R throacic/ L lumbar convex curves, L iliac crest higher than R       Functional Tests   Functional tests  --   plan to assess next session     Palpation   SI assessment    increased glut med tightness at iliac crest area L, QL mm tightness L, L hypomobile                 Objective measurements completed on examination: See above findings.    Pelvic Floor Special Questions - 11/11/18 1055    Diastasis Recti  4 fingers width linea alba        [facility] Adult PT Treatment/Exercise - 11/11/18 1054      Therapeutic Activites    Therapeutic Activities  --   explained scoliosis, etiology of Sx, POC with fitness modifi     Manual Therapy   Manual therapy comments  rotational mob, PA mob at L sacrum to increase nutation and realign ilia                   PT Long Term Goals - 11/11/18 1106      PT LONG TERM GOAL #1   Title  Pt will demo decreased mm imbalances along spine and no hypomobility of SIJ in order to proggress to deepcore strengthening exercises    Time  2    Period  Weeks    Status  New      PT LONG TERM GOAL #2   Title  Pt will demo IND with scoliosis-specific routine in order to progress to bilateral weight /body weight strengthening with less risk for injuries    Time  4    Period  Weeks    Status  New      PT LONG TERM GOAL #3   Title  Pt will demo decreased abdominal separation from 4 fingers width to < 2 fingers width in order to increase intraabdominal pressure system to bend and to wake up without pain    Time  6    Period  Weeks    Status  New      PT LONG TERM GOAL #4   Title  Pt will report decreased NDI score from % to < % to return to weight lifting of dumbbells/ resistance bands with minimized risk for injuries    Time  10    Period  Weeks    Status  New      PT LONG TERM GOAL #5   Title  Pt will report decreased ODI score from % to < % in order to perform squat/ lunges, and other against gravity tasks and positions in workout    Time  10  Period  Weeks      PT LONG TERM GOAL #6   Title  Pt will demo proper co-activaiton of lower kinetic chain, deep core system while marching on trampoline 5'  without pain in order to progress towards jogging on soft ground    Time  8    Period  Weeks    Status  New             Plan - 11/11/18 1055    Clinical Impression Statement Pt is a 38 yo female who reports of L hip pain and L abdominal pain that started 5-6 years ago.  Pt also reports cervical radiating pain, thoracic pain ( L > R) pain. These Sx have limited her ability to perform her physical workout routine, bending, pain upon waking but does not interrupt her during her sleep, running.    Pt presents with 4 fingers width diastasis recti, sacroiliac joint and spinal hypomobility, scoliosis, poor body mechanics which places strain on the abdominal/pelvic floor mm. These are deficits that indicate an ineffective intraabdominal pressure system associated w/ her Sx.   Contributing factors to her Sx include starting a rigorous workout routine 6 years ago without proper deep core system intact. Routine included running and  core exercises without a stretching routine 2015 for 5 days a week. Pt kept it for 3 years. Pt gradually included lifting weights machines back up to 200 lbs, free weights 50lb dumbbells in each hand,  lunges, sit ups, crunches, squats.  Hx of 3 vaginal deliveries , episiotomies, perineal tear, 4 finger width diastasis.   Pt was provided education on etiology of Sx with anatomy, physiology explanation with images along with the benefits of customized pelvic PT Tx based on pt's medical conditions and musculoskeletal deficits.  Explained the physiology of deep core mm coordination and roles of pelvic floor function in urination, defecation, sexual function, and postural control with deep core mm system.   Following Tx today which pt tolerated without complaints, pt demo'd equal alignment of pelvic girdle and increased spinal mobility.   Precaution to consider: June 2020 x of Chiari Malformation ( 7 mm)  at level of cerebellum/ foramen magnus and a neuro consult for a tremor  in both hands and some in both legs.       Personal Factors and Comorbidities  Age;Comorbidity 3+    Comorbidities  See medical history    Examination-Activity Limitations  Squat;Lift    Stability/Clinical Decision Making  Evolving/Moderate complexity    Rehab Potential  Good    PT Frequency  1x / week    PT Duration  Other (comment)   10   PT Treatment/Interventions  Moist Heat;Neuromuscular re-education;Gait training;Patient/family education;Therapeutic activities;Therapeutic exercise;Manual techniques;Taping;Balance training;Scar mobilization;Traction;Functional mobility training    Consulted and Agree with Plan of Care  Patient       Patient will benefit from skilled therapeutic intervention in order to improve the following deficits and impairments:  Pain, Improper body mechanics, Increased muscle spasms, Postural dysfunction, Decreased mobility, Decreased coordination, Decreased endurance, Decreased range of motion, Hypomobility, Difficulty walking, Decreased balance, Decreased safety awareness, Hypermobility, Decreased activity tolerance  Visit Diagnosis: 1. Other idiopathic scoliosis, thoracolumbar region   2. Muscle weakness (generalized)   3. Diastasis recti   4. Other lack of coordination        Problem List Patient Active Problem List   Diagnosis Date Noted  . Routine general medical examination at a health care facility 10/16/2018  . Dysplasia of cervix, low  grade (CIN 1) 10/16/2018  . Chiari I malformation ([place]) 10/16/2018  . Cervical radiculopathy 09/06/2018  . Chronic hip pain, left 09/06/2018  . Iron deficiency 09/06/2018  . Gluteal tendinitis of left buttock 04/24/2018  . Anxiety and depression 11/27/2017  . Greater trochanteric bursitis of left hip 01/31/2017  . Low back pain 01/31/2017  . Nonallopathic lesion of lumbosacral region 01/31/2017  . Nonallopathic lesion of sacral region 01/31/2017  . Nonallopathic lesion of thoracic region 01/31/2017  .  Dyspnea on exertion 12/18/2016  . Lower abdominal pain 12/18/2016  . Myalgia 12/18/2016  . Palpitations 12/18/2016  . Headache 12/18/2016  . History of occupational exposure to risk factor 12/18/2016    Jerl Mina ,PT, DPT, E-RYT  11/11/2018, 12:11 PM  [place] MAIN [facility] SERVICES [address] [place], [place], [address] Phone: [phone]   Fax:  [phone]  Name: Sarah Gallagher MRN: [id number] Date of Birth: [birth year]

## 2018-11-11 NOTE — Addendum Note (Signed)
Addended by: Jerl Mina on: 11/11/2018 11:38 AM   Modules accepted: Orders

## 2018-11-11 NOTE — Addendum Note (Signed)
Addended by: Jerl Mina on: 11/11/2018 12:31 PM   Modules accepted: Orders

## 2018-11-13 ENCOUNTER — Encounter: Payer: 59 | Admitting: Family Medicine

## 2018-11-14 ENCOUNTER — Encounter: Payer: 59 | Admitting: Physical Therapy

## 2018-11-18 ENCOUNTER — Other Ambulatory Visit: Payer: Self-pay

## 2018-11-18 ENCOUNTER — Ambulatory Visit: Payer: 59 | Admitting: Physical Therapy

## 2018-11-18 DIAGNOSIS — R278 Other lack of coordination: Secondary | ICD-10-CM

## 2018-11-18 DIAGNOSIS — M6208 Separation of muscle (nontraumatic), other site: Secondary | ICD-10-CM

## 2018-11-18 DIAGNOSIS — M4125 Other idiopathic scoliosis, thoracolumbar region: Secondary | ICD-10-CM

## 2018-11-18 DIAGNOSIS — M546 Pain in thoracic spine: Secondary | ICD-10-CM

## 2018-11-18 DIAGNOSIS — M6281 Muscle weakness (generalized): Secondary | ICD-10-CM

## 2018-11-18 DIAGNOSIS — M25552 Pain in left hip: Secondary | ICD-10-CM | POA: Diagnosis not present

## 2018-11-18 NOTE — Therapy (Signed)
[place] MAIN [facility] SERVICES [address] [place], [place], [address] Phone: [phone]   Fax:  [phone]  Physical Therapy Treatment  Patient Details  Name: Sarah Gallagher MRN: [id number] Date of Birth: 28-Nov-1980 Referring Provider (PT): Candelaria Stagers    Encounter Date: 11/18/2018  PT End of Session - 11/18/18 1610    Visit Number  2    Number of Visits  10    Date for PT Re-Evaluation  01/14/19    PT Start Time  1610    PT Stop Time  1708    PT Time Calculation (min)  58 min    Activity Tolerance  Patient tolerated treatment well    Behavior During Therapy  [facility] for tasks assessed/performed       Past Medical History:  Diagnosis Date  . Arthritis   . Chest discomfort    a. associated with palpitations.  . Depression   . Dyspnea on exertion    a. 12/2016 Echo: EF 55-60%, no rwma, nl PASP.  Marland Kitchen Juvenile rheumatoid arthritis ([place])   . Meniere disease   . Migraines   . Palpitations   . PVC's (premature ventricular contractions)    a. 01/2017 48h Holter: Rare PAC/PVC. Freq runs of sinus tachycardia.    Past Surgical History:  Procedure Laterality Date  . KNEE SURGERY Right 1997, 1997   torn meniscus, ACL replacement    . REFRACTIVE SURGERY     lasix    There were no vitals filed for this visit.  Subjective Assessment - 11/18/18 1610    Subjective  Pt reported no change after last session. Pt's work activities changed and she did not have to wear a lead vest last week. Typically she wears it 3 x week for 2 horus a day. Pt has to stand and turn 80 deg and reaching within that period.    Pertinent History  As a teenager, pt had multiple falls off horse without Fx, Pt was also gynmast, volleyball, basketball, softball. June 2020 x of Chiari Malformation ( 7 mm)  at level of cerebellum/ foramen magnus and a neuro consult for a tremor in both hands and some in both legs.  Pending neuro consult with Dr. Manuella Ghazi and pt was also recommended  to undergo a nerve conduction test .  Hx of 3 vaginal deliveries , episiotomies, perineal tear, 4 finger width diastasis.   Denied SUI. Some pain with intercourse. Regular bowel movements.    Limitations  Standing;Walking    Patient Stated Goals  Pt would like to run again or cycle. and Lifting weights back up to 50 lbs         [facility] PT Assessment - 11/18/18 1616      Coordination   Gross Motor Movements are Fluid and Coordinated  --   excessive cues for oberuse of oblique w/ exhalation    Fine Motor Movements are Fluid and Coordinated  --   no pelvic floor motion w/ exhalation due to oblique overuse     Palpation   SI assessment   ilia aligned, PSIS levelled                 Pelvic Floor Special Questions - 11/18/18 1619    Diastasis Recti  3 fingers width below sternum, below umbilicus, 1 knuckle depth both areas, 1 fingers width with head lift  ( post Tx: less depth of knuckle , closure below umbilicus  [facility] Adult PT Treatment/Exercise - 11/18/18 1658      Neuro Re-ed    Neuro Re-ed Details   tactile/ verbal cues for proper deep core coordination with less overuse of oblique mm       Modalities   Modalities  Moist Heat      Moist Heat Therapy   Number Minutes Moist Heat  5 Minutes    Moist Heat Location  Lumbar Spine   pt report of feeling cold     Manual Therapy   Manual therapy comments  quadriped with trunk rotation: modified due to complaint of L LBP.       Kinesiotex  Facilitate Muscle   approximation of rectus to correct DRA  (x pattern)                  PT Long Term Goals - 11/11/18 1106      PT LONG TERM GOAL #1   Title  Pt will demo decreased mm imbalances along spine and no hypomobility of SIJ in order to proggress to deepcore strengthening exercises    Time  2    Period  Weeks    Status  New      PT LONG TERM GOAL #2   Title  Pt will demo IND with scoliosis-specific routine in order to progress to bilateral weight /body  weight strengthening with less risk for injuries    Time  4    Period  Weeks    Status  New      PT LONG TERM GOAL #3   Title  Pt will demo decreased abdominal separation from 4 fingers width to < 2 fingers width in order to increase intraabdominal pressure system to bend and to wake up without pain    Time  6    Period  Weeks    Status  New      PT LONG TERM GOAL #4   Title  Pt will report decreased NDI score from % to < % to return to weight lifting of dumbbells/ resistance bands with minimized risk for injuries    Time  10    Period  Weeks    Status  New      PT LONG TERM GOAL #5   Title  Pt will report decreased ODI score from % to < % in order to perform squat/ lunges, and other against gravity tasks and positions in workout    Time  10    Period  Weeks      PT LONG TERM GOAL #6   Title  Pt will demo proper co-activaiton of lower kinetic chain, deep core system while marching on trampoline 5' without pain in order to progress towards jogging on soft ground    Time  8    Period  Weeks    Status  New            Plan - 11/18/18 1724    Clinical Impression Statement  Pt demo'd good carry over with equal alignment of pelvis and decreased tightness along L lumbar spine and with this equal alignment of pelvis, pt' demo'd decreased abdominal separation from 4 fingers width to 3 fingers width.  Addressed diastasis recti further with manual Tx. Modified Tx technique to decrease pt's complaint of L lumbar and shoulder pain. Pt demo'd more approximation of rectus mm following Tx. Applied K-tape to promote more closure above umbilicus and progressed pt to deep core level 1 and 2. pt required excessive cues  to minimzie overuse of obliques. Pt demo'd proper technique post Tx. 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   Pt continues to benefit from skilled PT.    Personal Factors and Comorbidities  Age;Comorbidity 3+    Comorbidities  See medical history    Examination-Activity Limitations  Squat;Lift    Stability/Clinical Decision Making  Evolving/Moderate complexity    Rehab Potential  Good    PT Frequency  1x / week    PT Duration  Other (comment)   10   PT Treatment/Interventions  Moist Heat;Neuromuscular re-education;Gait training;Patient/family education;Therapeutic activities;Therapeutic exercise;Manual techniques;Taping;Balance training;Scar mobilization;Traction;Functional mobility training    Consulted and Agree with Plan of Care  Patient       Patient will benefit from skilled therapeutic intervention in order to improve the following deficits and impairments:  Pain, Improper body mechanics, Increased muscle spasms, Postural dysfunction, Decreased mobility, Decreased coordination, Decreased endurance, Decreased range of motion, Hypomobility, Difficulty walking, Decreased balance, Decreased safety awareness, Hypermobility, Decreased activity tolerance  Visit Diagnosis: 1. Muscle weakness (generalized)   2. Diastasis recti   3. Other lack of coordination   4. Pain in left hip   5. Pain in thoracic spine   6. Other idiopathic scoliosis, thoracolumbar region        Problem List Patient Active Problem List   Diagnosis Date Noted  . Routine general medical examination at a health care facility 10/16/2018  . Dysplasia of cervix, low grade (CIN 1) 10/16/2018  . Chiari I malformation ([place]) 10/16/2018  . Cervical radiculopathy 09/06/2018  . Chronic hip pain, left 09/06/2018  . Iron deficiency 09/06/2018  . Gluteal tendinitis of left buttock 04/24/2018  . Anxiety and depression 11/27/2017  . Greater trochanteric bursitis of left hip 01/31/2017  . Low back pain 01/31/2017  . Nonallopathic lesion of lumbosacral region 01/31/2017  . Nonallopathic lesion of  sacral region 01/31/2017  . Nonallopathic lesion of thoracic region 01/31/2017  . Dyspnea on exertion 12/18/2016  . Lower abdominal pain 12/18/2016  . Myalgia 12/18/2016  . Palpitations 12/18/2016  . Headache 12/18/2016  . History of occupational exposure to risk factor 12/18/2016    Jerl Mina ,PT, DPT, E-RYT  11/18/2018, 5:32 PM  [place] MAIN [facility] SERVICES [address] [place], [place], [address] Phone: [phone]   Fax:  [phone]  Name: Sarah Gallagher MRN: [id number] Date of Birth: 10-01-1980

## 2018-11-18 NOTE — Patient Instructions (Signed)
Deep core level 1 and 2 ( handout) 

## 2018-11-25 ENCOUNTER — Ambulatory Visit: Payer: 59 | Admitting: Physical Therapy

## 2018-11-25 ENCOUNTER — Other Ambulatory Visit: Payer: Self-pay

## 2018-11-25 DIAGNOSIS — M546 Pain in thoracic spine: Secondary | ICD-10-CM | POA: Diagnosis not present

## 2018-11-25 DIAGNOSIS — M25552 Pain in left hip: Secondary | ICD-10-CM

## 2018-11-25 DIAGNOSIS — R278 Other lack of coordination: Secondary | ICD-10-CM

## 2018-11-25 DIAGNOSIS — M4125 Other idiopathic scoliosis, thoracolumbar region: Secondary | ICD-10-CM

## 2018-11-25 DIAGNOSIS — M6281 Muscle weakness (generalized): Secondary | ICD-10-CM

## 2018-11-25 DIAGNOSIS — M6208 Separation of muscle (nontraumatic), other site: Secondary | ICD-10-CM | POA: Diagnosis not present

## 2018-11-25 NOTE — Patient Instructions (Signed)
Wear shoe lift in R  ___  Walking with higher thighs, center of mass over ballmounds   ___  Heel raises with one hand on wall on side 20reps each  R heel lift slightly lower before rolling ankle    ___  Body mechanics principle at work:  Minimize repeated L twisting spine with feet planted  -use pivoting on ballmounds   Bending over patient:  -use mini squat ski track stance   Overhead tasks: - ski track stance ( draw my tent)   Unlock knees -

## 2018-11-26 NOTE — Therapy (Signed)
[place] MAIN [facility] SERVICES [address] [place], [place], [address] Phone: [phone]   Fax:  [phone]  Physical Therapy Treatment  Patient Details  Name: Sarah Gallagher MRN: [id number] Date of Birth: January 24, 1981 Referring Provider (PT): Candelaria Stagers    Encounter Date: 11/25/2018  PT End of Session - 11/26/18 0955    Visit Number  3    Number of Visits  10    Date for PT Re-Evaluation  01/14/19    PT Start Time  1603    PT Stop Time  1700    PT Time Calculation (min)  57 min    Activity Tolerance  Patient tolerated treatment well    Behavior During Therapy  [facility] for tasks assessed/performed       Past Medical History:  Diagnosis Date  . Arthritis   . Chest discomfort    a. associated with palpitations.  . Depression   . Dyspnea on exertion    a. 12/2016 Echo: EF 55-60%, no rwma, nl PASP.  Marland Kitchen Juvenile rheumatoid arthritis ([place])   . Meniere disease   . Migraines   . Palpitations   . PVC's (premature ventricular contractions)    a. 01/2017 48h Holter: Rare PAC/PVC. Freq runs of sinus tachycardia.    Past Surgical History:  Procedure Laterality Date  . KNEE SURGERY Right 1997, 1997   torn meniscus, ACL replacement    . REFRACTIVE SURGERY     lasix    There were no vitals filed for this visit.  Subjective Assessment - 11/25/18 1608    Subjective  Pt reported her upper back feels better . The low back is still really tight. Pt typically stands on her L leg.  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    Pertinent History  As a teenager, pt had multiple falls off horse without Fx, Pt was also gynmast, volleyball, basketball, softball. June 2020 x of Chiari Malformation ( 7 mm)  at level of cerebellum/ foramen magnus and a neuro consult for a tremor in both hands and some in both legs.  Pending neuro consult with Dr. Manuella Ghazi and pt was also recommended to undergo a nerve conduction test .  Hx of  3 vaginal deliveries , episiotomies, perineal tear, 4 finger width diastasis.   Denied SUI. Some pain with intercourse. Regular bowel movements.    Limitations  Standing;Walking    Patient Stated Goals  Pt would like to run again or cycle. and Lifting weights back up to 50 lbs         [facility] PT Assessment - 11/26/18 [id number]      Observation/Other Assessments   Observations  patella on R lower than L , scar on R from knee surgeries      Posture/Postural Control   Posture Comments  hyperextension of knees       Palpation   SI assessment   ilia aligned, PSIS levelled standing and supine      Ambulation/Gait   Gait Pattern  --   posterior COM, heel strike, short stride   Gait Comments  hip hike on R ( preTx wihtout shoe lift on R)  no hip hike on R ( post Tx with shoe lift on R)                 Pelvic Floor Special Questions - 11/26/18 0956    Diastasis Recti  no knuckles depth, complete closure with head  lift along linea alba         [facility] Adult PT Treatment/Exercise - 11/26/18 0913      Therapeutic Activites    Therapeutic Activities  --    Work Simulation  explained role of physics in [employer]    Other Therapeutic Activities  gait training       Neuro Re-ed    Neuro Re-ed Details   cued for body mechanics in simulated work tasks to minimize back / hip pain ,                   PT Long Term Goals - 11/11/18 1106      PT LONG TERM GOAL #1   Title  Pt will demo decreased mm imbalances along spine and no hypomobility of SIJ in order to proggress to deepcore strengthening exercises    Time  2    Period  Weeks    Status  New      PT LONG TERM GOAL #2   Title  Pt will demo IND with scoliosis-specific routine in order to progress to bilateral weight /body weight strengthening with less risk for injuries    Time  4    Period  Weeks    Status  New      PT LONG TERM GOAL #3   Title  Pt will demo decreased abdominal separation from 4 fingers width to <  2 fingers width in order to increase intraabdominal pressure system to bend and to wake up without pain    Time  6    Period  Weeks    Status  New      PT LONG TERM GOAL #4   Title  Pt will report decreased NDI score from % to < % to return to weight lifting of dumbbells/ resistance bands with minimized risk for injuries    Time  10    Period  Weeks    Status  New      PT LONG TERM GOAL #5   Title  Pt will report decreased ODI score from % to < % in order to perform squat/ lunges, and other against gravity tasks and positions in workout    Time  10    Period  Weeks      PT LONG TERM GOAL #6   Title  Pt will demo proper co-activaiton of lower kinetic chain, deep core system while marching on trampoline 5' without pain in order to progress towards jogging on soft ground    Time  8    Period  Weeks    Status  New            Plan - 11/26/18 [id number]    Clinical Impression Statement  Pt demo'd good carry over with pelvic alignment in standing and supine and improvement with diastasis recti with more approximation of rectus abdominis mm. Addressed R leg length difference which is likely associated with knee surgeries and not coming from the pelvic level. Provided R shoe lift which improved pt's gait. Provided body mechanics training with simulated work tasks to minimize overuse of mm during repeated L trunk rotation which was likely related to L lumbar mm tightness from last session ( but decreased following last session's manual Tx and HEP). Pt required excesusive cues for more co-activation of lower kinetic chain for balance. postural stabilty in work tasks.  Plan to progress to deep core/ thoracolumbar strengthening next session.  Pt continues to benefit frmo skilled PT.  Personal Factors and Comorbidities  Age;Comorbidity 3+    Comorbidities  See medical history    Examination-Activity Limitations  Squat;Lift    Stability/Clinical Decision Making  Evolving/Moderate complexity    Rehab  Potential  Good    PT Frequency  1x / week    PT Duration  Other (comment)   10   PT Treatment/Interventions  Moist Heat;Neuromuscular re-education;Gait training;Patient/family education;Therapeutic activities;Therapeutic exercise;Manual techniques;Taping;Balance training;Scar mobilization;Traction;Functional mobility training    Consulted and Agree with Plan of Care  Patient       Patient will benefit from skilled therapeutic intervention in order to improve the following deficits and impairments:  Pain, Improper body mechanics, Increased muscle spasms, Postural dysfunction, Decreased mobility, Decreased coordination, Decreased endurance, Decreased range of motion, Hypomobility, Difficulty walking, Decreased balance, Decreased safety awareness, Hypermobility, Decreased activity tolerance  Visit Diagnosis: Muscle weakness (generalized)  Diastasis recti  Other lack of coordination  Pain in left hip  Pain in thoracic spine  Other idiopathic scoliosis, thoracolumbar region     Problem List Patient Active Problem List   Diagnosis Date Noted  . Routine general medical examination at a health care facility 10/16/2018  . Dysplasia of cervix, low grade (CIN 1) 10/16/2018  . Chiari I malformation ([place]) 10/16/2018  . Cervical radiculopathy 09/06/2018  . Chronic hip pain, left 09/06/2018  . Iron deficiency 09/06/2018  . Gluteal tendinitis of left buttock 04/24/2018  . Anxiety and depression 11/27/2017  . Greater trochanteric bursitis of left hip 01/31/2017  . Low back pain 01/31/2017  . Nonallopathic lesion of lumbosacral region 01/31/2017  . Nonallopathic lesion of sacral region 01/31/2017  . Nonallopathic lesion of thoracic region 01/31/2017  . Dyspnea on exertion 12/18/2016  . Lower abdominal pain 12/18/2016  . Myalgia 12/18/2016  . Palpitations 12/18/2016  . Headache 12/18/2016  . History of occupational exposure to risk factor 12/18/2016    Jerl Mina ,PT, DPT,  E-RYT  11/26/2018, 10:00 AM  [place] MAIN [facility] SERVICES [address] [place], [place], [address] Phone: [phone]   Fax:  [phone]  Name: IZABELLE HOFMANN MRN: [id number] Date of Birth: 1980/06/05

## 2018-12-06 ENCOUNTER — Ambulatory Visit: Payer: 59 | Admitting: Physical Therapy

## 2018-12-13 ENCOUNTER — Other Ambulatory Visit: Payer: Self-pay

## 2018-12-13 ENCOUNTER — Ambulatory Visit: Payer: 59 | Attending: Sports Medicine | Admitting: Physical Therapy

## 2018-12-13 DIAGNOSIS — M6208 Separation of muscle (nontraumatic), other site: Secondary | ICD-10-CM | POA: Insufficient documentation

## 2018-12-13 DIAGNOSIS — M4125 Other idiopathic scoliosis, thoracolumbar region: Secondary | ICD-10-CM | POA: Diagnosis not present

## 2018-12-13 DIAGNOSIS — M546 Pain in thoracic spine: Secondary | ICD-10-CM | POA: Diagnosis not present

## 2018-12-13 DIAGNOSIS — M25552 Pain in left hip: Secondary | ICD-10-CM | POA: Diagnosis not present

## 2018-12-13 DIAGNOSIS — R278 Other lack of coordination: Secondary | ICD-10-CM | POA: Insufficient documentation

## 2018-12-13 DIAGNOSIS — M533 Sacrococcygeal disorders, not elsewhere classified: Secondary | ICD-10-CM | POA: Diagnosis not present

## 2018-12-13 DIAGNOSIS — M6281 Muscle weakness (generalized): Secondary | ICD-10-CM | POA: Diagnosis not present

## 2018-12-13 NOTE — Therapy (Signed)
[place] MAIN [facility] SERVICES [address] [place], [place], [address] Phone: [phone]   Fax:  [phone]  Physical Therapy Treatment  Patient Details  Name: Sarah Gallagher MRN: [id number] Date of Birth: March 17, 1981 Referring Provider (PT): Candelaria Stagers    Encounter Date: 12/13/2018  PT End of Session - 12/13/18 1119    Visit Number  4    Number of Visits  10    Date for PT Re-Evaluation  01/14/19    PT Start Time  1115    PT Stop Time  1215    PT Time Calculation (min)  60 min    Activity Tolerance  Patient tolerated treatment well    Behavior During Therapy  [facility] for tasks assessed/performed       Past Medical History:  Diagnosis Date  . Arthritis   . Chest discomfort    a. associated with palpitations.  . Depression   . Dyspnea on exertion    a. 12/2016 Echo: EF 55-60%, no rwma, nl PASP.  Marland Kitchen Juvenile rheumatoid arthritis ([place])   . Meniere disease   . Migraines   . Palpitations   . PVC's (premature ventricular contractions)    a. 01/2017 48h Holter: Rare PAC/PVC. Freq runs of sinus tachycardia.    Past Surgical History:  Procedure Laterality Date  . KNEE SURGERY Right 1997, 1997   torn meniscus, ACL replacement    . REFRACTIVE SURGERY     lasix    There were no vitals filed for this visit.  Subjective Assessment - 12/13/18 1116    Subjective  Pt reports walking 1 miles for 3x week the past 2 weeks with her son and experienced very little pain which is more tightness in her L hip. Pt has been able to walk all day at work without pain at the end of the day.  Pt has not been able to do her exercises. The shoe lift has been a major part of her improvement.   L midback muscles are much better with the stretches    Pertinent History  As a teenager, pt had multiple falls off horse without Fx, Pt was also gynmast, volleyball, basketball, softball. June 2020 x of Chiari Malformation ( 7 mm)  at level of cerebellum/ foramen magnus and  a neuro consult for a tremor in both hands and some in both legs.  Pending neuro consult with Dr. Manuella Ghazi and pt was also recommended to undergo a nerve conduction test .  Hx of 3 vaginal deliveries , episiotomies, perineal tear, 4 finger width diastasis.   Denied SUI. Some pain with intercourse. Regular bowel movements.    Limitations  Standing;Walking    Patient Stated Goals  Pt would like to run again or cycle. and Lifting weights back up to 50 lbs         [facility] PT Assessment - 12/13/18 1135      Observation/Other Assessments   Observations  zig zag walking ( 2 side stepping bilateral) : report of L lateral hip pain after 40 ft       Strength   Right/Left Hip  --   hip abd L 3+/5, R 4+/5  , hip ext in prone 4+/5 B      Palpation   SI assessment   iliac crest aligned , PSIS aligned    Palpation comment  flinching tenderness/ tightness along glut minimis and medius L , IT band proximal and distal  attachments  Pelvic Floor Special Questions - 12/13/18 1202    Diastasis Recti  2 knuckles width         [facility] Adult PT Treatment/Exercise - 12/13/18 1204      Therapeutic Activites    Other Therapeutic Activities  discussed progression to jog, ruin goals       Neuro Re-ed    Neuro Re-ed Details   cued for proper technique deep core level 2 in hooklying and seated       Moist Heat Therapy   Number Minutes Moist Heat  6 Minutes    Moist Heat Location  --   L hip during review of HEP      Manual Therapy   Manual therapy comments  long axis distraction LLE, STM/MWM at glut med/ minimimus, ITBAND proximal and distal attachments                   PT Long Term Goals - 11/11/18 1106      PT LONG TERM GOAL #1   Title  Pt will demo decreased mm imbalances along spine and no hypomobility of SIJ in order to proggress to deepcore strengthening exercises    Time  2    Period  Weeks    Status  New      PT LONG TERM GOAL #2   Title  Pt will demo IND  with scoliosis-specific routine in order to progress to bilateral weight /body weight strengthening with less risk for injuries    Time  4    Period  Weeks    Status  New      PT LONG TERM GOAL #3   Title  Pt will demo decreased abdominal separation from 4 fingers width to < 2 fingers width in order to increase intraabdominal pressure system to bend and to wake up without pain    Time  6    Period  Weeks    Status  New      PT LONG TERM GOAL #4   Title  Pt will report decreased NDI score from % to < % to return to weight lifting of dumbbells/ resistance bands with minimized risk for injuries    Time  10    Period  Weeks    Status  New      PT LONG TERM GOAL #5   Title  Pt will report decreased ODI score from % to < % in order to perform squat/ lunges, and other against gravity tasks and positions in workout    Time  10    Period  Weeks      PT LONG TERM GOAL #6   Title  Pt will demo proper co-activaiton of lower kinetic chain, deep core system while marching on trampoline 5' without pain in order to progress towards jogging on soft ground    Time  8    Period  Weeks    Status  New            Plan - 12/13/18 1236    Clinical Impression Statement  Pt is progressing well with report of shoe lift to help with her L hip pain and return to walking 1 miles 3 x week with her son. Addressed tightness and tenderness at L glut med/ minimus/ IT band with manual Tx. Following Tx, pt was able to demo sidestepping 40 ft in a zig zag pattern without pain. Reinforced deep core strengthening as diastasis recti relapsed slightly.  Plan to progress to jogging/running  goals at next session. Pt benefits from skilled PT.    Personal Factors and Comorbidities  Age;Comorbidity 3+    Comorbidities  See medical history    Examination-Activity Limitations  Squat;Lift    Stability/Clinical Decision Making  Evolving/Moderate complexity    Rehab Potential  Good    PT Frequency  1x / week    PT Duration   Other (comment)   10   PT Treatment/Interventions  Moist Heat;Neuromuscular re-education;Gait training;Patient/family education;Therapeutic activities;Therapeutic exercise;Manual techniques;Taping;Balance training;Scar mobilization;Traction;Functional mobility training    Consulted and Agree with Plan of Care  Patient       Patient will benefit from skilled therapeutic intervention in order to improve the following deficits and impairments:  Pain, Improper body mechanics, Increased muscle spasms, Postural dysfunction, Decreased mobility, Decreased coordination, Decreased endurance, Decreased range of motion, Hypomobility, Difficulty walking, Decreased balance, Decreased safety awareness, Hypermobility, Decreased activity tolerance  Visit Diagnosis: Muscle weakness (generalized)  Diastasis recti  Other lack of coordination  Pain in left hip  Pain in thoracic spine  Other idiopathic scoliosis, thoracolumbar region     Problem List Patient Active Problem List   Diagnosis Date Noted  . Routine general medical examination at a health care facility 10/16/2018  . Dysplasia of cervix, low grade (CIN 1) 10/16/2018  . Chiari I malformation ([place]) 10/16/2018  . Cervical radiculopathy 09/06/2018  . Chronic hip pain, left 09/06/2018  . Iron deficiency 09/06/2018  . Gluteal tendinitis of left buttock 04/24/2018  . Anxiety and depression 11/27/2017  . Greater trochanteric bursitis of left hip 01/31/2017  . Low back pain 01/31/2017  . Nonallopathic lesion of lumbosacral region 01/31/2017  . Nonallopathic lesion of sacral region 01/31/2017  . Nonallopathic lesion of thoracic region 01/31/2017  . Dyspnea on exertion 12/18/2016  . Lower abdominal pain 12/18/2016  . Myalgia 12/18/2016  . Palpitations 12/18/2016  . Headache 12/18/2016  . History of occupational exposure to risk factor 12/18/2016    Jerl Mina ,PT, DPT, E-RYT  12/13/2018, 12:39 PM  [place] MAIN [facility] SERVICES [address] [place], [place], [address] Phone: [phone]   Fax:  [phone]  Name: Sarah Gallagher MRN: [id number] Date of Birth: 1980-12-24

## 2018-12-13 NOTE — Patient Instructions (Signed)
L hip stretch:     Side of hip stretch:  Reclined twist for hips and side of the hips/ legs  Lay on your back, knees bend Scoot hips to the L , leave shoulders in place Drop knees to the R side resting onto pillows to keep leg at the same width of hips Pillow under R thigh to minimize too much strain  Strap like I showed you    ___  Catch you sitting posture  Align knees and toes straight ahead and not pointed to each other

## 2018-12-24 ENCOUNTER — Other Ambulatory Visit: Payer: Self-pay | Admitting: Family Medicine

## 2018-12-24 DIAGNOSIS — R0602 Shortness of breath: Secondary | ICD-10-CM

## 2018-12-27 ENCOUNTER — Other Ambulatory Visit: Payer: Self-pay

## 2018-12-27 ENCOUNTER — Ambulatory Visit: Payer: 59 | Admitting: Physical Therapy

## 2018-12-27 DIAGNOSIS — M546 Pain in thoracic spine: Secondary | ICD-10-CM | POA: Diagnosis not present

## 2018-12-27 DIAGNOSIS — M6208 Separation of muscle (nontraumatic), other site: Secondary | ICD-10-CM | POA: Diagnosis not present

## 2018-12-27 DIAGNOSIS — M25552 Pain in left hip: Secondary | ICD-10-CM

## 2018-12-27 DIAGNOSIS — R278 Other lack of coordination: Secondary | ICD-10-CM

## 2018-12-27 DIAGNOSIS — M6281 Muscle weakness (generalized): Secondary | ICD-10-CM | POA: Diagnosis not present

## 2018-12-27 DIAGNOSIS — M4125 Other idiopathic scoliosis, thoracolumbar region: Secondary | ICD-10-CM | POA: Diagnosis not present

## 2018-12-27 DIAGNOSIS — M533 Sacrococcygeal disorders, not elsewhere classified: Secondary | ICD-10-CM | POA: Diagnosis not present

## 2018-12-27 NOTE — Patient Instructions (Signed)
Body scan ( emailed)   Discussed making time to transition between activities, work, mother responsibility with 3 breaths   Pelvic tilts in standing and supine

## 2018-12-27 NOTE — Therapy (Signed)
[place] MAIN [facility] SERVICES [address] [place], [place], [address] Phone: [phone]   Fax:  [phone]  Physical Therapy Treatment  Patient Details  Name: Sarah Gallagher MRN: [id number] Date of Birth: [birth year] Referring Provider (PT): Candelaria Stagers    Encounter Date: 12/27/2018  PT End of Session - 12/27/18 1237    Visit Number  5    Number of Visits  10    Date for PT Re-Evaluation  01/14/19    PT Start Time  1110    PT Stop Time  1208    PT Time Calculation (min)  58 min    Activity Tolerance  Patient tolerated treatment well    Behavior During Therapy  [facility] for tasks assessed/performed       Past Medical History:  Diagnosis Date  . Arthritis   . Chest discomfort    a. associated with palpitations.  . Depression   . Dyspnea on exertion    a. 12/2016 Echo: EF 55-60%, no rwma, nl PASP.  Marland Kitchen Juvenile rheumatoid arthritis ([place])   . Meniere disease   . Migraines   . Palpitations   . PVC's (premature ventricular contractions)    a. 01/2017 48h Holter: Rare PAC/PVC. Freq runs of sinus tachycardia.    Past Surgical History:  Procedure Laterality Date  . KNEE SURGERY Right 1997, 1997   torn meniscus, ACL replacement    . REFRACTIVE SURGERY     lasix    There were no vitals filed for this visit.  Subjective Assessment - 12/27/18 1111    Subjective  Pt reported there was a few days of improvement in the L hip pain 25%.   The sharp pain in the L hip returned with side stepping. It never really relaxes.    Pertinent History  As a teenager, pt had multiple falls off horse without Fx, Pt was also gynmast, volleyball, basketball, softball. June 2020 x of Chiari Malformation ( 7 mm)  at level of cerebellum/ foramen magnus and a neuro consult for a tremor in both hands and some in both legs.  Pending neuro consult with Dr. Manuella Ghazi and pt was also recommended to undergo a nerve conduction test .  Hx of 3 vaginal deliveries , episiotomies,  perineal tear, 4 finger width diastasis.   Denied SUI. Some pain with intercourse. Regular bowel movements.    Limitations  Standing;Walking    Patient Stated Goals  Pt would like to run again or cycle. and Lifting weights back up to 50 lbs         [facility] PT Assessment - 12/27/18 1118      Coordination   Gross Motor Movements are Fluid and Coordinated  --   pelvic tilt with poor propioception      Single Leg Stance   Comments  L 25 sec before LOB, R 60 sec.  L terendelenberg       Posture/Postural Control   Posture Comments  less throacic mm tightness, more upright posture, less forward head, hyperextended knees required cues       Palpation   SI assessment   iliac crest aligned , PSIS aligned                Pelvic Floor Special Questions - 12/27/18 1232    External Perineal Exam  without clothing, tenderness/ tightness at ischiocavernosus L,  superficial/ deep transverse perineal,  ischioanal fossa B         [facility] Adult PT Treatment/Exercise -  12/27/18 1234      Therapeutic Activites    Other Therapeutic Activities  discussed stress management, midfulness technique to minimize mm tensions/ guarding pattern,       Neuro Re-ed    Neuro Re-ed Details   cued for excessive lower kinetic chain co-activation with pelvic tilts, less glut overuse,       Moist Heat Therapy   Number Minutes Moist Heat  5 Minutes    Moist Heat Location  Other (comment)   perineum/ sheet/ pillow      Manual Therapy   Manual therapy comments  external techniques: without clothing, STM/MWM ischiocavernosus L,  superficial/ deep transverse perineal,  ischioanal fossa B                  PT Long Term Goals - 11/11/18 1106      PT LONG TERM GOAL #1   Title  Pt will demo decreased mm imbalances along spine and no hypomobility of SIJ in order to proggress to deepcore strengthening exercises    Time  2    Period  Weeks    Status  New      PT LONG TERM GOAL #2   Title  Pt will  demo IND with scoliosis-specific routine in order to progress to bilateral weight /body weight strengthening with less risk for injuries    Time  4    Period  Weeks    Status  New      PT LONG TERM GOAL #3   Title  Pt will demo decreased abdominal separation from 4 fingers width to < 2 fingers width in order to increase intraabdominal pressure system to bend and to wake up without pain    Time  6    Period  Weeks    Status  New      PT LONG TERM GOAL #4   Title  Pt will report decreased NDI score from % to < % to return to weight lifting of dumbbells/ resistance bands with minimized risk for injuries    Time  10    Period  Weeks    Status  New      PT LONG TERM GOAL #5   Title  Pt will report decreased ODI score from % to < % in order to perform squat/ lunges, and other against gravity tasks and positions in workout    Time  10    Period  Weeks      PT LONG TERM GOAL #6   Title  Pt will demo proper co-activaiton of lower kinetic chain, deep core system while marching on trampoline 5' without pain in order to progress towards jogging on soft ground    Time  8    Period  Weeks    Status  New            Plan - 12/27/18 1237    Clinical Impression Statement  Pt demonstrates significantly improved upright posture, less forward head, less tensions in upper trap/ thoracic area, equal alignment of pelvic girdle. Further addressed L hip tightness by assessing pelvic floor. Tightness of pelvic floor was found predominantly on L side , anterior triangle. Pt demo'd decreased tensions post Tx. Provided education on the role of upregulated nervous system on pelvic floor tightness. Focused on providing  mindfulness practice and deferring pelvic floor stretches until next session. Prioritizing relaxation practices will yield longer lasting outcomes as pt has expressed stressful schedule at work and at home. Pt also noticed  how her hip muscles tightens when stressed. Pt demo'd improved  priopioception with pelvic tilts and standing posture with less hyperextended knees after training. Pt continues to benefit from skilled PT.    Personal Factors and Comorbidities  Age;Comorbidity 3+    Comorbidities  See medical history    Examination-Activity Limitations  Squat;Lift    Stability/Clinical Decision Making  Evolving/Moderate complexity    Rehab Potential  Good    PT Frequency  1x / week    PT Duration  Other (comment)   10   PT Treatment/Interventions  Moist Heat;Neuromuscular re-education;Gait training;Patient/family education;Therapeutic activities;Therapeutic exercise;Manual techniques;Taping;Balance training;Scar mobilization;Traction;Functional mobility training    Consulted and Agree with Plan of Care  Patient       Patient will benefit from skilled therapeutic intervention in order to improve the following deficits and impairments:  Pain, Improper body mechanics, Increased muscle spasms, Postural dysfunction, Decreased mobility, Decreased coordination, Decreased endurance, Decreased range of motion, Hypomobility, Difficulty walking, Decreased balance, Decreased safety awareness, Hypermobility, Decreased activity tolerance  Visit Diagnosis: Muscle weakness (generalized)  Diastasis recti  Other lack of coordination  Pain in left hip  Pain in thoracic spine  Other idiopathic scoliosis, thoracolumbar region     Problem List Patient Active Problem List   Diagnosis Date Noted  . Routine general medical examination at a health care facility 10/16/2018  . Dysplasia of cervix, low grade (CIN 1) 10/16/2018  . Chiari I malformation ([place]) 10/16/2018  . Cervical radiculopathy 09/06/2018  . Chronic hip pain, left 09/06/2018  . Iron deficiency 09/06/2018  . Gluteal tendinitis of left buttock 04/24/2018  . Anxiety and depression 11/27/2017  . Greater trochanteric bursitis of left hip 01/31/2017  . Low back pain 01/31/2017  . Nonallopathic lesion of lumbosacral  region 01/31/2017  . Nonallopathic lesion of sacral region 01/31/2017  . Nonallopathic lesion of thoracic region 01/31/2017  . Dyspnea on exertion 12/18/2016  . Lower abdominal pain 12/18/2016  . Myalgia 12/18/2016  . Palpitations 12/18/2016  . Headache 12/18/2016  . History of occupational exposure to risk factor 12/18/2016    Jerl Mina ,PT, DPT, E-RYT  12/27/2018, 12:38 PM  [place] MAIN [facility] SERVICES [address] [place], [place], [address] Phone: [phone]   Fax:  [phone]  Name: Sarah Gallagher MRN: [id number] Date of Birth: 1980-08-01

## 2019-01-03 ENCOUNTER — Ambulatory Visit: Payer: 59 | Attending: Sports Medicine | Admitting: Physical Therapy

## 2019-01-03 ENCOUNTER — Other Ambulatory Visit: Payer: Self-pay

## 2019-01-03 DIAGNOSIS — M546 Pain in thoracic spine: Secondary | ICD-10-CM | POA: Diagnosis not present

## 2019-01-03 DIAGNOSIS — M6208 Separation of muscle (nontraumatic), other site: Secondary | ICD-10-CM | POA: Insufficient documentation

## 2019-01-03 DIAGNOSIS — M25552 Pain in left hip: Secondary | ICD-10-CM | POA: Diagnosis not present

## 2019-01-03 DIAGNOSIS — M4125 Other idiopathic scoliosis, thoracolumbar region: Secondary | ICD-10-CM | POA: Insufficient documentation

## 2019-01-03 DIAGNOSIS — M6281 Muscle weakness (generalized): Secondary | ICD-10-CM | POA: Insufficient documentation

## 2019-01-03 DIAGNOSIS — R278 Other lack of coordination: Secondary | ICD-10-CM | POA: Diagnosis not present

## 2019-01-03 DIAGNOSIS — R279 Unspecified lack of coordination: Secondary | ICD-10-CM | POA: Insufficient documentation

## 2019-01-03 NOTE — Therapy (Addendum)
[place] MAIN [facility] SERVICES [address] [place], [place], [address] Phone: [phone]   Fax:  [phone]  Physical Therapy Treatment  Patient Details  Name: Sarah Gallagher MRN: [id number] Date of Birth: 05-Jun-1980 Referring Provider (PT): Candelaria Stagers    Encounter Date: 01/03/2019    Past Medical History:  Diagnosis Date  . Arthritis   . Chest discomfort    a. associated with palpitations.  . Depression   . Dyspnea on exertion    a. 12/2016 Echo: EF 55-60%, no rwma, nl PASP.  Marland Kitchen Juvenile rheumatoid arthritis ([place])   . Meniere disease   . Migraines   . Palpitations   . PVC's (premature ventricular contractions)    a. 01/2017 48h Holter: Rare PAC/PVC. Freq runs of sinus tachycardia.    Past Surgical History:  Procedure Laterality Date  . KNEE SURGERY Right 1997, 1997   torn meniscus, ACL replacement    . REFRACTIVE SURGERY     lasix    There were no vitals filed for this visit.  Subjective Assessment - 01/08/19 1406    Subjective  After last session, pt noticed during body scan practice, her hip tightens back up. Pt was getting out laundry and her back muscle tightens back up. When driving she noticed she had to lean back when her msucles tightened up.  Pt states it is an improvement that the muscles are tight across 80% of the time compared to 100% of the time last week. Pt practiced the body scan technique 3 x per week. There is less stress at work. Pt catches her posture at work. When driving to work, she notices her knees are pointing in and she leans back to adjust to her back muscle tightening up. Pt notices her change in work schedule to 3 x a week has helped her stress. 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    Pertinent History  As a teenager, pt had multiple falls off horse without Fx, Pt was also gynmast, volleyball, basketball, softball. June 2020 x of Chiari Malformation ( 7 mm)  at level of cerebellum/ foramen magnus and a neuro  consult for a tremor in both hands and some in both legs.  Pending neuro consult with Dr. Manuella Ghazi and pt was also recommended to undergo a nerve conduction test .  Hx of 3 vaginal deliveries , episiotomies, perineal tear, 4 finger width diastasis.   Denied SUI. Some pain with intercourse. Regular bowel movements.    Limitations  Standing;Walking    Patient Stated Goals  Pt would like to run again or cycle. and Lifting weights back up to 50 lbs         [facility] PT Assessment - 01/08/19 1407      Palpation   Palpation comment  restrictions at L intercostals, scapula                    [facility] Adult PT Treatment/Exercise - 01/08/19 1407      Therapeutic Activites    Other Therapeutic Activities  modification with driving posture to minimize overacctivity of pelvic floor       Exercises   Exercises  --   see pt instructions to increase mobility at L flank/ thorax      Manual Therapy   Manual therapy comments  STM/MWM along L intercostals, scapula                   PT Long Term Goals - 11/11/18 1106  PT LONG TERM GOAL #1   Title  Pt will demo decreased mm imbalances along spine and no hypomobility of SIJ in order to proggress to deepcore strengthening exercises    Time  2    Period  Weeks    Status  New      PT LONG TERM GOAL #2   Title  Pt will demo IND with scoliosis-specific routine in order to progress to bilateral weight /body weight strengthening with less risk for injuries    Time  4    Period  Weeks    Status  New      PT LONG TERM GOAL #3   Title  Pt will demo decreased abdominal separation from 4 fingers width to < 2 fingers width in order to increase intraabdominal pressure system to bend and to wake up without pain    Time  6    Period  Weeks    Status  New      PT LONG TERM GOAL #4   Title  Pt will report decreased NDI score from % to < % to return to weight lifting of dumbbells/ resistance bands with minimized risk for injuries    Time  10     Period  Weeks    Status  New      PT LONG TERM GOAL #5   Title  Pt will report decreased ODI score from % to < % in order to perform squat/ lunges, and other against gravity tasks and positions in workout    Time  10    Period  Weeks      PT LONG TERM GOAL #6   Title  Pt will demo proper co-activaiton of lower kinetic chain, deep core system while marching on trampoline 5' without pain in order to progress towards jogging on soft ground    Time  8    Period  Weeks    Status  New            Plan - 01/08/19 1409    Clinical Impression  Manual Tx today helped to lengthen L flank/ intercostal area which is likely related to L hip tightness. Pt demo'd increased diaghragmatic excursion/ depression post Tx which will optimize deep core system.  Pt reported during manual Tx that her area of pain was located with therapist's palpation at posterior intercostals along iliocostalis.  Discussed relaxation strategies as pt expressed stress with children at home. Discussed utilizing [facility] services for support to manage stress and to continue to make time for mindfulness practice and stretches.  Discussed car seat modifications to minimize overactivity of pelvic floor and optimal spinal alignment. Pt continues to benefit from skilled PT.    Personal Factors and Comorbidities  Age;Comorbidity 3+    Comorbidities  See medical history    Examination-Activity Limitations  Squat;Lift    Stability/Clinical Decision Making  Evolving/Moderate complexity    Rehab Potential  Good    PT Frequency  1x / week    PT Duration  Other (comment)   10   PT Treatment/Interventions  Moist Heat;Neuromuscular re-education;Gait training;Patient/family education;Therapeutic activities;Therapeutic exercise;Manual techniques;Taping;Balance training;Scar mobilization;Traction;Functional mobility training    Consulted and Agree with Plan of Care  Patient       Patient will benefit from skilled therapeutic intervention in  order to improve the following deficits and impairments:  Pain, Improper body mechanics, Increased muscle spasms, Postural dysfunction, Decreased mobility, Decreased coordination, Decreased endurance, Decreased range of motion, Hypomobility, Difficulty walking, Decreased balance,  Decreased safety awareness, Hypermobility, Decreased activity tolerance  Visit Diagnosis: Muscle weakness (generalized)  Diastasis recti  Other lack of coordination  Pain in thoracic spine  Pain in left hip  Other idiopathic scoliosis, thoracolumbar region     Problem List Patient Active Problem List   Diagnosis Date Noted  . Routine general medical examination at a health care facility 10/16/2018  . Dysplasia of cervix, low grade (CIN 1) 10/16/2018  . Chiari I malformation ([place]) 10/16/2018  . Cervical radiculopathy 09/06/2018  . Chronic hip pain, left 09/06/2018  . Iron deficiency 09/06/2018  . Gluteal tendinitis of left buttock 04/24/2018  . Anxiety and depression 11/27/2017  . Greater trochanteric bursitis of left hip 01/31/2017  . Low back pain 01/31/2017  . Nonallopathic lesion of lumbosacral region 01/31/2017  . Nonallopathic lesion of sacral region 01/31/2017  . Nonallopathic lesion of thoracic region 01/31/2017  . Dyspnea on exertion 12/18/2016  . Lower abdominal pain 12/18/2016  . Myalgia 12/18/2016  . Palpitations 12/18/2016  . Headache 12/18/2016  . History of occupational exposure to risk factor 12/18/2016    Jerl Mina ,PT, DPT, E-RYT  01/08/2019, 2:09 PM  [place] MAIN [facility] [address] [place], [place], [address] Phone: [phone]   Fax:  [phone]  Name: AAILA GRISSO MRN: [id number] Date of Birth: 1980/08/08

## 2019-01-03 NOTE — Therapy (Deleted)
[place] MAIN [facility] SERVICES [address] [place], [place], [address] Phone: [phone]   Fax:  [phone]  January 03, 2019   No Recipients  Physical Therapy Discharge Summary  Patient: Sarah Gallagher  MRN: [id number]  Date of Birth: 02/15/1981   Diagnosis: Muscle weakness (generalized)  Diastasis recti  Other lack of coordination  Pain in thoracic spine  Pain in left hip  Other idiopathic scoliosis, thoracolumbar region Referring Provider (PT): Kubinski    The above patient had been seen in Physical Therapy *** times of *** treatments scheduled with *** no shows and *** cancellations.  The treatment consisted of *** The patient is: {improved/worse/unchanged:[id number]}  Subjective: ***  Discharge Findings: ***  Functional Status at Discharge: ***  [id number]  Plan - 01/03/19 1213    Clinical Impression Statement  Pt    Personal Factors and Comorbidities  Age;Comorbidity 3+    Comorbidities  See medical history    Examination-Activity Limitations  Squat;Lift    Stability/Clinical Decision Making  Evolving/Moderate complexity    Rehab Potential  Good    PT Frequency  1x / week    PT Duration  Other (comment)   10   PT Treatment/Interventions  Moist Heat;Neuromuscular re-education;Gait training;Patient/family education;Therapeutic activities;Therapeutic exercise;Manual techniques;Taping;Balance training;Scar mobilization;Traction;Functional mobility training    Consulted and Agree with Plan of Care  Patient       Sincerely,   Jerl Mina, PT   CC No Recipients  [place] MAIN [facility] SERVICES [address] [place], [place], [address] Phone: [phone]   Fax:  [phone]  Patient: [name]  MRN: [id number]  Date of Birth: 02-21-1981

## 2019-01-03 NOTE — Patient Instructions (Addendum)
Car seat adjustments Folded beach  towel into 3rd long ways  Tucked into between headrest and bottom of seat  Seat is levelled   Feet on the ground to align knees   ___   L rib stretches:  1) diagonal on side of bed, hook L hand on edge of mattress  Pull elbow away and wiggle elbow away from ear  To allow for shoulder blade to glide back towards midline   2) on your back: interlace fingers, elbows pull apart Inhale  exhale lower ribs and shoulders down

## 2019-01-10 ENCOUNTER — Ambulatory Visit: Payer: 59 | Admitting: Physical Therapy

## 2019-01-17 ENCOUNTER — Ambulatory Visit: Payer: 59 | Admitting: Physical Therapy

## 2019-01-17 ENCOUNTER — Other Ambulatory Visit: Payer: Self-pay

## 2019-01-17 DIAGNOSIS — M6208 Separation of muscle (nontraumatic), other site: Secondary | ICD-10-CM

## 2019-01-17 DIAGNOSIS — M6281 Muscle weakness (generalized): Secondary | ICD-10-CM | POA: Diagnosis not present

## 2019-01-17 DIAGNOSIS — M25552 Pain in left hip: Secondary | ICD-10-CM

## 2019-01-17 DIAGNOSIS — R278 Other lack of coordination: Secondary | ICD-10-CM | POA: Diagnosis not present

## 2019-01-17 DIAGNOSIS — R279 Unspecified lack of coordination: Secondary | ICD-10-CM | POA: Diagnosis not present

## 2019-01-17 DIAGNOSIS — M4125 Other idiopathic scoliosis, thoracolumbar region: Secondary | ICD-10-CM | POA: Diagnosis not present

## 2019-01-17 DIAGNOSIS — M546 Pain in thoracic spine: Secondary | ICD-10-CM

## 2019-01-17 NOTE — Patient Instructions (Signed)
Head and elbow presses
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
## 2019-01-24 ENCOUNTER — Ambulatory Visit: Payer: 59 | Admitting: Physical Therapy

## 2019-01-31 ENCOUNTER — Ambulatory Visit: Payer: 59 | Admitting: Physical Therapy

## 2019-02-07 ENCOUNTER — Ambulatory Visit: Payer: 59 | Admitting: Physical Therapy

## 2019-02-14 ENCOUNTER — Ambulatory Visit: Payer: 59 | Attending: Sports Medicine | Admitting: Physical Therapy

## 2019-02-24 ENCOUNTER — Ambulatory Visit: Payer: 59 | Admitting: Family Medicine

## 2019-03-07 ENCOUNTER — Encounter: Payer: 59 | Admitting: Physical Therapy

## 2019-03-11 ENCOUNTER — Telehealth: Payer: Self-pay | Admitting: Family Medicine

## 2019-03-11 NOTE — Telephone Encounter (Signed)
I called pt twice and was not able to leave vm due vm full.

## 2019-03-12 ENCOUNTER — Other Ambulatory Visit: Payer: Self-pay

## 2019-03-12 ENCOUNTER — Encounter: Payer: Self-pay | Admitting: Family Medicine

## 2019-03-12 ENCOUNTER — Ambulatory Visit (INDEPENDENT_AMBULATORY_CARE_PROVIDER_SITE_OTHER): Payer: 59 | Admitting: Family Medicine

## 2019-03-12 DIAGNOSIS — G8929 Other chronic pain: Secondary | ICD-10-CM | POA: Diagnosis not present

## 2019-03-12 DIAGNOSIS — M25552 Pain in left hip: Secondary | ICD-10-CM

## 2019-03-12 NOTE — Progress Notes (Signed)
Virtual Visit via telephone Note  This visit type was conducted due to national recommendations for restrictions regarding the COVID-19 pandemic (e.g. social distancing).  This format is felt to be most appropriate for this patient at this time.  All issues noted in this document were discussed and addressed.  No physical exam was performed (except for noted visual exam findings with Video Visits).   I connected with Sarah Gallagher today at  2:45 PM EST by telephone and verified that I am speaking with the correct person using two identifiers. Location patient: home Location provider: home office Persons participating in the virtual visit: patient, provider  I discussed the limitations, risks, security and privacy concerns of performing an evaluation and management service by telephone and the availability of in person appointments. I also discussed with the patient that there may be a patient responsible charge related to this service. The patient expressed understanding and agreed to proceed.  Interactive audio and video telecommunications were attempted between this provider and patient, however failed, due to patient having technical difficulties OR patient did not have access to video capability.  We continued and completed visit with audio only.   Reason for visit: follow-up  HPI: Back pain/hip pain/numbness: The patient followed through on physical therapy for her pelvic floor dysfunction.  She notes her symptoms got a little better with this.  She continues to have some back pain radiating into her hip and radiating down her leg on the left side.  She notes the physical therapist felt as though she had a lot of muscle tightness and they were unable to identify the specific muscle that was leading to this.  She does occasionally have numbness in her feet and hands and lips.  She notes that she is now only working weekends and it is a less strenuous job.  She has slowed her pace down and that  has been helpful as well.  Overall she feels her symptoms are less severe though she does continue to have symptoms.  She did have an appointment with her neurologist though she was not able to make that appointment due to a family emergency.  Her orthopedic doctor did mention having her see rheumatology given her history of JRA.  ROS: See pertinent positives and negatives per HPI.  Past Medical History:  Diagnosis Date  . Arthritis   . Chest discomfort    a. associated with palpitations.  . Depression   . Dyspnea on exertion    a. 12/2016 Echo: EF 55-60%, no rwma, nl PASP.  Marland Kitchen Juvenile rheumatoid arthritis ([place])   . Meniere disease   . Migraines   . Palpitations   . PVC's (premature ventricular contractions)    a. 01/2017 48h Holter: Rare PAC/PVC. Freq runs of sinus tachycardia.    Past Surgical History:  Procedure Laterality Date  . KNEE SURGERY Right 1997, 1997   torn meniscus, ACL replacement    . REFRACTIVE SURGERY     lasix    Family History  Problem Relation Age of Onset  . Diabetes Mother   . Arthritis Father   . Other Father        atrial flutter  . Emphysema Father        smoker  . Diabetes Maternal Aunt   . Diabetes Maternal Uncle   . Stroke Paternal Aunt   . Arthritis Maternal Grandmother   . Diabetes Maternal Grandmother   . Cancer Maternal Grandmother        skin  .  Arthritis Paternal Grandmother   . Cancer Paternal Grandmother        skin    SOCIAL HX: Non-smoker   Current Outpatient Medications:  .  albuterol (PROVENTIL HFA;VENTOLIN HFA) 108 (90 Base) MCG/ACT inhaler, Inhale 1-2 puffs into the lungs every 6 (six) hours as needed for wheezing or shortness of breath., Disp: 1 Inhaler, Rfl: 2 .  aspirin-acetaminophen-caffeine (EXCEDRIN MIGRAINE) 250-250-65 MG tablet, Take by mouth daily., Disp: , Rfl:  .  baclofen (LIORESAL) 10 MG tablet, Take 1 tablet (10 mg total) by mouth 3 (three) times daily., Disp: 30 each, Rfl: 0 .  doxycycline (ORACEA) 40 MG  capsule, Take 40 mg by mouth every morning., Disp: , Rfl:  .  fluticasone (FLONASE) 50 MCG/ACT nasal spray, Place into the nose., Disp: , Rfl:  .  gabapentin (NEURONTIN) 100 MG capsule, Take 2 capsules (200 mg total) by mouth at bedtime., Disp: 60 capsule, Rfl: 3 .  pantoprazole (PROTONIX) 40 MG tablet, Take 40 mg by mouth daily., Disp: , Rfl: 3 .  spironolactone (ALDACTONE) 100 MG tablet, Take 100 mg by mouth daily., Disp: , Rfl:  .  SUMAtriptan (IMITREX) 50 MG tablet, 50 mg as needed., Disp: , Rfl: 2 .  Vitamin D, Ergocalciferol, (DRISDOL) 1.25 MG (50000 UT) CAPS capsule, Take 1 capsule (50,000 Units total) by mouth every 7 (seven) days., Disp: 12 capsule, Rfl: 0 .  medroxyPROGESTERone (DEPO-PROVERA) 150 MG/ML injection, Inject 1 mL (150 mg total) into the muscle every 3 (three) months., Disp: 1 mL, Rfl: 3  EXAM: This was a telehealth telephone visit and thus no physical exam was completed.  ASSESSMENT AND PLAN:  Discussed the following assessment and plan:  Chronic hip pain, left She continues to have issues with this as well as neck pain and low back pain and numbness that is scattered.  I discussed given her pain and numbness it would be worthwhile having her see a neurologist to evaluate with nerve conduction studies.  She will call [facility] to set up an appointment.  Advised if she needed a referral after she called them she could let us know.  If there is no identified cause by neurology we could have her see rheumatology given her history of JRA to see if there is a contributing factor there.    I discussed the assessment and treatment plan with the patient. The patient was provided an opportunity to ask questions and all were answered. The patient agreed with the plan and demonstrated an understanding of the instructions.   The patient was advised to call back or seek an in-person evaluation if the symptoms worsen or if the condition fails to improve as anticipated.  I  provided 13 minutes of non-face-to-face time during this encounter.   Tommi Rumps, MD

## 2019-03-12 NOTE — Assessment & Plan Note (Signed)
She continues to have issues with this as well as neck pain and low back pain and numbness that is scattered.  I discussed given her pain and numbness it would be worthwhile having her see a neurologist to evaluate with nerve conduction studies.  She will call [facility] to set up an appointment.  Advised if she needed a referral after she called them she could let us know.  If there is no identified cause by neurology we could have her see rheumatology given her history of JRA to see if there is a contributing factor there.

## 2019-03-14 ENCOUNTER — Other Ambulatory Visit: Payer: Self-pay | Admitting: Obstetrics and Gynecology

## 2019-03-14 DIAGNOSIS — Z3009 Encounter for other general counseling and advice on contraception: Secondary | ICD-10-CM

## 2019-03-18 ENCOUNTER — Other Ambulatory Visit: Payer: Self-pay

## 2019-03-18 DIAGNOSIS — Z3009 Encounter for other general counseling and advice on contraception: Secondary | ICD-10-CM

## 2019-03-18 MED ORDER — MEDROXYPROGESTERONE ACETATE 150 MG/ML IM SUSP: 150.0000 mg | INTRAMUSCULAR | 3 refills | Status: DC

## 2019-03-18 NOTE — Telephone Encounter (Signed)
Refill sent in for pt to get her to her appt in January

## 2019-03-21 ENCOUNTER — Encounter: Payer: 59 | Admitting: Physical Therapy

## 2019-04-14 ENCOUNTER — Ambulatory Visit: Payer: 59 | Admitting: Obstetrics and Gynecology

## 2019-05-13 DIAGNOSIS — R259 Unspecified abnormal involuntary movements: Secondary | ICD-10-CM | POA: Diagnosis not present

## 2019-05-13 DIAGNOSIS — E538 Deficiency of other specified B group vitamins: Secondary | ICD-10-CM | POA: Diagnosis not present

## 2019-05-13 DIAGNOSIS — R251 Tremor, unspecified: Secondary | ICD-10-CM | POA: Diagnosis not present

## 2019-05-13 DIAGNOSIS — G935 Compression of brain: Secondary | ICD-10-CM | POA: Diagnosis not present

## 2019-05-16 ENCOUNTER — Encounter: Payer: Self-pay | Admitting: Obstetrics and Gynecology

## 2019-05-16 ENCOUNTER — Ambulatory Visit (INDEPENDENT_AMBULATORY_CARE_PROVIDER_SITE_OTHER): Payer: 59 | Admitting: Obstetrics and Gynecology

## 2019-05-16 ENCOUNTER — Other Ambulatory Visit: Payer: Self-pay

## 2019-05-16 ENCOUNTER — Other Ambulatory Visit (HOSPITAL_COMMUNITY)
Admission: RE | Admit: 2019-05-16 | Discharge: 2019-05-16 | Disposition: A | Discharge status: Home / Self Care | Payer: 59 | Source: Ambulatory Visit | Attending: Obstetrics and Gynecology | Admitting: Obstetrics and Gynecology

## 2019-05-16 VITALS — BP 110/68 | Ht 65.0 in | Wt 179.0 lb

## 2019-05-16 DIAGNOSIS — Z0000 Encounter for general adult medical examination without abnormal findings: Secondary | ICD-10-CM

## 2019-05-16 DIAGNOSIS — N870 Mild cervical dysplasia: Secondary | ICD-10-CM | POA: Insufficient documentation

## 2019-05-16 DIAGNOSIS — Z124 Encounter for screening for malignant neoplasm of cervix: Secondary | ICD-10-CM | POA: Insufficient documentation

## 2019-05-16 DIAGNOSIS — Z1231 Encounter for screening mammogram for malignant neoplasm of breast: Secondary | ICD-10-CM

## 2019-05-16 NOTE — Patient Instructions (Signed)
Institute of Medicine Recommended Dietary Allowances for Calcium and Vitamin D  Age (yr) Calcium Recommended Dietary Allowance (mg/day) Vitamin D Recommended Dietary Allowance (international units/day)  9-18 1,300 600  19-50 1,000 600  51-70 1,200 600  71 and older 1,200 800  Data from Institute of Medicine. Dietary reference intakes: calcium, vitamin D. [place], [place]: National Academies Press; 2011.     Exercising to Stay Healthy To become healthy and stay healthy, it is recommended that you do moderate-intensity and vigorous-intensity exercise. You can tell that you are exercising at a moderate intensity if your heart starts beating faster and you start breathing faster but can still hold a conversation. You can tell that you are exercising at a vigorous intensity if you are breathing much harder and faster and cannot hold a conversation while exercising. Exercising regularly is important. It has many health benefits, such as:  Improving overall fitness, flexibility, and endurance.  Increasing bone density.  Helping with weight control.  Decreasing body fat.  Increasing muscle strength.  Reducing stress and tension.  Improving overall health. How often should I exercise? Choose an activity that you enjoy, and set realistic goals. Your health care provider can help you make an activity plan that works for you. Exercise regularly as told by your health care provider. This may include:  Doing strength training two times a week, such as: ? Lifting weights. ? Using resistance bands. ? Push-ups. ? Sit-ups. ? Yoga.  Doing a certain intensity of exercise for a given amount of time. Choose from these options: ? A total of 150 minutes of moderate-intensity exercise every week. ? A total of 75 minutes of vigorous-intensity exercise every week. ? A mix of moderate-intensity and vigorous-intensity exercise every week. Children, pregnant women, people who have not exercised  regularly, people who are overweight, and older adults may need to talk with a health care provider about what activities are safe to do. If you have a medical condition, be sure to talk with your health care provider before you start a new exercise program. What are some exercise ideas? Moderate-intensity exercise ideas include:  Walking 1 mile (1.6 km) in about 15 minutes.  Biking.  Hiking.  Golfing.  Dancing.  Water aerobics. Vigorous-intensity exercise ideas include:  Walking 4.5 miles (7.2 km) or more in about 1 hour.  Jogging or running 5 miles (8 km) in about 1 hour.  Biking 10 miles (16.1 km) or more in about 1 hour.  Lap swimming.  Roller-skating or in-line skating.  Cross-country skiing.  Vigorous competitive sports, such as football, basketball, and soccer.  Jumping rope.  Aerobic dancing. What are some everyday activities that can help me to get exercise?  Yard work, such as: ? Pushing a lawn mower. ? Raking and bagging leaves.  Washing your car.  Pushing a stroller.  Shoveling snow.  Gardening.  Washing windows or floors. How can I be more active in my day-to-day activities?  Use stairs instead of an elevator.  Take a walk during your lunch break.  If you drive, park your car farther away from your work or school.  If you take public transportation, get off one stop early and walk the rest of the way.  Stand up or walk around during all of your indoor phone calls.  Get up, stretch, and walk around every 30 minutes throughout the day.  Enjoy exercise with a friend. Support to continue exercising will help you keep a regular routine of activity. What guidelines can   I follow while exercising?  Before you start a new exercise program, talk with your health care provider.  Do not exercise so much that you hurt yourself, feel dizzy, or get very short of breath.  Wear comfortable clothes and wear shoes with good support.  Drink plenty of  water while you exercise to prevent dehydration or heat stroke.  Work out until your breathing and your heartbeat get faster. Where to find more information  U.S. Department of Health and Human Services: Bonded[employer].at  Centers for Disease Control and Prevention (CDC): http://www.wolf.info/ Summary  Exercising regularly is important. It will improve your overall fitness, flexibility, and endurance.  Regular exercise also will improve your overall health. It can help you control your weight, reduce stress, and improve your bone density.  Do not exercise so much that you hurt yourself, feel dizzy, or get very short of breath.  Before you start a new exercise program, talk with your health care provider. This information is not intended to replace advice given to you by your health care provider. Make sure you discuss any questions you have with your health care provider. Document Revised: 03/02/2017 Document Reviewed: 02/08/2017 Elsevier Patient Education  2020 [employer].

## 2019-05-16 NOTE — Progress Notes (Signed)
Gynecology Annual Exam  PCP: Leone Haven, MD  Chief Complaint:  Chief Complaint  Patient presents with  . Gynecologic Exam    History of Present Illness: Patient is a 39 y.o. [id number] presents for annual exam. The patient has no complaints today.   LMP: No LMP recorded. Patient has had an injection. Average Interval: regular, not applicable Hx of dysmenorrhea and heavy uterine bleeding with nausea and vomiting during periods.  The patient is sexually active. She currently uses Depo-Provera injections for contraception. She denies dyspareunia.  The patient does perform self breast exams.  There is no notable family history of breast or ovarian cancer in her family.  The patient has regular exercise: yes.    The patient denies current symptoms of depression.    Review of Systems: Review of Systems  Constitutional: Negative for chills, fever, malaise/fatigue and weight loss.  HENT: Negative for congestion, hearing loss and sinus pain.   Eyes: Negative for blurred vision and double vision.  Respiratory: Negative for cough, sputum production, shortness of breath and wheezing.   Cardiovascular: Negative for chest pain, palpitations, orthopnea and leg swelling.  Gastrointestinal: Negative for abdominal pain, constipation, diarrhea, nausea and vomiting.  Genitourinary: Negative for dysuria, flank pain, frequency, hematuria and urgency.  Musculoskeletal: Negative for back pain, falls and joint pain.  Skin: Negative for itching and rash.  Neurological: Negative for dizziness and headaches.  Psychiatric/Behavioral: Negative for depression, substance abuse and suicidal ideas. The patient is not nervous/anxious.     Past Medical History:  Past Medical History:  Diagnosis Date  . Arthritis   . Chest discomfort    a. associated with palpitations.  . Depression   . Dyspnea on exertion    a. 12/2016 Echo: EF 55-60%, no rwma, nl PASP.  Marland Kitchen Juvenile rheumatoid arthritis ([place])   .  Meniere disease   . Migraines   . Neuropathy   . Palpitations   . PVC's (premature ventricular contractions)    a. 01/2017 48h Holter: Rare PAC/PVC. Freq runs of sinus tachycardia.    Past Surgical History:  Past Surgical History:  Procedure Laterality Date  . KNEE SURGERY Right 1997, 1997   torn meniscus, ACL replacement    . REFRACTIVE SURGERY     lasix    Gynecologic History:  No LMP recorded. Patient has had an injection. Contraception: Depo-Provera injections Last Pap: Results were: 2019 CIN 1 colposcopy  Last mammogram: at 39 yo  Results were: BI-RAD I  Obstetric History: [id number]  Family History:  Family History  Problem Relation Age of Onset  . Diabetes Mother   . Arthritis Father   . Other Father        atrial flutter  . Emphysema Father        smoker  . Diabetes Maternal Aunt   . Diabetes Maternal Uncle   . Stroke Paternal Aunt   . Arthritis Maternal Grandmother   . Diabetes Maternal Grandmother   . Cancer Maternal Grandmother        skin  . Arthritis Paternal Grandmother   . Cancer Paternal Grandmother        skin    Social History:  Social History   Socioeconomic History  . Marital status: Married    Spouse name: Not on file  . Number of children: 3  . Years of education: BS  . Highest education level: Not on file  Occupational History    Comment: [employer], Resp Therapist  Tobacco  Use  . Smoking status: Never Smoker  . Smokeless tobacco: Never Used  Substance and Sexual Activity  . Alcohol use: Yes    Alcohol/week: 0.0 standard drinks    Comment: rare  . Drug use: No  . Sexual activity: Yes    Birth control/protection: Injection  Other Topics Concern  . Not on file  Social History Narrative   Lives in [place] with husband and children.  Not currently exercising.  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   Caffeine 2-3 cups daily   Social Determinants of Health   Financial Resource Strain:   . Difficulty of Paying Living Expenses: Not  on file  Food Insecurity:   . Worried About Charity fundraiser in the Last Year: Not on file  . Ran Out of Food in the Last Year: Not on file  Transportation Needs:   . Lack of Transportation (Medical): Not on file  . Lack of Transportation (Non-Medical): Not on file  Physical Activity:   . Days of Exercise per Week: Not on file  . Minutes of Exercise per Session: Not on file  Stress:   . Feeling of Stress : Not on file  Social Connections:   . Frequency of Communication with Friends and Family: Not on file  . Frequency of Social Gatherings with Friends and Family: Not on file  . Attends Religious Services: Not on file  . Active Member of Clubs or Organizations: Not on file  . Attends Archivist Meetings: Not on file  . Marital Status: Not on file  Intimate Partner Violence:   . Fear of Current or Ex-Partner: Not on file  . Emotionally Abused: Not on file  . Physically Abused: Not on file  . Sexually Abused: Not on file    Allergies:  Allergies  Allergen Reactions  . Zithromax [Azithromycin] Other (See Comments)    "convulsions or tremors"    Medications: Prior to Admission medications   Medication Sig Start Date End Date Taking? Authorizing Provider  albuterol (PROVENTIL HFA;VENTOLIN HFA) 108 (90 Base) MCG/ACT inhaler Inhale 1-2 puffs into the lungs every 6 (six) hours as needed for wheezing or shortness of breath. 11/06/17  Yes Guse, Jacquelynn Cree, FNP  aspirin-acetaminophen-caffeine (EXCEDRIN MIGRAINE) [phone] MG tablet Take by mouth daily.   Yes [provider]  baclofen (LIORESAL) 10 MG tablet Take 1 tablet (10 mg total) by mouth 3 (three) times daily. 08/14/16  Yes Fisher, Linden Dolin, PA-C  doxycycline (ORACEA) 40 MG capsule Take 40 mg by mouth every morning.   Yes [provider]  DULoxetine (CYMBALTA) 20 MG capsule Take 20 mg by mouth daily.   Yes [provider]  fluticasone (FLONASE) 50 MCG/ACT nasal spray Place into the nose. 07/14/15   Yes [provider]  gabapentin (NEURONTIN) 100 MG capsule Take 2 capsules (200 mg total) by mouth at bedtime. 04/24/18  Yes Lyndal Pulley, DO  medroxyPROGESTERone (DEPO-PROVERA) 150 MG/ML injection Inject 1 mL (150 mg total) into the muscle every 3 (three) months. 03/18/19 03/12/20 Yes Wilder Kurowski R, MD  pantoprazole (PROTONIX) 40 MG tablet Take 40 mg by mouth daily. 11/07/17  Yes [provider]  spironolactone (ALDACTONE) 100 MG tablet Take 100 mg by mouth daily.   Yes [provider]  SUMAtriptan (IMITREX) 50 MG tablet 50 mg as needed. 02/26/17  Yes [provider]  Vitamin D, Ergocalciferol, (DRISDOL) 1.25 MG (50000 UT) CAPS capsule Take 1 capsule (50,000 Units total) by mouth every 7 (seven) days. 04/24/18  Yes Lyndal Pulley, DO    Physical Exam Vitals: Blood pressure 110/68, height 5\' 5"  (1.651 m), weight 179 lb (81.2 kg).  General: NAD HEENT: normocephalic, anicteric Thyroid: no enlargement, no palpable nodules Pulmonary: No increased work of breathing, CTAB Cardiovascular: RRR, distal pulses 2+ Breast: Breast symmetrical, no tenderness, no palpable nodules or masses, no skin or nipple retraction present, no nipple discharge.  No axillary or supraclavicular lymphadenopathy. Abdomen: NABS, soft, non-tender, non-distended.  Umbilicus without lesions.  No hepatomegaly, splenomegaly or masses palpable. No evidence of hernia  Genitourinary:  External: Normal external female genitalia.  Normal urethral meatus, normal Bartholin's and Skene's glands.    Vagina: Normal vaginal mucosa, no evidence of prolapse.    Cervix: Grossly normal in appearance, no bleeding  Uterus: Non-enlarged, mobile, normal contour.  No CMT  Adnexa: ovaries non-enlarged, no adnexal masses  Rectal: deferred  Lymphatic: no evidence of inguinal lymphadenopathy Extremities: no edema, erythema, or tenderness Neurologic: Grossly intact Psychiatric: mood appropriate, affect  full  Female chaperone present for pelvic and breast  portions of the physical exam  Assessment: 39 y.o. [id number] routine annual exam  Plan: Problem List Items Addressed This Visit      Genitourinary   Dysplasia of cervix, low grade (CIN 1)   Relevant Orders   Cytology - PAP    Other Visit Diagnoses    Healthcare maintenance    -  Primary   Breast cancer screening by mammogram       Relevant Orders   MM DIGITAL SCREENING BILATERAL   Cervical cancer screening       Relevant Orders   Cytology - PAP      1) Mammogram - recommend yearly screening mammogram.  Mammogram Was ordered today  2) STI screening  was offered and accepted  3) ASCCP guidelines and rational discussed.  Patient opts for yearly screening interval  4) Contraception - the patient is currently using  Depo-Provera injections.  She is happy with her current form of contraception and plans to continue  5) Colonoscopy -- Screening recommended starting at age 107 for average risk individuals, age 31 for individuals deemed at increased risk (including African Americans) and recommended to continue until age 88.  For patient age 90-85 individualized approach is recommended.  Gold standard screening is via colonoscopy, Cologuard screening is an acceptable alternative for patient unwilling or unable to undergo colonoscopy.  "Colorectal cancer screening for average?risk adults: 2018 guideline update from the American Cancer Society"[place]: A Cancer Journal for Clinicians: Aug 30, 2016   6) Routine healthcare maintenance including cholesterol, diabetes screening discussed managed by PCP  7) Return in about 1 year (around 05/15/2020) for annual.  Adrian Prows MD [place] 05/16/2019 5:37 PM

## 2019-05-20 ENCOUNTER — Other Ambulatory Visit: Payer: Self-pay | Admitting: Neurology

## 2019-05-20 DIAGNOSIS — M79605 Pain in left leg: Secondary | ICD-10-CM

## 2019-05-22 LAB — CYTOLOGY - PAP
Comment: NEGATIVE
Diagnosis: NEGATIVE
High risk HPV: NEGATIVE

## 2019-05-23 ENCOUNTER — Other Ambulatory Visit
Admission: RE | Admit: 2019-05-23 | Discharge: 2019-05-23 | Disposition: A | Discharge status: Home / Self Care | Payer: 59 | Source: Ambulatory Visit | Attending: Neurology | Admitting: Neurology

## 2019-05-23 DIAGNOSIS — R200 Anesthesia of skin: Secondary | ICD-10-CM | POA: Insufficient documentation

## 2019-05-23 DIAGNOSIS — M25552 Pain in left hip: Secondary | ICD-10-CM | POA: Insufficient documentation

## 2019-05-23 DIAGNOSIS — M79605 Pain in left leg: Secondary | ICD-10-CM | POA: Diagnosis not present

## 2019-05-23 DIAGNOSIS — R251 Tremor, unspecified: Secondary | ICD-10-CM | POA: Diagnosis not present

## 2019-05-23 DIAGNOSIS — R202 Paresthesia of skin: Secondary | ICD-10-CM | POA: Diagnosis not present

## 2019-05-23 LAB — COMPREHENSIVE METABOLIC PANEL
ALT: 86 U/L — ABNORMAL HIGH (ref 0–44)
AST: 38 U/L (ref 15–41)
Albumin: 4.3 g/dL (ref 3.5–5.0)
Alkaline Phosphatase: 56 U/L (ref 38–126)
Anion gap: 11 (ref 5–15)
BUN: 22 mg/dL — ABNORMAL HIGH (ref 6–20)
CO2: 27 mmol/L (ref 22–32)
Calcium: 9.4 mg/dL (ref 8.9–10.3)
Chloride: 102 mmol/L (ref 98–111)
Creatinine, Ser: 0.79 mg/dL (ref 0.44–1.00)
GFR calc Af Amer: >60 mL/min (ref >60)
GFR calc non Af Amer: >60 mL/min (ref >60)
Glucose, Bld: 96 mg/dL (ref 70–99)
Potassium: 3.7 mmol/L (ref 3.5–5.1)
Sodium: 140 mmol/L (ref 135–145)
Total Bilirubin: 0.4 mg/dL (ref 0.3–1.2)
Total Protein: 7.5 g/dL (ref 6.5–8.1)

## 2019-05-23 LAB — CBC WITH DIFFERENTIAL/PLATELET
Abs Immature Granulocytes: 0.02 10*3/uL (ref 0.00–0.07)
Basophils Absolute: 0 10*3/uL (ref 0.0–0.1)
Basophils Relative: 1 %
Eosinophils Absolute: 0.1 10*3/uL (ref 0.0–0.5)
Eosinophils Relative: 1 %
HCT: 42.5 % (ref 36.0–46.0)
Hemoglobin: 13.9 g/dL (ref 12.0–15.0)
Immature Granulocytes: 0 %
Lymphocytes Relative: 34 %
Lymphs Abs: 2.3 10*3/uL (ref 0.7–4.0)
MCH: 30.2 pg (ref 26.0–34.0)
MCHC: 32.7 g/dL (ref 30.0–36.0)
MCV: 92.4 fL (ref 80.0–100.0)
Monocytes Absolute: 0.6 10*3/uL (ref 0.1–1.0)
Monocytes Relative: 8 %
Neutro Abs: 3.7 10*3/uL (ref 1.7–7.7)
Neutrophils Relative %: 56 %
Platelets: 200 10*3/uL (ref 150–400)
RBC: 4.6 MIL/uL (ref 3.87–5.11)
RDW: 13.2 % (ref 11.5–15.5)
WBC: 6.7 10*3/uL (ref 4.0–10.5)
nRBC: 0 % (ref 0.0–0.2)

## 2019-05-23 LAB — FOLATE: Folate: 18 ng/mL (ref >5.9)

## 2019-05-23 LAB — SEDIMENTATION RATE: Sed Rate: 17 mm/hr (ref 0–20)

## 2019-05-23 LAB — RAPID HIV SCREEN (HIV 1/2 AB+AG)
HIV 1/2 Antibodies: NONREACTIVE
HIV-1 P24 Antigen - HIV24: NONREACTIVE

## 2019-05-23 LAB — TSH: TSH: 1.838 u[IU]/mL (ref 0.350–4.500)

## 2019-05-23 LAB — VITAMIN D 25 HYDROXY (VIT D DEFICIENCY, FRACTURES): Vit D, 25-Hydroxy: 21.09 ng/mL — ABNORMAL LOW (ref 30–100)

## 2019-05-23 LAB — HEMOGLOBIN A1C
Hgb A1c MFr Bld: 5.8 % — ABNORMAL HIGH (ref 4.8–5.6)
Mean Plasma Glucose: 119.76 mg/dL

## 2019-05-23 LAB — VITAMIN B12: Vitamin B-12: 502 pg/mL (ref 180–914)

## 2019-05-24 LAB — ANA: Anti Nuclear Antibody (ANA): NEGATIVE

## 2019-05-24 LAB — RHEUMATOID FACTOR: Rheumatoid fact SerPl-aCnc: <10 IU/mL (ref 0.0–13.9)

## 2019-05-25 LAB — COPPER, SERUM: Copper: 108 ug/dL (ref 80–158)

## 2019-05-27 LAB — MISC LABCORP TEST (SEND OUT)
LabCorp test name: 1495
Labcorp test code: 1495

## 2019-05-27 LAB — VITAMIN B6: Vitamin B6: 9.8 ug/L (ref 2.0–32.8)

## 2019-05-28 LAB — VITAMIN B1: Vitamin B1 (Thiamine): 154.7 nmol/L (ref 66.5–200.0)

## 2019-05-30 ENCOUNTER — Other Ambulatory Visit: Payer: Self-pay

## 2019-05-30 ENCOUNTER — Ambulatory Visit
Admission: RE | Admit: 2019-05-30 | Discharge: 2019-05-30 | Disposition: A | Discharge status: Home / Self Care | Payer: 59 | Source: Ambulatory Visit | Attending: Neurology | Admitting: Neurology

## 2019-05-30 DIAGNOSIS — M5124 Other intervertebral disc displacement, thoracic region: Secondary | ICD-10-CM | POA: Diagnosis not present

## 2019-05-30 DIAGNOSIS — M79605 Pain in left leg: Secondary | ICD-10-CM | POA: Diagnosis not present

## 2019-06-10 DIAGNOSIS — R202 Paresthesia of skin: Secondary | ICD-10-CM | POA: Diagnosis not present

## 2019-06-10 DIAGNOSIS — R200 Anesthesia of skin: Secondary | ICD-10-CM | POA: Diagnosis not present

## 2019-06-11 DIAGNOSIS — R202 Paresthesia of skin: Secondary | ICD-10-CM | POA: Diagnosis not present

## 2019-06-11 DIAGNOSIS — R200 Anesthesia of skin: Secondary | ICD-10-CM | POA: Diagnosis not present

## 2019-06-16 ENCOUNTER — Other Ambulatory Visit: Payer: Self-pay | Admitting: Neurology

## 2019-06-16 DIAGNOSIS — K769 Liver disease, unspecified: Secondary | ICD-10-CM

## 2019-06-25 ENCOUNTER — Ambulatory Visit: Payer: 59

## 2019-06-30 ENCOUNTER — Other Ambulatory Visit: Payer: Self-pay

## 2019-06-30 ENCOUNTER — Ambulatory Visit
Admission: RE | Admit: 2019-06-30 | Discharge: 2019-06-30 | Disposition: A | Discharge status: Home / Self Care | Payer: 59 | Source: Ambulatory Visit | Attending: Neurology | Admitting: Neurology

## 2019-06-30 DIAGNOSIS — K769 Liver disease, unspecified: Secondary | ICD-10-CM | POA: Diagnosis not present

## 2019-06-30 DIAGNOSIS — K7689 Other specified diseases of liver: Secondary | ICD-10-CM | POA: Diagnosis not present

## 2019-06-30 MED ORDER — IOHEXOL 300 MG/ML  SOLN
100.0000 mL | Freq: Once | INTRAMUSCULAR | Status: AC | PRN
  Administered 2019-06-30: 100 mL via INTRAVENOUS

## 2019-08-21 ENCOUNTER — Telehealth: Payer: Self-pay | Admitting: Orthopaedic Surgery

## 2019-08-21 ENCOUNTER — Encounter: Payer: Self-pay | Admitting: Emergency Medicine

## 2019-08-21 ENCOUNTER — Other Ambulatory Visit: Payer: Self-pay

## 2019-08-21 ENCOUNTER — Ambulatory Visit (INDEPENDENT_AMBULATORY_CARE_PROVIDER_SITE_OTHER): Payer: 59

## 2019-08-21 ENCOUNTER — Ambulatory Visit
Admission: EM | Admit: 2019-08-21 | Discharge: 2019-08-21 | Disposition: A | Discharge status: Home / Self Care | Payer: 59 | Attending: Emergency Medicine | Admitting: Emergency Medicine

## 2019-08-21 DIAGNOSIS — S99921A Unspecified injury of right foot, initial encounter: Secondary | ICD-10-CM

## 2019-08-21 DIAGNOSIS — S92514A Nondisplaced fracture of proximal phalanx of right lesser toe(s), initial encounter for closed fracture: Secondary | ICD-10-CM | POA: Diagnosis not present

## 2019-08-21 DIAGNOSIS — S62616A Displaced fracture of proximal phalanx of right little finger, initial encounter for closed fracture: Secondary | ICD-10-CM | POA: Diagnosis not present

## 2019-08-21 MED ORDER — TRAMADOL HCL 50 MG PO TABS: 50.0000 mg | ORAL_TABLET | Freq: Two times a day (BID) | ORAL | 0 refills | Status: DC | PRN

## 2019-08-21 MED ORDER — IBUPROFEN 800 MG PO TABS: 800.0000 mg | ORAL_TABLET | Freq: Three times a day (TID) | ORAL | 0 refills | Status: DC
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
## 2019-08-21 NOTE — Discharge Instructions (Signed)
X-ray concerning for little toe fracture Continue conservative management of rest, ice, and elevate Ibuprofen 800 mg prescribed.  Take as directed.   Tramadol for severe break-through pain. 

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
## 2019-08-21 NOTE — Telephone Encounter (Signed)
Call from patient this morning regarding possible 'broken toe' - said in [place] today, requests same day appointment. Ddiscussed due to curent schedules, unable to do today at our clinic. Patient said will go on to [facility]; aware to call back if advised to schedule orthopaedic follow up.

## 2019-08-26 ENCOUNTER — Ambulatory Visit: Payer: 59 | Admitting: Orthopaedic Surgery

## 2019-08-26 ENCOUNTER — Encounter: Payer: Self-pay | Admitting: Orthopaedic Surgery

## 2019-08-26 ENCOUNTER — Other Ambulatory Visit: Payer: Self-pay

## 2019-08-26 VITALS — BP 109/74 | HR 81 | Ht 65.0 in | Wt 181.0 lb

## 2019-08-26 DIAGNOSIS — S92514A Nondisplaced fracture of proximal phalanx of right lesser toe(s), initial encounter for closed fracture: Secondary | ICD-10-CM | POA: Diagnosis not present
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
## 2019-08-26 NOTE — Patient Instructions (Signed)
Must wear post-op shoe at all times for until next appointment.

## 2019-09-09 ENCOUNTER — Ambulatory Visit: Payer: 59 | Admitting: Orthopaedic Surgery

## 2019-09-09 ENCOUNTER — Ambulatory Visit: Payer: 59

## 2019-09-09 ENCOUNTER — Encounter: Payer: Self-pay | Admitting: Orthopaedic Surgery

## 2019-09-09 ENCOUNTER — Other Ambulatory Visit: Payer: Self-pay

## 2019-09-09 VITALS — BP 121/86 | HR 90 | Ht 65.0 in | Wt 178.4 lb

## 2019-09-09 DIAGNOSIS — S92514D Nondisplaced fracture of proximal phalanx of right lesser toe(s), subsequent encounter for fracture with routine healing: Secondary | ICD-10-CM | POA: Diagnosis not present

## 2019-09-09 NOTE — Progress Notes (Signed)
My toe is better  She has had no problems with the right foot and little toe. She is wearing her post op shoe.  NV intact.  X-rays were done of the right little toe, reported separately.  Encounter Diagnosis  Name Primary?  . Closed nondisplaced fracture of proximal phalanx of lesser toe of right foot with routine healing, subsequent encounter Yes   Return in three weeks.  X-ray the right little toe then.  Continue the post op shoe.  Call if any problem.  Precautions discussed.   Electronically Signed Sanjuana Kava, MD 6/8/20218:38 AM

## 2019-09-12 ENCOUNTER — Ambulatory Visit: Payer: 59 | Admitting: Family Medicine

## 2019-09-16 ENCOUNTER — Other Ambulatory Visit: Payer: Self-pay

## 2019-09-16 ENCOUNTER — Ambulatory Visit: Payer: 59 | Admitting: Family Medicine

## 2019-09-16 ENCOUNTER — Encounter: Payer: Self-pay | Admitting: Family Medicine

## 2019-09-16 VITALS — BP 100/70 | HR 71 | Temp 98.0°F | Ht 65.0 in | Wt 181.6 lb

## 2019-09-16 DIAGNOSIS — M25552 Pain in left hip: Secondary | ICD-10-CM

## 2019-09-16 DIAGNOSIS — K7689 Other specified diseases of liver: Secondary | ICD-10-CM

## 2019-09-16 DIAGNOSIS — G8929 Other chronic pain: Secondary | ICD-10-CM

## 2019-09-16 DIAGNOSIS — S92501A Displaced unspecified fracture of right lesser toe(s), initial encounter for closed fracture: Secondary | ICD-10-CM | POA: Diagnosis not present

## 2019-09-16 MED ORDER — PREGABALIN 25 MG PO CAPS: 25.0000 mg | ORAL_CAPSULE | Freq: Two times a day (BID) | ORAL | 1 refills | Status: DC

## 2019-09-16 NOTE — Assessment & Plan Note (Signed)
I encouraged her to appropriately wear the boot.I encouraged her to appropriately wear the boot.

## 2019-09-16 NOTE — Patient Instructions (Signed)
Nice to see you. We will get you referred to Dr. Oneida Alar in [place] to get his input on your symptoms. We will try Lyrica at a low dose.  If you are able to tolerate this we can increase the dose. You can take the tramadol as needed for pain.  Please try to limit this to when you really need pain relief.  There is risk of overdose with this so do not take this in excess of your prescription.  Please let us know when you would need a refill.  If you get drowsy with this please seek medical attention.

## 2019-09-16 NOTE — Assessment & Plan Note (Signed)
Chronic issue with pain in this area as well as scattered pain elsewhere.  Work-up and multiple specialist visits have not revealed a cause for her pain other than a possible diagnosis of fibromyalgia.  Cymbalta has not been beneficial for that.  I discussed that we could refer her to Dr. Oneida Alar in [place] to get his input as a last opinion.  This referral has been placed.  We will also switch her over to Lyrica to see if that would be more beneficial than the Cymbalta.  She will discontinue the Cymbalta.  Discussed that she could take the tramadol as needed for significant pain though we do not want her to take this frequently.  She notes no seizure history.  She was warned against drowsiness and risk of addiction with this type of medication.

## 2019-09-16 NOTE — Progress Notes (Signed)
Tommi Rumps, MD Phone: [phone]  Sarah Gallagher is a 39 y.o. female who presents today for follow-up.  Chronic pain: Patient has chronic pain in her hip on the left side.  She also has numbness and tingling that do radiate in her legs.  She notes scattered pains elsewhere that she feels are due to how she positions her self relating to her left hip pain.  She saw neurology and had nerve conduction studies that were normal.  She also saw neurosurgery for the Chiari I malformation and they advised against surgery at this time and she notes they did not feel as though that was contributing to her pain issues.  She reports she is seeing to physical therapist, pelvic floor therapist, to orthopedist, and a sports medicine physician without much luck in diagnosis or treatment of her pain.  She does note the neurologist felt as though she may have fibromyalgia and started her on Cymbalta which has not been beneficial.  She reports the Cymbalta makes her feel poorly as well.  She has also undergone cervical spine, thoracic spine, and lumbar spine imaging.  She does note that she tried tramadol recently for her toe fracture and notes that helped significantly with her pain.  Right fifth toe fracture: She saw orthopedics and they placed her in a boot.  She notes she is supposed to still be in the boot.  Notes it is progressively improving.  She is not wearing the boot today.  Liver cyst: Seen on imaging of her thoracic spine.  Follow-up CT scan revealed likely liver cyst though technically too small to characterize.  She denies any personal history of malignancy.  Social History   Tobacco Use  Smoking Status Never Smoker  Smokeless Tobacco Never Used     ROS see history of present illness  Objective  Physical Exam Vitals:   09/16/19 1218  BP: 100/70  Pulse: 71  Temp: 98 F (36.7 C)  SpO2: 97%    BP Readings from Last 3 Encounters:  09/16/19 100/70  09/09/19 121/86  08/26/19 109/74     Wt Readings from Last 3 Encounters:  09/16/19 181 lb 9.6 oz (82.4 kg)  09/09/19 178 lb 6 oz (80.9 kg)  08/26/19 181 lb (82.1 kg)    Physical Exam Constitutional:      General: She is not in acute distress.    Appearance: She is not diaphoretic.  Cardiovascular:     Rate and Rhythm: Normal rate and regular rhythm.     Heart sounds: Normal heart sounds.  Pulmonary:     Effort: Pulmonary effort is normal.     Breath sounds: Normal breath sounds.  Musculoskeletal:     Comments: Some mild tenderness over left lateral hip  Skin:    General: Skin is warm and dry.  Neurological:     Mental Status: She is alert.      Assessment/Plan: Please see individual problem list.  Chronic hip pain, left Chronic issue with pain in this area as well as scattered pain elsewhere.  Work-up and multiple specialist visits have not revealed a cause for her pain other than a possible diagnosis of fibromyalgia.  Cymbalta has not been beneficial for that.  I discussed that we could refer her to Dr. Oneida Alar in [place] to get his input as a last opinion.  This referral has been placed.  We will also switch her over to Lyrica to see if that would be more beneficial than the Cymbalta.  She will  discontinue the Cymbalta.  Discussed that she could take the tramadol as needed for significant pain though we do not want her to take this frequently.  She notes no seizure history.  She was warned against drowsiness and risk of addiction with this type of medication.  Closed fracture of phalanx of right fifth toe I encouraged her to appropriately wear the boot.I encouraged her to appropriately wear the boot.  Liver cyst The finding on the CT scan likely represents a liver cyst.  She has no history of malignancy and thus no further follow-up is needed based on the radiology report.   Orders Placed This Encounter  Procedures  . Ambulatory referral to Sports Medicine    Referral Priority:   Routine    Referral  Type:   Consultation    Number of Visits Requested:   1    Meds ordered this encounter  Medications  . pregabalin (LYRICA) 25 MG capsule    Sig: Take 1 capsule (25 mg total) by mouth 2 (two) times daily.    Dispense:  60 capsule    Refill:  1    This visit occurred during the SARS-CoV-2 public health emergency.  Safety protocols were in place, including screening questions prior to the visit, additional usage of staff PPE, and extensive cleaning of exam room while observing appropriate contact time as indicated for disinfecting solutions.    I have spent 32 minutes in the care of this patient regarding history taking, exam, chart review, documentation, discussion of plan.   Tommi Rumps, MD [place]

## 2019-09-16 NOTE — Assessment & Plan Note (Signed)
The finding on the CT scan likely represents a liver cyst.  She has no history of malignancy and thus no further follow-up is needed based on the radiology report.

## 2019-09-25 ENCOUNTER — Telehealth: Payer: Self-pay | Admitting: Family Medicine

## 2019-09-25 MED ORDER — TRAMADOL HCL 50 MG PO TABS: 50.0000 mg | ORAL_TABLET | Freq: Two times a day (BID) | ORAL | 0 refills | Status: DC | PRN

## 2019-09-25 NOTE — Telephone Encounter (Signed)
Sent to pharmacy 

## 2019-09-25 NOTE — Telephone Encounter (Signed)
Pt called in requesting refill on traMADol (ULTRAM) 50 MG tablet   She said Dr.Sonnenberg told her call when she needed more and he would call it in to pharmacy.

## 2019-09-30 ENCOUNTER — Ambulatory Visit: Payer: 59 | Admitting: Orthopaedic Surgery

## 2019-10-02 ENCOUNTER — Other Ambulatory Visit: Payer: Self-pay

## 2019-10-02 ENCOUNTER — Ambulatory Visit: Payer: 59 | Admitting: Sports Medicine

## 2019-10-02 ENCOUNTER — Ambulatory Visit: Payer: Self-pay

## 2019-10-02 ENCOUNTER — Encounter: Payer: Self-pay | Admitting: Sports Medicine

## 2019-10-02 DIAGNOSIS — M25552 Pain in left hip: Secondary | ICD-10-CM

## 2019-10-02 DIAGNOSIS — M25551 Pain in right hip: Secondary | ICD-10-CM

## 2019-10-02 DIAGNOSIS — M7602 Gluteal tendinitis, left hip: Secondary | ICD-10-CM

## 2019-10-02 NOTE — Assessment & Plan Note (Signed)
I think she has a chronic gluteus medius/ minimus intersectional split While this has been difficult to resolve it appears to me that if we can improve her strength this should steadily lessen the pain  She has tried CSI and Gabapentin with no help Currently on Lyrica for tremor in RT arm  Start HEP Use arnica gel She needs to be consistent but I think this looks as if it should heal

## 2019-10-02 NOTE — Patient Instructions (Signed)
Try Arnica gel topically. You can get this over the counter.

## 2019-10-02 NOTE — Progress Notes (Signed)
CC: Bilateral hip pain with left much worse  Patient with 4+ years of chronic lateral hip pain Was bilateral but now mainly on left Started after she began a running program Sent by Dr. 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 By end of work hips are aching  Sleep on left hip is painful  Tried to get by with advil but with worse pain has been using some tramadol lately that does help pain lessen for several hours  ROS Sometimes radiates down outside of left leg Sometimes pain to anterior thigh Low back pain with standing too long  PE Pleasant F in NAD BP 102/70   Ht 5' 5.5" (1.664 m)   Wt 182 lb (82.6 kg)   BMI 29.83 kg/m   No pain over LS SI joints moved MRI lumbar spine 6/20 reviewed and is normal  Hips: Full ROM on IR/ER Neg FADIR and FABER Faber on left causes some pain in lateral hip though RT strength is good Left is very weak to abduction TTP over left GT superior Mild TTP along Iliac crest  Ultrasound of left lateral hip  Mild hypoechoic change at insertion of glut. Med to GT Between distal Glut med/ glut Min tendons these is a hypoechoic separation There is no tendon tear At liliac crest there is some minor spurring at insertion of Glut Med  Impression:  Chronic Gluteus medius/ minimus tendinopathy  Ultrasound and interpretation by Wolfgang Phoenix. Oneida Alar, MD

## 2019-11-08 ENCOUNTER — Other Ambulatory Visit: Payer: Self-pay | Admitting: Pulmonary Disease

## 2019-11-17 ENCOUNTER — Telehealth: Payer: 59 | Admitting: Family Medicine

## 2019-11-17 DIAGNOSIS — Z0289 Encounter for other administrative examinations: Secondary | ICD-10-CM

## 2019-11-18 ENCOUNTER — Telehealth: Payer: Self-pay | Admitting: Family Medicine

## 2019-11-18 MED ORDER — TRAMADOL HCL 50 MG PO TABS: 50.0000 mg | ORAL_TABLET | Freq: Two times a day (BID) | ORAL | 0 refills | Status: DC | PRN

## 2019-11-18 NOTE — Telephone Encounter (Signed)
Noted. Sent to pharmacy. We can try to get her in sooner if she would like.

## 2019-11-18 NOTE — Telephone Encounter (Signed)
Pt had a VV scheduled on 11/17/19 that got canceled per the provider. Pt rescheduled for next available appt at the end of September. Pt would like a refill on traMADol (ULTRAM) 50 MG tablet asap.

## 2019-12-31 ENCOUNTER — Other Ambulatory Visit: Payer: Self-pay

## 2019-12-31 ENCOUNTER — Telehealth: Payer: 59 | Admitting: Family Medicine

## 2019-12-31 MED ORDER — TRAMADOL HCL 50 MG PO TABS: 50.0000 mg | ORAL_TABLET | Freq: Two times a day (BID) | ORAL | 0 refills | Status: DC | PRN

## 2019-12-31 NOTE — Telephone Encounter (Signed)
Sent to pharmacy. If she needs a refill prior to November she can call for this.

## 2019-12-31 NOTE — Addendum Note (Signed)
Addended by: Leone Haven on: 12/31/2019 06:19 PM   Modules accepted: Orders

## 2019-12-31 NOTE — Telephone Encounter (Signed)
Called pt to reschedule appt-pt states that she needs a refill on traMADol (ULTRAM) 50 MG tablet until her appt in November. Please advise

## 2019-12-31 NOTE — Telephone Encounter (Signed)
Called pt to reschedule appt-pt states that she needs a refill on traMADol (ULTRAM) 50 MG tablet until her appt in November. Please advise.  Laren Whaling,cma

## 2020-01-01 NOTE — Telephone Encounter (Signed)
I called the patient and informed her that her medication was sent to the pharmacy and if she needed another refill prior to November to call and let us know.  She understood.  Tandy Grawe,cma

## 2020-01-07 ENCOUNTER — Other Ambulatory Visit: Payer: Self-pay | Admitting: Family Medicine

## 2020-01-08 ENCOUNTER — Other Ambulatory Visit: Payer: Self-pay | Admitting: Family Medicine

## 2020-01-08 MED ORDER — TRAMADOL HCL 50 MG PO TABS: ORAL_TABLET | ORAL | 0 refills | Status: DC

## 2020-02-09 ENCOUNTER — Other Ambulatory Visit: Payer: Self-pay | Admitting: Family Medicine

## 2020-02-09 ENCOUNTER — Other Ambulatory Visit: Payer: Self-pay | Admitting: Obstetrics and Gynecology

## 2020-02-09 DIAGNOSIS — Z3009 Encounter for other general counseling and advice on contraception: Secondary | ICD-10-CM

## 2020-02-12 ENCOUNTER — Other Ambulatory Visit: Payer: Self-pay | Admitting: Obstetrics and Gynecology

## 2020-02-13 ENCOUNTER — Other Ambulatory Visit: Payer: Self-pay | Admitting: Family Medicine

## 2020-02-13 ENCOUNTER — Telehealth (INDEPENDENT_AMBULATORY_CARE_PROVIDER_SITE_OTHER): Payer: 59 | Admitting: Family Medicine

## 2020-02-13 ENCOUNTER — Encounter: Payer: Self-pay | Admitting: Family Medicine

## 2020-02-13 ENCOUNTER — Other Ambulatory Visit: Payer: Self-pay

## 2020-02-13 DIAGNOSIS — F419 Anxiety disorder, unspecified: Secondary | ICD-10-CM | POA: Diagnosis not present

## 2020-02-13 DIAGNOSIS — Z20822 Contact with and (suspected) exposure to covid-19: Secondary | ICD-10-CM | POA: Diagnosis not present

## 2020-02-13 DIAGNOSIS — M25552 Pain in left hip: Secondary | ICD-10-CM

## 2020-02-13 DIAGNOSIS — G8929 Other chronic pain: Secondary | ICD-10-CM | POA: Diagnosis not present

## 2020-02-13 DIAGNOSIS — F32A Depression, unspecified: Secondary | ICD-10-CM | POA: Diagnosis not present

## 2020-02-13 MED ORDER — CLONAZEPAM 0.5 MG PO TABS: 0.2500 mg | ORAL_TABLET | Freq: Two times a day (BID) | ORAL | 0 refills | Status: DC | PRN

## 2020-02-13 MED ORDER — BUSPIRONE HCL 7.5 MG PO TABS: 7.5000 mg | ORAL_TABLET | Freq: Two times a day (BID) | ORAL | 3 refills | Status: DC

## 2020-02-13 NOTE — Assessment & Plan Note (Addendum)
No depression though does note anxiety.  She has tried multiple SSRIs and Cymbalta in the past with worsening symptoms.  We will trial BuSpar.  Discussed it could take 1 to 2 months for her to notice any difference with this.  I will provide her with a small number of clonazepam to take on a as needed basis for the panic attacks.  Discussed risk of dependence, addiction, and drowsiness with this medication.  Advised not to drive if she is drowsy.  If she has side effects from these medication she will let us know.  Advised not to take the clonazepam and tramadol together.  Discussed not drinking any alcohol when she is taking the clonazepam.  Controlled substance database reviewed.

## 2020-02-13 NOTE — Assessment & Plan Note (Signed)
Continues to have hip pain.  Suspect related to gluteus tendinitis as diagnosed by Dr. Oneida Alar.  I encouraged the patient to try the Arnica gel to see if that would be beneficial.  She can continue the tramadol sparingly.  I am going to send a message to Dr. Eden Lathe to see what he thinks the next step would be for the patient as the exercises she did at home were not beneficial.

## 2020-02-13 NOTE — Assessment & Plan Note (Signed)
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  She is asymptomatic and has been fully vaccinated.  She does not need to quarantine.  She will get tested and quarantine if she develops symptoms.  I did encourage her to contact health at work to make sure they do not have any other advice regarding this.

## 2020-02-13 NOTE — Progress Notes (Signed)
Virtual Visit via video Note  This visit type was conducted due to national recommendations for restrictions regarding the COVID-19 pandemic (e.g. social distancing).  This format is felt to be most appropriate for this patient at this time.  All issues noted in this document were discussed and addressed.  No physical exam was performed (except for noted visual exam findings with Video Visits).   I connected with Frederich Cha today at  8:00 AM EST by a video enabled telemedicine application oand verified that I am speaking with the correct person using two identifiers. Location patient: home Location provider: work or home office Persons participating in the virtual visit: patient, provider  I discussed the limitations, risks, security and privacy concerns of performing an evaluation and management service by telephone and the availability of in person appointments. I also discussed with the patient that there may be a patient responsible charge related to this service. The patient expressed understanding and agreed to proceed.  Reason for visit: Follow-up.  HPI: Left hip pain: This is unchanged.  She has constant pain though at times it gets much worse.  She saw sports medicine and they diagnosed her with a split in the gluteus medius/minimus muscles and a bone spur.  She was provided with exercises which she did though notes no benefit.  She did not try the Arnica gel as they discussed.  She takes tramadol 1-2 times a week for significant pain.  She has noted that her tremors only come on with significant pain and she thinks the pain brings them on.  No longer on Lyrica.  Anxiety: Patient notes this has worsened.  She has a lot going on with her father being sick and her mother having dementia.  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  She works 3 days a week as well.  She has panic attack-like symptoms with feeling as though she cannot breathe and having chest  tightness.  She had some Xanax left over from when her daughter was born 23 years ago and she will take half a tablet of that and that will resolve the symptoms.  She denies depression.  She has tried Atarax in the past and that just made her really drowsy.  She has also been on multiple SSRIs as well as Cymbalta and notes those things made her feel depressed.  COVID-19 exposure: Patient notes there was a child at her church daycare that developed symptoms of lack of taste and smell as well as respiratory symptoms.  The child has been tested for Covid though she does not know the result.  The child's mother tested positive.  The patient has been fully vaccinated with receiving the [employer] vaccine in September.  She has not had any symptoms of COVID-19.  The patient and the child both had masks on.   ROS: See pertinent positives and negatives per HPI.  Past Medical History:  Diagnosis Date  . Arthritis   . Chest discomfort    a. associated with palpitations.  . Depression   . Dyspnea on exertion    a. 12/2016 Echo: EF 55-60%, no rwma, nl PASP.  Marland Kitchen Juvenile rheumatoid arthritis ([place])   . Meniere disease   . Migraines   . Neuropathy   . Palpitations   . PVC's (premature ventricular contractions)    a. 01/2017 48h Holter: Rare PAC/PVC. Freq runs of sinus tachycardia.    Past Surgical History:  Procedure Laterality Date  . KNEE  SURGERY Right 1997, 1997   torn meniscus, ACL replacement    . REFRACTIVE SURGERY     lasix    Family History  Problem Relation Age of Onset  . Diabetes Mother   . Arthritis Father   . Other Father        atrial flutter  . Emphysema Father        smoker  . Diabetes Maternal Aunt   . Diabetes Maternal Uncle   . Stroke Paternal Aunt   . Arthritis Maternal Grandmother   . Diabetes Maternal Grandmother   . Cancer Maternal Grandmother        skin  . Arthritis Paternal Grandmother   . Cancer Paternal Grandmother        skin    SOCIAL HX:  Non-smoker   Current Outpatient Medications:  .  aspirin-acetaminophen-caffeine (EXCEDRIN MIGRAINE) 250-250-65 MG tablet, Take by mouth daily., Disp: , Rfl:  .  ibuprofen (ADVIL) 800 MG tablet, Take 1 tablet (800 mg total) by mouth 3 (three) times daily., Disp: 21 tablet, Rfl: 0 .  SUMAtriptan (IMITREX) 50 MG tablet, TAKE 1 TABLET BY MOUTH ONCE AS NEEDED FOR MIGRAINE FOR UP TO 1 DOSE, Disp: 27 tablet, Rfl: 2 .  traMADol (ULTRAM) 50 MG tablet, TAKE 1 TABLET BY MOUTH EVERY 12 HOURS AS NEEDED FOR SEVERE PAIN., Disp: 15 tablet, Rfl: 0 .  busPIRone (BUSPAR) 7.5 MG tablet, Take 1 tablet (7.5 mg total) by mouth 2 (two) times daily., Disp: 60 tablet, Rfl: 3 .  clonazePAM (KLONOPIN) 0.5 MG tablet, Take 0.5 tablets (0.25 mg total) by mouth 2 (two) times daily as needed for anxiety., Disp: 10 tablet, Rfl: 0 .  medroxyPROGESTERone Acetate 150 MG/ML SUSY, INJECT 1 ML INTO THE MUSCLE EVERY 3 MONTHS. (Patient not taking: Reported on 02/13/2020), Disp: 1 mL, Rfl: 3  EXAM:  VITALS per patient if applicable:  GENERAL: alert, oriented, appears well and in no acute distress  HEENT: atraumatic, conjunttiva clear, no obvious abnormalities on inspection of external nose and ears  NECK: normal movements of the head and neck  LUNGS: on inspection no signs of respiratory distress, breathing rate appears normal, no obvious gross SOB, gasping or wheezing  CV: no obvious cyanosis  MS: moves all visible extremities without noticeable abnormality  PSYCH/NEURO: pleasant and cooperative, no obvious depression or anxiety, speech and thought processing grossly intact  ASSESSMENT AND PLAN:  Discussed the following assessment and plan:  Problem List Items Addressed This Visit    Anxiety and depression    No depression though does note anxiety.  She has tried multiple SSRIs and Cymbalta in the past with worsening symptoms.  We will trial BuSpar.  Discussed it could take 1 to 2 months for her to notice any difference  with this.  I will provide her with a small number of clonazepam to take on a as needed basis for the panic attacks.  Discussed risk of dependence, addiction, and drowsiness with this medication.  Advised not to drive if she is drowsy.  If she has side effects from these medication she will let us know.  Advised not to take the clonazepam and tramadol together.  Discussed not drinking any alcohol when she is taking the clonazepam.  Controlled substance database reviewed.      Relevant Medications   busPIRone (BUSPAR) 7.5 MG tablet   clonazePAM (KLONOPIN) 0.5 MG tablet   Chronic hip pain, left    Continues to have hip pain.  Suspect related to gluteus tendinitis  as diagnosed by Dr. Oneida Alar.  I encouraged the patient to try the Arnica gel to see if that would be beneficial.  She can continue the tramadol sparingly.  I am going to send a message to Dr. Eden Lathe to see what he thinks the next step would be for the patient as the exercises she did at home were not beneficial.      Relevant Medications   clonazePAM (KLONOPIN) 0.5 MG tablet   Exposure to COVID-19 virus    Patient with likely exposure to a child at her church's daycare.  She is asymptomatic and has been fully vaccinated.  She does not need to quarantine.  She will get tested and quarantine if she develops symptoms.  I did encourage her to contact health at work to make sure they do not have any other advice regarding this.          I discussed the assessment and treatment plan with the patient. The patient was provided an opportunity to ask questions and all were answered. The patient agreed with the plan and demonstrated an understanding of the instructions.   The patient was advised to call back or seek an in-person evaluation if the symptoms worsen or if the condition fails to improve as anticipated.  Tommi Rumps, MD

## 2020-03-01 ENCOUNTER — Other Ambulatory Visit: Payer: Self-pay | Admitting: Family Medicine

## 2020-03-30 ENCOUNTER — Other Ambulatory Visit: Payer: Self-pay

## 2020-03-30 ENCOUNTER — Encounter: Payer: Self-pay | Admitting: Family Medicine

## 2020-03-30 ENCOUNTER — Telehealth (INDEPENDENT_AMBULATORY_CARE_PROVIDER_SITE_OTHER): Payer: 59 | Admitting: Family Medicine

## 2020-03-30 ENCOUNTER — Other Ambulatory Visit: Payer: Self-pay | Admitting: Family Medicine

## 2020-03-30 ENCOUNTER — Telehealth: Payer: 59 | Admitting: Family Medicine

## 2020-03-30 DIAGNOSIS — M7602 Gluteal tendinitis, left hip: Secondary | ICD-10-CM

## 2020-03-30 DIAGNOSIS — F32A Depression, unspecified: Secondary | ICD-10-CM

## 2020-03-30 DIAGNOSIS — F419 Anxiety disorder, unspecified: Secondary | ICD-10-CM

## 2020-03-30 MED ORDER — BACLOFEN 10 MG PO TABS: 10.0000 mg | ORAL_TABLET | Freq: Three times a day (TID) | ORAL | 0 refills | Status: DC | PRN

## 2020-03-30 NOTE — Progress Notes (Signed)
Virtual Visit via video Note  This visit type was conducted due to national recommendations for restrictions regarding the COVID-19 pandemic (e.g. social distancing).  This format is felt to be most appropriate for this patient at this time.  All issues noted in this document were discussed and addressed.  No physical exam was performed (except for noted visual exam findings with Video Visits).   I connected with Sarah Gallagher today at 11:30 AM EST by a video enabled telemedicine application or telephone and verified that I am speaking with the correct person using two identifiers. Location patient: home Location provider: home office Persons participating in the virtual visit: patient, provider  I discussed the limitations, risks, security and privacy concerns of performing an evaluation and management service by telephone and the availability of in person appointments. I also discussed with the patient that there may be a patient responsible charge related to this service. The patient expressed understanding and agreed to proceed.  Reason for visit: f/u.  HPI: Anxiety: Patient notes this is better.  She has been taking the BuSpar only once a day.  She has only required the clonazepam once since our last visit.  She notes the panic symptoms and sensation of chest discomfort has improved significantly with the BuSpar.  She is working weekends which is a different pace though notes she does work on Mondays and that is difficult for her as she does not get along with some of the people she works with.  Chronic hip and back pain: This is an ongoing issue.  She notes that it is relatively stable.  She had quite a bit of pain this past week.  She has tried multiple treatments and has done exercises for this with little benefit.  She notes previously she would take baclofen on an as-needed basis only at night if her pain was significant.   ROS: See pertinent positives and negatives per HPI.  Past  Medical History:  Diagnosis Date  . Arthritis   . Chest discomfort    a. associated with palpitations.  . Depression   . Dyspnea on exertion    a. 12/2016 Echo: EF 55-60%, no rwma, nl PASP.  Marland Kitchen Juvenile rheumatoid arthritis ([facility])   . Meniere disease   . Migraines   . Neuropathy   . Palpitations   . PVC's (premature ventricular contractions)    a. 01/2017 48h Holter: Rare PAC/PVC. Freq runs of sinus tachycardia.    Past Surgical History:  Procedure Laterality Date  . KNEE SURGERY Right 1997, 1997   torn meniscus, ACL replacement    . REFRACTIVE SURGERY     lasix    Family History  Problem Relation Age of Onset  . Diabetes Mother   . Arthritis Father   . Other Father        atrial flutter  . Emphysema Father        smoker  . Diabetes Maternal Aunt   . Diabetes Maternal Uncle   . Stroke Paternal Aunt   . Arthritis Maternal Grandmother   . Diabetes Maternal Grandmother   . Cancer Maternal Grandmother        skin  . Arthritis Paternal Grandmother   . Cancer Paternal Grandmother        skin    SOCIAL HX: Non-smoker   Current Outpatient Medications:  .  aspirin-acetaminophen-caffeine (EXCEDRIN MIGRAINE) 250-250-65 MG tablet, Take by mouth daily., Disp: , Rfl:  .  baclofen (LIORESAL) 10 MG tablet, Take 1 tablet (10  mg total) by mouth 3 (three) times daily as needed for muscle spasms., Disp: 30 each, Rfl: 0 .  busPIRone (BUSPAR) 7.5 MG tablet, Take 1 tablet (7.5 mg total) by mouth 2 (two) times daily., Disp: 60 tablet, Rfl: 3 .  clonazePAM (KLONOPIN) 0.5 MG tablet, Take 0.5 tablets (0.25 mg total) by mouth 2 (two) times daily as needed for anxiety., Disp: 10 tablet, Rfl: 0 .  ibuprofen (ADVIL) 800 MG tablet, Take 1 tablet (800 mg total) by mouth 3 (three) times daily., Disp: 21 tablet, Rfl: 0 .  medroxyPROGESTERone Acetate 150 MG/ML SUSY, INJECT 1 ML INTO THE MUSCLE EVERY 3 MONTHS., Disp: 1 mL, Rfl: 3 .  SUMAtriptan (IMITREX) 50 MG tablet, TAKE 1 TABLET BY MOUTH ONCE AS  NEEDED FOR MIGRAINE FOR UP TO 1 DOSE, Disp: 27 tablet, Rfl: 2 .  traMADol (ULTRAM) 50 MG tablet, TAKE 1 TABLET BY MOUTH EVERY 12 HOURS AS NEEDED FOR SEVERE PAIN., Disp: 15 tablet, Rfl: 0  EXAM:  VITALS per patient if applicable:  GENERAL: alert, oriented, appears well and in no acute distress  HEENT: atraumatic, conjunttiva clear, no obvious abnormalities on inspection of external nose and ears  NECK: normal movements of the head and neck  LUNGS: on inspection no signs of respiratory distress, breathing rate appears normal, no obvious gross SOB, gasping or wheezing  CV: no obvious cyanosis  MS: moves all visible extremities without noticeable abnormality  PSYCH/NEURO: pleasant and cooperative, no obvious depression or anxiety, speech and thought processing grossly intact  ASSESSMENT AND PLAN:  Discussed the following assessment and plan:  Problem List Items Addressed This Visit    Anxiety and depression    This has improved.  Though the patient has only been taking the BuSpar once a day.  I encouraged her to take this twice daily as she will likely get more benefit from it.  She can continue as needed clonazepam.  Follow-up in 3 months.      Gluteal tendinitis of left buttock    The patient continues to have issues with this and her low back pain.  Will prescribe baclofen for her to take on an as-needed basis.  She will monitor for drowsiness with this.  I will also send a message to her sports medicine physician to see what they think the next step might be.      Relevant Medications   baclofen (LIORESAL) 10 MG tablet       I discussed the assessment and treatment plan with the patient. The patient was provided an opportunity to ask questions and all were answered. The patient agreed with the plan and demonstrated an understanding of the instructions.   The patient was advised to call back or seek an in-person evaluation if the symptoms worsen or if the condition fails to  improve as anticipated.   Tommi Rumps, MD

## 2020-03-30 NOTE — Assessment & Plan Note (Signed)
This has improved.  Though the patient has only been taking the BuSpar once a day.  I encouraged her to take this twice daily as she will likely get more benefit from it.  She can continue as needed clonazepam.  Follow-up in 3 months.

## 2020-03-30 NOTE — Assessment & Plan Note (Addendum)
The patient continues to have issues with this and her low back pain.  Will prescribe baclofen for her to take on an as-needed basis.  She will monitor for drowsiness with this.  I will also send a message to her sports medicine physician to see what they think the next step might be.

## 2020-03-31 ENCOUNTER — Other Ambulatory Visit: Payer: Self-pay | Admitting: Family Medicine

## 2020-04-28 ENCOUNTER — Telehealth: Payer: Self-pay | Admitting: Family Medicine

## 2020-04-28 DIAGNOSIS — G8929 Other chronic pain: Secondary | ICD-10-CM

## 2020-04-28 DIAGNOSIS — M25552 Pain in left hip: Secondary | ICD-10-CM

## 2020-04-28 NOTE — Telephone Encounter (Signed)
Please let the patient know that I heard back from Dr. Oneida Alar recently.  He noted he would be happy to see her again.  He noted they were starting to use soundwave therapy that may be able to help with her discomfort.  We could try to get her set up with an appointment with him if she is willing.

## 2020-04-28 NOTE — Telephone Encounter (Signed)
-----   Message from Stefanie Libel, MD sent at 04/27/2020  4:48 PM EST ----- Randall Hiss,  I would be happy to see her again.  We are just starting to use some sound wave therapy for chronic tendon and muscle pain.  I think the new unit will be up and running in early February.  Happy to reevaluate and see what seems reasonable. Sorry to be slow. Was out with Covid for a couple of weeks.  bert ----- Message ----- From: Leone Haven, MD Sent: 03/30/2020  12:03 PM EST To: Stefanie Libel, MD  Hi Dr Oneida Alar,   I saw Mrs Kimbley to day for follow-up. She has continued to have gluteal pain and back pain since her visit with you. She did work on the exercises you discussed though noted those did not help. I was wondering what you thought might be the next step for her. I did discuss having her follow-up with you so we could arrange that if you would like. Thanks for your help.   Randall Hiss

## 2020-04-28 NOTE — Telephone Encounter (Signed)
Called and spoke with [place]. She is agreeable to see Dr. Oneida Alar again.

## 2020-04-29 NOTE — Telephone Encounter (Signed)
Referral placed.

## 2020-04-29 NOTE — Addendum Note (Signed)
Addended by: Leone Haven on: 04/29/2020 09:02 AM   Modules accepted: Orders

## 2020-05-03 ENCOUNTER — Other Ambulatory Visit: Payer: Self-pay | Admitting: Family Medicine

## 2020-05-20 ENCOUNTER — Ambulatory Visit: Payer: 59 | Admitting: Sports Medicine

## 2020-05-21 ENCOUNTER — Encounter: Payer: Self-pay | Admitting: Obstetrics and Gynecology

## 2020-05-21 ENCOUNTER — Ambulatory Visit (INDEPENDENT_AMBULATORY_CARE_PROVIDER_SITE_OTHER): Payer: 59 | Admitting: Obstetrics and Gynecology

## 2020-05-21 ENCOUNTER — Other Ambulatory Visit: Payer: Self-pay

## 2020-05-21 VITALS — BP 120/70 | Ht 66.0 in | Wt 189.0 lb

## 2020-05-21 DIAGNOSIS — Z0000 Encounter for general adult medical examination without abnormal findings: Secondary | ICD-10-CM

## 2020-05-21 DIAGNOSIS — Z124 Encounter for screening for malignant neoplasm of cervix: Secondary | ICD-10-CM

## 2020-05-21 DIAGNOSIS — Z01419 Encounter for gynecological examination (general) (routine) without abnormal findings: Secondary | ICD-10-CM

## 2020-05-21 DIAGNOSIS — Z1239 Encounter for other screening for malignant neoplasm of breast: Secondary | ICD-10-CM | POA: Diagnosis not present

## 2020-05-21 DIAGNOSIS — Z3009 Encounter for other general counseling and advice on contraception: Secondary | ICD-10-CM | POA: Diagnosis not present

## 2020-05-21 DIAGNOSIS — Z1231 Encounter for screening mammogram for malignant neoplasm of breast: Secondary | ICD-10-CM

## 2020-05-21 NOTE — Progress Notes (Signed)
Gynecology Annual Exam  PCP: Leone Haven, MD  Chief Complaint:  Chief Complaint  Patient presents with  . Routine Prenatal Visit    History of Present Illness: Patient is a 40 y.o. E3O1224 presents for annual exam. The patient has no complaints today.   LMP: No LMP recorded. Patient has had an injection. Discontinued Depo Provera 3 months ago  The patient does perform self breast exams.  There is no notable family history of breast or ovarian cancer in her family.  The patient has regular exercise: no, reports none  The patient denies current symptoms of depression.   PHQ-9: 2 GAD-7: 1   Review of Systems: Review of Systems  Constitutional: Negative for chills, fever, malaise/fatigue and weight loss.  HENT: Negative for congestion, hearing loss and sinus pain.   Eyes: Negative for blurred vision and double vision.  Respiratory: Negative for cough, sputum production, shortness of breath and wheezing.   Cardiovascular: Negative for chest pain, palpitations, orthopnea and leg swelling.  Gastrointestinal: Negative for abdominal pain, constipation, diarrhea, nausea and vomiting.  Genitourinary: Negative for dysuria, flank pain, frequency, hematuria and urgency.  Musculoskeletal: Negative for back pain, falls and joint pain.  Skin: Negative for itching and rash.  Neurological: Negative for dizziness and headaches.  Psychiatric/Behavioral: Negative for depression, substance abuse and suicidal ideas. The patient is not nervous/anxious.     Past Medical History:  Past Medical History:  Diagnosis Date  . Arthritis   . Chest discomfort    a. associated with palpitations.  . Depression   . Dyspnea on exertion    a. 12/2016 Echo: EF 55-60%, no rwma, nl PASP.  Marland Kitchen Juvenile rheumatoid arthritis ([place])   . Meniere disease   . Migraines   . Neuropathy   . Palpitations   . PVC's (premature ventricular contractions)    a. 01/2017 48h Holter: Rare PAC/PVC. Freq runs of sinus  tachycardia.    Past Surgical History:  Past Surgical History:  Procedure Laterality Date  . KNEE SURGERY Right 1997, 1997   torn meniscus, ACL replacement    . REFRACTIVE SURGERY     lasix    Gynecologic History:  No LMP recorded. Patient has had an injection.  History of fibroids, polyps, or ovarian cysts? : Denies   History of PCOS? denies Hstory of Endometriosis? denies History of abnormal pap smears? Yes Have you had any sexually transmitted infections in the past? - no answer  Last Pap: Results were: 05/23/2019 NIL and HR HPV negative    She identifies as a female. She is sexually active with men.   She denies dyspareunia. She denies postcoital bleeding.    Obstetric History: [id number]  Family History:  Family History  Problem Relation Age of Onset  . Diabetes Mother   . Arthritis Father   . Other Father        atrial flutter  . Emphysema Father        smoker  . Diabetes Maternal Aunt   . Diabetes Maternal Uncle   . Stroke Paternal Aunt   . Arthritis Maternal Grandmother   . Diabetes Maternal Grandmother   . Cancer Maternal Grandmother        skin  . Arthritis Paternal Grandmother   . Cancer Paternal Grandmother        skin    Social History:  Social History   Socioeconomic History  . Marital status: Married    Spouse name: Not on file  . Number of children:  3  . Years of education: BS  . Highest education level: Not on file  Occupational History    Comment: [employer], Resp Therapist  Tobacco Use  . Smoking status: Never Smoker  . Smokeless tobacco: Never Used  Vaping Use  . Vaping Use: Never used  Substance and Sexual Activity  . Alcohol use: Not Currently    Alcohol/week: 0.0 standard drinks    Comment: rare  . Drug use: No  . Sexual activity: Yes    Birth control/protection: Injection  Other Topics Concern  . Not on file  Social History Narrative   Lives in [place] with husband and children.  Not currently exercising.   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   Caffeine 2-3 cups daily   Social Determinants of Health   Financial Resource Strain: Not on file  Food Insecurity: Not on file  Transportation Needs: Not on file  Physical Activity: Not on file  Stress: Not on file  Social Connections: Not on file  Intimate Partner Violence: Not on file    Allergies:  Allergies  Allergen Reactions  . Zithromax [Azithromycin] Other (See Comments)    "convulsions or tremors"    Medications: Prior to Admission medications   Medication Sig Start Date End Date Taking? Authorizing Provider  aspirin-acetaminophen-caffeine (EXCEDRIN MIGRAINE) [phone] MG tablet Take by mouth daily.   Yes [provider]  baclofen (LIORESAL) 10 MG tablet Take 1 tablet (10 mg total) by mouth 3 (three) times daily as needed for muscle spasms. 03/30/20  Yes Leone Haven, MD  busPIRone (BUSPAR) 7.5 MG tablet Take 1 tablet (7.5 mg total) by mouth 2 (two) times daily. 02/13/20  Yes Leone Haven, MD  clonazePAM (KLONOPIN) 0.5 MG tablet Take 0.5 tablets (0.25 mg total) by mouth 2 (two) times daily as needed for anxiety. 02/13/20  Yes Leone Haven, MD  SUMAtriptan (IMITREX) 50 MG tablet TAKE 1 TABLET BY MOUTH ONCE AS NEEDED FOR MIGRAINE FOR UP TO 1 DOSE 02/09/20  Yes Leone Haven, MD  traMADol (ULTRAM) 50 MG tablet TAKE 1 TABLET BY MOUTH EVERY 12 HOURS AS NEEDED FOR SEVERE PAIN. 05/03/20  Yes Leone Haven, MD  ibuprofen (ADVIL) 800 MG tablet Take 1 tablet (800 mg total) by mouth 3 (three) times daily. Patient not taking: Reported on 05/21/2020 08/21/19   Wurst, [place], PA-C  medroxyPROGESTERone Acetate 150 MG/ML SUSY INJECT 1 ML INTO THE MUSCLE EVERY 3 MONTHS. Patient not taking: Reported on 05/21/2020 02/12/20   Homero Fellers, MD    Physical Exam Vitals: Blood pressure 120/70, height 5\' 6"  (1.676 m), weight 189 lb (85.7 kg).  Physical Exam Constitutional:      Appearance: She is well-developed.   Genitourinary:     Vagina and uterus normal.     There is no lesion on the right labia.     There is no lesion on the left labia.    No lesions in the vagina.     Genitourinary Comments: External: Normal appearing vulva. No lesions noted.  Bimanual examination: Uterus midline, non-tender, normal in size, shape and contour.  No CMT. No adnexal masses. No adnexal tenderness. Pelvis not fixed.       Right Adnexa: no mass present.    Left Adnexa: no mass present.    No cervical motion tenderness.  Breasts:     Right: No inverted nipple, mass, nipple discharge or skin change.     Left: No inverted nipple, mass, nipple discharge or  skin change.    HENT:     Head: Normocephalic and atraumatic.  Eyes:     Extraocular Movements: EOM normal.  Neck:     Thyroid: No thyromegaly.  Cardiovascular:     Rate and Rhythm: Normal rate and regular rhythm.     Heart sounds: Normal heart sounds.  Pulmonary:     Effort: Pulmonary effort is normal.     Breath sounds: Normal breath sounds.  Abdominal:     General: Bowel sounds are normal. There is no distension.     Palpations: Abdomen is soft. There is no mass.  Musculoskeletal:     Cervical back: Neck supple.  Neurological:     Mental Status: She is alert and oriented to person, place, and time.  Skin:    General: Skin is warm and dry.  Psychiatric:        Mood and Affect: Mood and affect normal.        Behavior: Behavior normal.        Thought Content: Thought content normal.        Judgment: Judgment normal.  Vitals reviewed.      Female chaperone present for pelvic and breast  portions of the physical exam  Assessment: 40 y.o. G3P3003 routine annual exam  Plan: Problem List Items Addressed This Visit   None   Visit Diagnoses    Encounter for annual routine gynecological examination    -  Primary   Health maintenance examination       Breast cancer screening by mammogram       Relevant Orders   MM 3D SCREEN BREAST BILATERAL    Cervical cancer screening       Encounter for gynecological examination without abnormal finding       Encounter for screening breast examination       Birth control counseling          1) Mammogram - recommend yearly screening mammogram.  Mammogram Was ordered today  2) STI screening was offered and declined  3) ASCCP guidelines and rational discussed.  Patient opts for every 5 years screening interval  4) Contraception - husband has had a vasectomy. May want to restart depo Provera in the future.   5) Colonoscopy -- start at 76  6) Routine healthcare maintenance including cholesterol, diabetes screening discussed managed by PCP  Adrian Prows MD, [place], [facility] 05/21/2020 8:50 AM

## 2020-05-21 NOTE — Patient Instructions (Signed)
Institute of Medicine Recommended Dietary Allowances for Calcium and Vitamin D  Age (yr) Calcium Recommended Dietary Allowance (mg/day) Vitamin D Recommended Dietary Allowance (international units/day)  9-18 1,300 600  19-50 1,000 600  51-70 1,200 600  71 and older 1,200 800  Data from [employer] of [employer]. Dietary reference intakes: calcium, vitamin D. [place], [place]: National Academies Press; 2011.    Exercising to Stay Healthy To become healthy and stay healthy, it is recommended that you do moderate-intensity and vigorous-intensity exercise. You can tell that you are exercising at a moderate intensity if your heart starts beating faster and you start breathing faster but can still hold a conversation. You can tell that you are exercising at a vigorous intensity if you are breathing much harder and faster and cannot hold a conversation while exercising. Exercising regularly is important. It has many health benefits, such as:  Improving overall fitness, flexibility, and endurance.  Increasing bone density.  Helping with weight control.  Decreasing body fat.  Increasing muscle strength.  Reducing stress and tension.  Improving overall health. How often should I exercise? Choose an activity that you enjoy, and set realistic goals. Your health care provider can help you make an activity plan that works for you. Exercise regularly as told by your health care provider. This may include:  Doing strength training two times a week, such as: ? Lifting weights. ? Using resistance bands. ? Push-ups. ? Sit-ups. ? Yoga.  Doing a certain intensity of exercise for a given amount of time. Choose from these options: ? A total of 150 minutes of moderate-intensity exercise every week. ? A total of 75 minutes of vigorous-intensity exercise every week. ? A mix of moderate-intensity and vigorous-intensity exercise every week. Children, pregnant women, people who have not exercised  regularly, people who are overweight, and older adults may need to talk with a health care provider about what activities are safe to do. If you have a medical condition, be sure to talk with your health care provider before you start a new exercise program. What are some exercise ideas? Moderate-intensity exercise ideas include:  Walking 1 mile (1.6 km) in about 15 minutes.  Biking.  Hiking.  Golfing.  Dancing.  Water aerobics. Vigorous-intensity exercise ideas include:  Walking 4.5 miles (7.2 km) or more in about 1 hour.  Jogging or running 5 miles (8 km) in about 1 hour.  Biking 10 miles (16.1 km) or more in about 1 hour.  Lap swimming.  Roller-skating or in-line skating.  Cross-country skiing.  Vigorous competitive sports, such as football, basketball, and soccer.  Jumping rope.  Aerobic dancing.   What are some everyday activities that can help me to get exercise?  Yard work, such as: ? Pushing a lawn mower. ? Raking and bagging leaves.  Washing your car.  Pushing a stroller.  Shoveling snow.  Gardening.  Washing windows or floors. How can I be more active in my day-to-day activities?  Use stairs instead of an elevator.  Take a walk during your lunch break.  If you drive, park your car farther away from your work or school.  If you take public transportation, get off one stop early and walk the rest of the way.  Stand up or walk around during all of your indoor phone calls.  Get up, stretch, and walk around every 30 minutes throughout the day.  Enjoy exercise with a friend. Support to continue exercising will help you keep a regular routine of activity. What guidelines   can I follow while exercising?  Before you start a new exercise program, talk with your health care provider.  Do not exercise so much that you hurt yourself, feel dizzy, or get very short of breath.  Wear comfortable clothes and wear shoes with good support.  Drink plenty of  water while you exercise to prevent dehydration or heat stroke.  Work out until your breathing and your heartbeat get faster. Where to find more information  [place] Department of Health and Human Services: www.hhs.gov  Centers for Disease Control and Prevention (CDC): www.cdc.gov Summary  Exercising regularly is important. It will improve your overall fitness, flexibility, and endurance.  Regular exercise also will improve your overall health. It can help you control your weight, reduce stress, and improve your bone density.  Do not exercise so much that you hurt yourself, feel dizzy, or get very short of breath.  Before you start a new exercise program, talk with your health care provider. This information is not intended to replace advice given to you by your health care provider. Make sure you discuss any questions you have with your health care provider. Document Revised: 03/02/2017 Document Reviewed: 02/08/2017 Elsevier Patient Education  2021 Elsevier Inc.   Budget-Friendly Healthy Eating There are many ways to save money at the grocery store and continue to eat healthy. You can be successful if you:  Plan meals according to your budget.  Make a grocery list and only purchase food according to your grocery list.  Prepare food yourself at home. What are tips for following this plan? Reading food labels  Compare food labels between brand name foods and the store brand. Often the nutritional value is the same, but the store brand is lower cost.  Look for products that do not have added sugar, fat, or salt (sodium). These often cost the same but are healthier for you. Products may be labeled as: ? Sugar-free. ? Nonfat. ? Low-fat. ? Sodium-free. ? Low-sodium.  Look for lean ground beef labeled as at least 92% lean and 8% fat. Shopping  Buy only the items on your grocery list and go only to the areas of the store that have the items on your list.  Use coupons only for  foods and brands you normally buy. Avoid buying items you wouldn't normally buy simply because they are on sale.  Check online and in newspapers for weekly deals.  Buy healthy items from the bulk bins when available, such as herbs, spices, flour, pasta, nuts, and dried fruit.  Buy fruits and vegetables that are in season. Prices are usually lower on in-season produce.  Look at the unit price on the price tag. Use it to compare different brands and sizes to find out which item is the best deal.  Choose healthy items that are often low-cost, such as carrots, potatoes, apples, bananas, and oranges. Dried or canned beans are a low-cost protein source.  Buy in bulk and freeze extra food. Items you can buy in bulk include meats, fish, poultry, frozen fruits, and frozen vegetables.  Avoid buying "ready-to-eat" foods, such as pre-cut fruits and vegetables and pre-made salads.  If possible, shop around to discover where you can find the best prices. Consider other retailers such as dollar stores, larger wholesale stores, local fruit and vegetable stands, and farmers markets.  Do not shop when you are hungry. If you shop while hungry, it may be hard to stick to your list and budget.  Resist impulse buying. Use your grocery   list as your official plan for the week.  Buy a variety of vegetables and fruits by purchasing fresh, frozen, and canned items.  Look at the top and bottom shelves for deals. Foods at eye level (eye level of an adult or child) are usually more expensive.  Be efficient with your time when shopping. The more time you spend at the store, the more money you are likely to spend.  To save money when choosing more expensive foods like meats and dairy: ? Choose cheaper cuts of meat, such as bone-in chicken thighs and drumsticks instead of skinless and boneless chicken. When you are ready to prepare the chicken, you can remove the skin yourself to make it healthier. ? Choose lean meats  like chicken or turkey instead of beef. ? Choose canned seafood, such as tuna, salmon, or sardines. ? Buy eggs as a low-cost source of protein. ? Buy dried beans and peas, such as lentils, split peas, or kidney beans instead of meats. Dried beans and peas are a good alternative source of protein. ? Buy the larger tubs of yogurt instead of individual-sized containers.  Choose water instead of sodas and other sweetened beverages.  Avoid buying chips, cookies, and other "junk food." These items are usually expensive and not healthy.   Cooking  Make extra food and freeze the extras in meal-sized containers or in individual portions for fast meals and snacks.  Pre-cook on days when you have extra time to prepare meals in advance. You can keep these meals in the fridge or freezer and reheat for a quick meal.  When you come home from the grocery store, wash, peel, and cut fruits and vegetables so they are ready to use and eat. This will help reduce food waste. Meal planning  Do not eat out or get fast food. Prepare food at home.  Make a grocery list and make sure to bring it with you to the store. If you have a smart phone, you could use your phone to create your shopping list.  Plan meals and snacks according to a grocery list and budget you create.  Use leftovers in your meal plan for the week.  Look for recipes where you can cook once and make enough food for two meals.  Prepare budget-friendly types of meals like stews, casseroles, and stir-fry dishes.  Try some meatless meals or try "no cook" meals like salads.  Make sure that half your plate is filled with fruits or vegetables. Choose from fresh, frozen, or canned fruits and vegetables. If eating canned, remember to rinse them before eating. This will remove any excess salt added for packaging. Summary  Eating healthy on a budget is possible if you plan your meals according to your budget, purchase according to your budget and  grocery list, and prepare food yourself.  Tips for buying more food on a limited budget include buying generic brands, using coupons only for foods you normally buy, and buying healthy items from the bulk bins when available.  Tips for buying cheaper food to replace expensive food include choosing cheaper, lean cuts of meat, and buying dried beans and peas. This information is not intended to replace advice given to you by your health care provider. Make sure you discuss any questions you have with your health care provider. Document Revised: 01/01/2020 Document Reviewed: 01/01/2020 Elsevier Patient Education  2021 Elsevier Inc.   Bone Health Bones protect organs, store calcium, anchor muscles, and support the whole body. Keeping your bones   strong is important, especially as you get older. You can take actions to help keep your bones strong and healthy. Why is keeping my bones healthy important? Keeping your bones healthy is important because your body constantly replaces bone cells. Cells get old, and new cells take their place. As we age, we lose bone cells because the body may not be able to make enough new cells to replace the old cells. The amount of bone cells and bone tissue you have is referred to as bone mass. The higher your bone mass, the stronger your bones. The aging process leads to an overall loss of bone mass in the body, which can increase the likelihood of:  Joint pain and stiffness.  Broken bones.  A condition in which the bones become weak and brittle (osteoporosis). A large decline in bone mass occurs in older adults. In women, it occurs about the time of menopause.   What actions can I take to keep my bones healthy? Good health habits are important for maintaining healthy bones. This includes eating nutritious foods and exercising regularly. To have healthy bones, you need to get enough of the right minerals and vitamins. Most nutrition experts recommend getting these  nutrients from the foods that you eat. In some cases, taking supplements may also be recommended. Doing certain types of exercise is also important for bone health. What are the nutritional recommendations for healthy bones? Eating a well-balanced diet with plenty of calcium and vitamin D will help to protect your bones. Nutritional recommendations vary from person to person. Ask your health care provider what is healthy for you. Here are some general guidelines. Get enough calcium Calcium is the most important (essential) mineral for bone health. Most people can get enough calcium from their diet, but supplements may be recommended for people who are at risk for osteoporosis. Good sources of calcium include:  Dairy products, such as low-fat or nonfat milk, cheese, and yogurt.  Dark green leafy vegetables, such as bok choy and broccoli.  Calcium-fortified foods, such as orange juice, cereal, bread, soy beverages, and tofu products.  Nuts, such as almonds. Follow these recommended amounts for daily calcium intake:  Children, age 1-3: 700 mg.  Children, age 4-8: 1,000 mg.  Children, age 9-13: 1,300 mg.  Teens, age 14-18: 1,300 mg.  Adults, age 19-50: 1,000 mg.  Adults, age 51-70: ? Men: 1,000 mg. ? Women: 1,200 mg.  Adults, age 71 or older: 1,200 mg.  Pregnant and breastfeeding females: ? Teens: 1,300 mg. ? Adults: 1,000 mg. Get enough vitamin D Vitamin D is the most essential vitamin for bone health. It helps the body absorb calcium. Sunlight stimulates the skin to make vitamin D, so be sure to get enough sunlight. If you live in a cold climate or you do not get outside often, your health care provider may recommend that you take vitamin D supplements. Good sources of vitamin D in your diet include:  Egg yolks.  Saltwater fish.  Milk and cereal fortified with vitamin D. Follow these recommended amounts for daily vitamin D intake:  Children and teens, age 1-18: 600  international units.  Adults, age 50 or younger: 400-800 international units.  Adults, age 51 or older: 800-1,000 international units. Get other important nutrients Other nutrients that are important for bone health include:  Phosphorus. This mineral is found in meat, poultry, dairy foods, nuts, and legumes. The recommended daily intake for adult men and adult women is 700 mg.  Magnesium. This mineral   is found in seeds, nuts, dark green vegetables, and legumes. The recommended daily intake for adult men is 400-420 mg. For adult women, it is 310-320 mg.  Vitamin K. This vitamin is found in green leafy vegetables. The recommended daily intake is 120 mg for adult men and 90 mg for adult women.   What type of physical activity is best for building and maintaining healthy bones? Weight-bearing and strength-building activities are important for building and maintaining healthy bones. Weight-bearing activities cause muscles and bones to work against gravity. Strength-building activities increase the strength of the muscles that support bones. Weight-bearing and muscle-building activities include:  Walking and hiking.  Jogging and running.  Dancing.  Gym exercises.  Lifting weights.  Tennis and racquetball.  Climbing stairs.  Aerobics. Adults should get at least 30 minutes of moderate physical activity on most days. Children should get at least 60 minutes of moderate physical activity on most days. Ask your health care provider what type of exercise is best for you.   How can I find out if my bone mass is low? Bone mass can be measured with an X-ray test called a bone mineral density (BMD) test. This test is recommended for all women who are age 65 or older. It may also be recommended for:  Men who are age 70 or older.  People who are at risk for osteoporosis because of: ? Having bones that break easily. ? Having a long-term disease that weakens bones, such as kidney disease or  rheumatoid arthritis. ? Having menopause earlier than normal. ? Taking medicine that weakens bones, such as steroids, thyroid hormones, or hormone treatment for breast cancer or prostate cancer. ? Smoking. ? Drinking three or more alcoholic drinks a day. If you find that you have a low bone mass, you may be able to prevent osteoporosis or further bone loss by changing your diet and lifestyle. Where can I find more information? For more information, check out the following websites:  National Osteoporosis Foundation: www.nof.org/patients  National Institutes of Health: www.bones.nih.gov  International Osteoporosis Foundation: www.iofbonehealth.org Summary  The aging process leads to an overall loss of bone mass in the body, which can increase the likelihood of broken bones and osteoporosis.  Eating a well-balanced diet with plenty of calcium and vitamin D will help to protect your bones.  Weight-bearing and strength-building activities are also important for building and maintaining strong bones.  Bone mass can be measured with an X-ray test called a bone mineral density (BMD) test. This information is not intended to replace advice given to you by your health care provider. Make sure you discuss any questions you have with your health care provider. Document Revised: 04/16/2017 Document Reviewed: 04/16/2017 Elsevier Patient Education  2021 Elsevier Inc.   

## 2020-06-03 ENCOUNTER — Other Ambulatory Visit: Payer: Self-pay

## 2020-06-03 ENCOUNTER — Ambulatory Visit: Payer: 59 | Admitting: Sports Medicine

## 2020-06-03 DIAGNOSIS — M7602 Gluteal tendinitis, left hip: Secondary | ICD-10-CM | POA: Diagnosis not present

## 2020-06-03 NOTE — Assessment & Plan Note (Signed)
Patient needs to be more consitent with HEP She has challenges at home and work now Given a series of 5 home exercises once daily  Given flexeril at [place] in 6   Wants to assess sholder and arm in the future

## 2020-06-03 NOTE — Patient Instructions (Signed)
It was great to see you today!  -Please do the exercises we showed you today for your hip/low back. -Follow up in a month and we can address your shoulder/elbow pain at that time. -Call in the meantime with any questions or concerns.

## 2020-06-03 NOTE — Progress Notes (Signed)
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

## 2020-06-04 ENCOUNTER — Other Ambulatory Visit: Payer: Self-pay | Admitting: Family Medicine

## 2020-06-07 ENCOUNTER — Other Ambulatory Visit: Payer: Self-pay | Admitting: Family Medicine

## 2020-06-11 ENCOUNTER — Other Ambulatory Visit: Payer: Self-pay | Admitting: Sports Medicine

## 2020-06-11 MED ORDER — CYCLOBENZAPRINE HCL 10 MG PO TABS: 10.0000 mg | ORAL_TABLET | Freq: Every evening | ORAL | 0 refills | Status: DC | PRN

## 2020-06-15 ENCOUNTER — Other Ambulatory Visit: Payer: Self-pay

## 2020-06-15 ENCOUNTER — Ambulatory Visit
Admission: RE | Admit: 2020-06-15 | Discharge: 2020-06-15 | Disposition: A | Discharge status: Home / Self Care | Payer: 59 | Source: Ambulatory Visit | Attending: Obstetrics and Gynecology | Admitting: Obstetrics and Gynecology

## 2020-06-15 DIAGNOSIS — Z1231 Encounter for screening mammogram for malignant neoplasm of breast: Secondary | ICD-10-CM | POA: Diagnosis not present

## 2020-07-08 ENCOUNTER — Ambulatory Visit: Payer: 59 | Admitting: Sports Medicine

## 2020-07-08 ENCOUNTER — Other Ambulatory Visit: Payer: Self-pay

## 2020-07-08 ENCOUNTER — Other Ambulatory Visit: Payer: Self-pay | Admitting: Family Medicine

## 2020-07-08 VITALS — BP 120/84 | Ht 65.5 in | Wt 190.0 lb

## 2020-07-08 DIAGNOSIS — G8929 Other chronic pain: Secondary | ICD-10-CM | POA: Diagnosis not present

## 2020-07-08 DIAGNOSIS — M545 Low back pain, unspecified: Secondary | ICD-10-CM | POA: Diagnosis not present

## 2020-07-08 DIAGNOSIS — M7552 Bursitis of left shoulder: Secondary | ICD-10-CM

## 2020-07-08 DIAGNOSIS — M7602 Gluteal tendinitis, left hip: Secondary | ICD-10-CM

## 2020-07-08 MED ORDER — METHYLPREDNISOLONE ACETATE 40 MG/ML IJ SUSP
40.0000 mg | Freq: Once | INTRAMUSCULAR | Status: AC
  Administered 2020-07-08: 40 mg via INTRA_ARTICULAR

## 2020-07-08 NOTE — Patient Instructions (Signed)
You had an injection today. Things to be aware of after injection are listed below:   . You may experience no significant improvement or even a slight worsening in your symptoms during the first 24 to 48 hours. After that we expect your symptoms to improve gradually over the next 2 weeks for the medicine to have its maximal effect. You should continue to have improvement out to 6 weeks after your injection. . We recommend icing the site of the injection for 20 minutes 1-2 times the day of your injection and as needed for pain over the following several days. . You may shower but no swimming, tub bath or Jacuzzi for 24 hours. . If your bandage falls off this does not need to be replaced. It is appropriate to remove the bandage after 4 hours. . You may resume light activities as tolerated.  It can take several weeks to see improvements following injection. If after 2 weeks you are continuing to have worsening symptoms, please call our office to discuss what the next appropriate actions should be including the potential for a return office visit or other diagnostic testing.  POSSIBLE PROCEDURE SIDE EFFECTS: The side effects of the injection are usually minimal, self-limited, and usually will resolve on their own. Common side effects that can occur over the first several days following injection include:  . Increased numbness or tingling . Worsening pain, stiffness, or slight weakness . Swelling or bruising at the injection site  If you are concerned, please feel free to contact the office with questions.   Please call our office immediately if you experience any of the following symptoms over the next 2 weeks as these can be signs of infection:  . Fever greater than 100.5F . Significant swelling at the injection site . Significant redness or drainage from the injection site  

## 2020-07-08 NOTE — Progress Notes (Signed)
Office Visit Note   Patient: Sarah Gallagher           Date of Birth: July 11, 1980           MRN: [id number] Visit Date: 07/08/2020 Requested by: Leone Haven, MD [address] [place],  [place] [address] PCP: Leone Haven, MD  Subjective: CC: Follow-up left hip pain, lower back pain, new concerns of left shoulder pain  HPI: 40 year old female presenting to clinic with multiple concerns today.  Patient with gluteal tendinitis on the left, for which she was previously prescribed strengthening exercises.  She states she does these exercises 3-4 times weekly, and is starting to notice a small improvement.  For example, she was able to play soccer with her children earlier this week, and did not experience any pain at the time.  Previously, she says she would never have been able to play. Per her back, she endorses pain on the lateral aspect of her back, extending from her lower ribs towards the sides of her abdomen.  She has difficulty feeling as though she can stretch this area, and thinks it is possibly due to her chronic hip pain.  She was not given any back rehabilitation exercises, and is curious to know if she can get some.  Denies any bowel or bladder dysfunction, no numbness or weakness in bilateral legs.  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  She endorses left shoulder pain starting approximately 1 month ago.  Pain is significantly worsened with lifting her arm to the side.  She is also noticed pain with full extension of her arm, and feels as though she is not able to reach terminal extension on the left.  Pain primarily focused on the lateral aspect of her shoulder.              ROS:   All other systems were reviewed and are negative.  Objective: Vital Signs: BP 120/84   Ht 5' 5.5" (1.664 m)   Wt 190 lb (86.2 kg)   BMI 31.14 kg/m  No flowsheet data found.   No flowsheet data found.  Physical Exam:  General:  Alert and  oriented, in no acute distress. Pulm:  Breathing unlabored. Psy:  Normal mood, congruent affect. Skin: Left shoulder and lower back without bruises, rashes, or erythema. Overlying skin intact.   Left shoulder Exam:  Inspection: Symmetric muscle mass, no atrophy or deformity, no scars.  Palpation: Endorses tenderness to palpation over the lateral deltoids, as well as the subacromial space.  Tenderness at the distal biceps insertion.  No tenderness at the proximal long head of the biceps.  No tenderness over the [facility] joint.  Range of motion: Significantly reduced abduction of the left shoulder due to pain with any movement above 90 degrees.  Internal and external rotation intact and symmetrical bilaterally.  Rotator cuff testing:  Full strength however does endorse pain with empty can (supraspinatus).  External and internal rotation with full strength and no pain.  Biceps testing: Endorses pain with speeds/O'Brien  Impingement testing: Significant pain with Hawkins  Strength testing:  5 out of 5 strength with shoulder abduction (C5), wrist extension (C6), wrist flexion (C7), grip strength (C8), and finger abduction (T1).  Sensation: Intact to light touch throughout bilateral upper extremities.   Brisk distal capillary refill.  Back and hip exam: Normal Gait.  Normal Spinal curvature, without excessive lumbar lordosis, thoracic kyphosis, or scoliosis.   ROM: No Pain  with forward flexion, able to achieve Toe-Touch.  Does endorse some discomfort with extension, though range of motion is preserved.  Symmetric internal and external rotation of the hips.  Palpation: Endorses tenderness to palpation over the bilateral quadratus lumborum musculature, as well as the external obliques.  No significant paraspinal lumbar tenderness.  Also with tenderness with in the left gluteal musculature, specifically noted over the area of the medius.  Tenderness over bilateral greater trochanters.  No spinal  midline tenderness, no deformity or step-offs. No paraspinal muscle tenderness, and no tenderness over the SI Joints bilaterally.   Strength: Hip flexion (L1), Hip Aduction (L2), Knee Extension (L3) are 5/5 Bilaterally Foot Inversion (L4), Dorsiflexion (L5), and Eversion (S1) 5/5 Bilaterally Sensation: Sensation intact throughout bilateral lower extremities.  Special Tests:  FABER: No SI Joint Pain 50 years does cause some lateral hip pain. SLR: No radiation down Ipilateral or contralateral leg bilaterally  Imaging: Limited extremity ultrasound-left shoulder:  Biceps visualized with minimal surrounding fluid.  Fibers appear intact. Does have significant thickening and swelling appreciated within the subacromial bursa.  Supraspinatus intact, with no cortical irregularities.   Impression: consistent with subacromial bursitis.     Assessment & Plan: 40 year old female presenting to clinic to follow-up on chronic left gluteal pain, lower back pain, as well as with new left shoulder pain. Left gluteal tendinitis:  Overall, it seems as though patient's function has started to improve with better adherence to exercise prescription.  Counseled to continue with her gluteal rehabilitation exercises, in the hopes that she will continue to see further improvement.  Lower back pain:  Pain seems to localize to her quadratus lumborum and external obliques bilaterally.  No red flag spinal symptoms.  We discussed home exercises that she can perform for her oblique musculature.  Optimistic that this may help to improve with better gluteal strength and pelvic stabilization.  Left shoulder pain: Left shoulder impingement, and ultrasound suggestive of subacromial bursitis.  Due to the significance of her symptoms today, injection therapy was discussed, and patient opted to proceed. -Corticosteroid injection performed today, with immediate improvement in range of motion. -Aftercare and return precautions  were discussed.  Patient was happy with today's plan.  She had no further questions or concerns today.     Procedures: Left Subacromial Bursal Cortisone Injection:  Risks and benefits of procedure discussed, Patient opted to proceed. Written Consent obtained.  Timeout performed.  Skin prepped in a sterile fashion with betadine before further cleansing with alcohol. Ethyl Chloride was used for topical analgesia.  Right Subacromial Bursa was injected with 3cc 1% Lidocaine without epinephrine combined with 40mg  methylprednisolone. Injection was performed under ultrasound guidance, and injectate was clearly seen filling the bursal space.    Patient tolerated the injection well with no immediate complications. Aftercare instructions were discussed, and patient was given strict return precautions.   I observed and examined the patient with theSM resident and agree with assessment and plan.  Note reviewed and modified by me. Ila Mcgill, MD

## 2020-07-09 ENCOUNTER — Other Ambulatory Visit: Payer: Self-pay

## 2020-07-09 MED ORDER — TRAMADOL HCL 50 MG PO TABS
ORAL_TABLET | ORAL | 0 refills | Status: DC
  Filled 2020-07-09: qty 15, 8d supply, fill #0

## 2020-07-17 IMAGING — MR MRI LUMBAR SPINE WITHOUT CONTRAST
5 series · 31 of 48 positions shown · non-contrast
Comparison: Prior radiograph from 02/28/2017

CLINICAL DATA: Initial evaluation for chronic low back pain with
numbness and tingling in the left lower extremity.

EXAM:
MRI LUMBAR SPINE WITHOUT CONTRAST
TECHNIQUE: Multiplanar, multisequence MR imaging of the lumbar spine was
performed. No intravenous contrast was administered.

[Series 5: T2 · sagittal · 4.0mm · 0.81mm/px · 6 of 17 slices shown (1 of 2)]
[im 1/17]
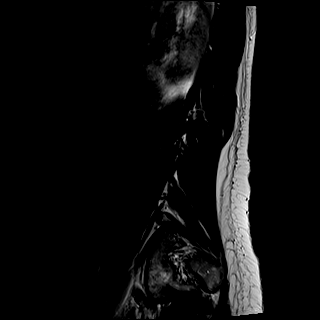
[im 4/17]
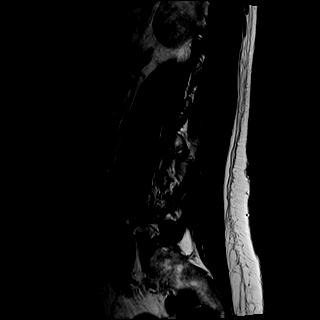
[im 7/17]
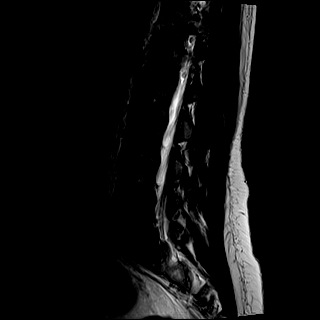
[im 10/17]
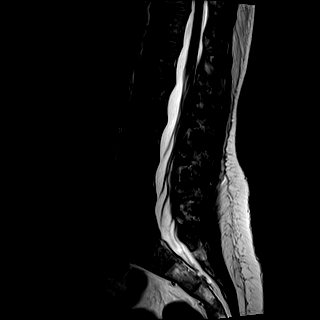
[im 13/17]
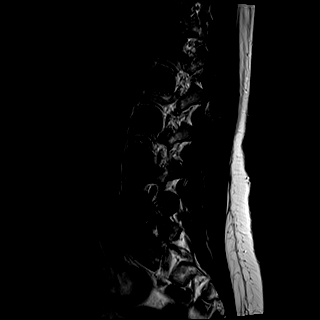
[im 17/17]
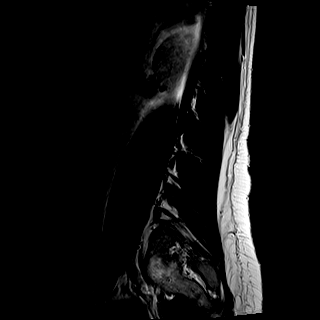

[Series 6: T1 · sagittal · 4.0mm · 0.81mm/px · 7 of 17 slices shown (1 of 2)]
[im 1/17]
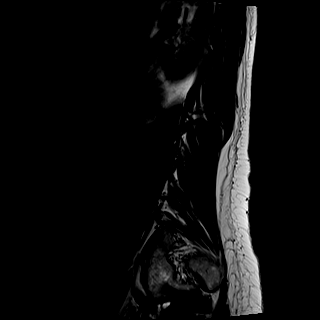
[im 3/17]
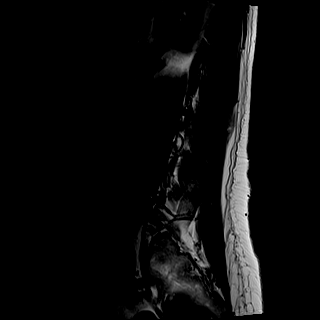
[im 6/17]
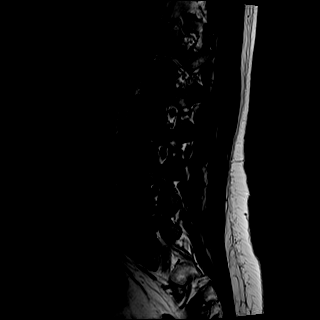
[im 9/17]
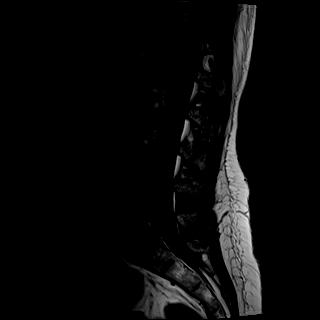
[im 11/17]
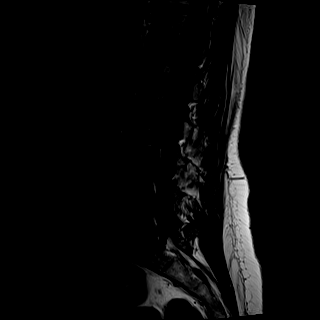
[im 14/17]
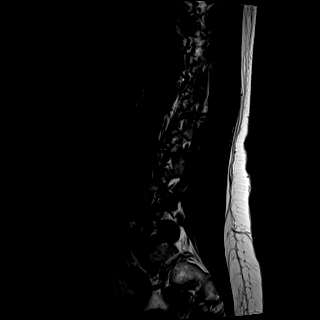
[im 17/17]
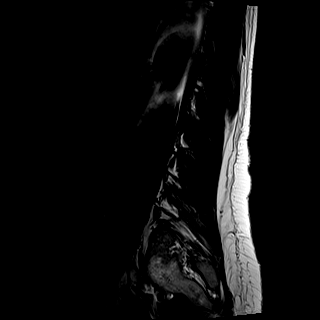

[Series 7: STIR · sagittal · 4.0mm · 0.41mm/px · 2 of 17 slices shown]
[im 1/17]
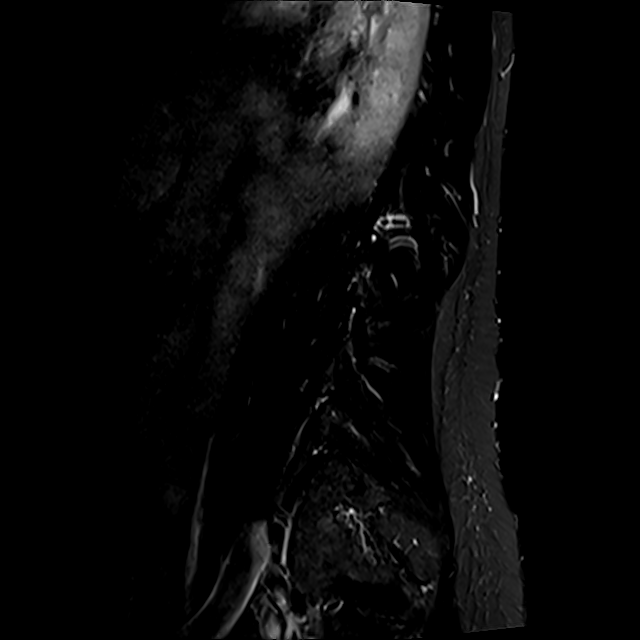
[im 3/17]
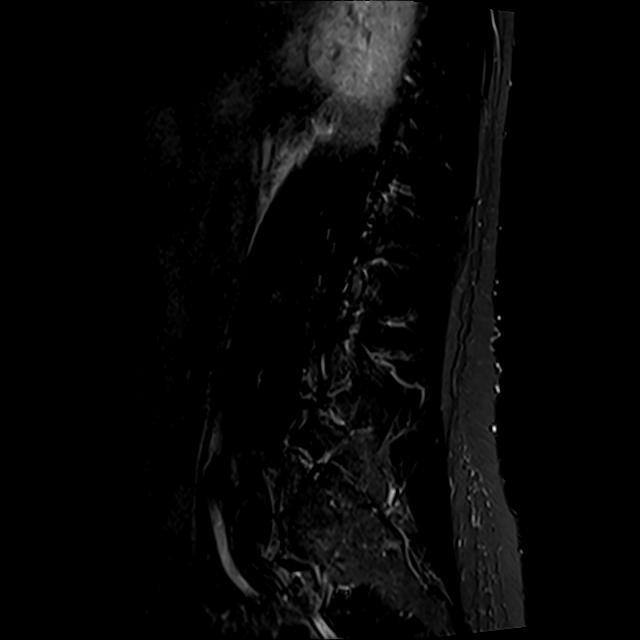

[Series 8: T2 · axial · 4.0mm · 0.78mm/px · z∈[-77,+120]mm · 8 of 36 slices shown (2 of 2)]
[im 1/36]
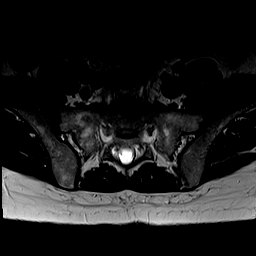
[im 6/36]
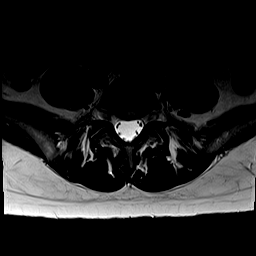
[im 11/36]
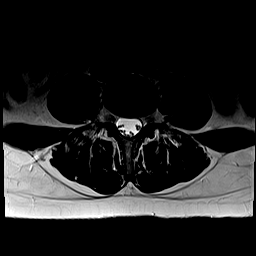
[im 17/36]
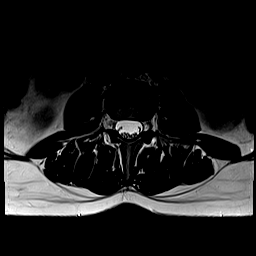
[im 19/36]
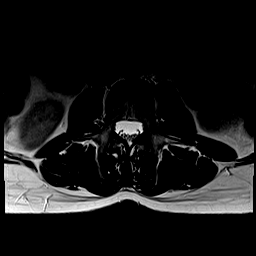
[im 25/36]
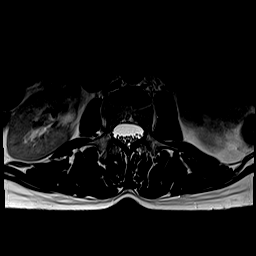
[im 30/36]
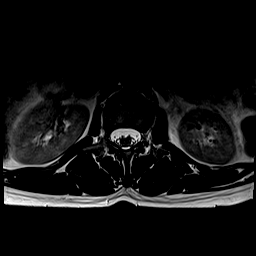
[im 36/36]
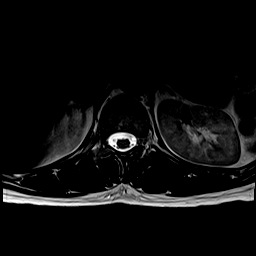

[Series 9: T1 · axial · 4.0mm · 0.39mm/px · z∈[-77,+120]mm · 8 of 36 slices shown (2 of 2)]
[im 1/36]
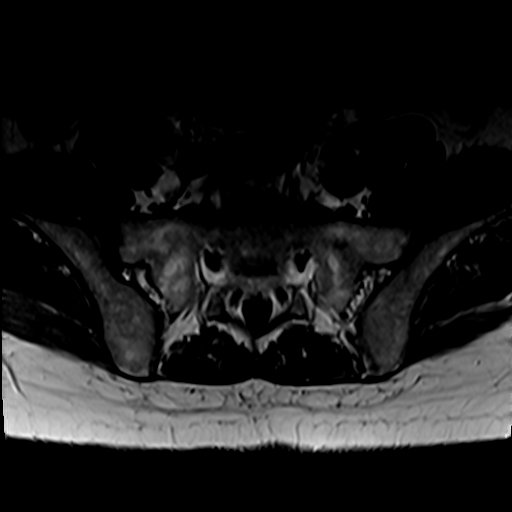
[im 6/36]
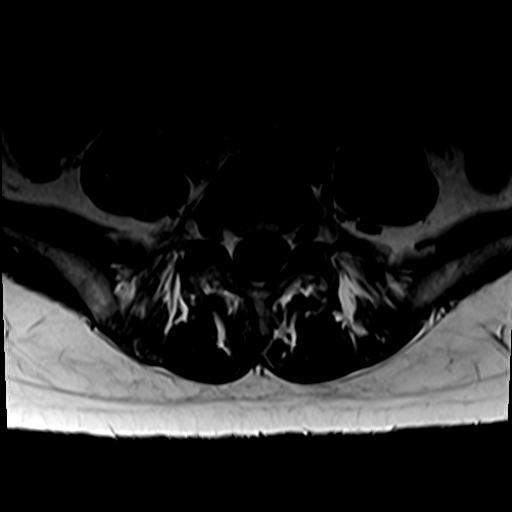
[im 11/36]
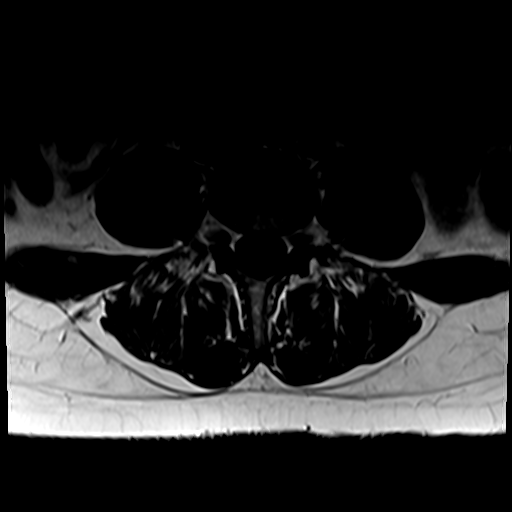
[im 17/36]
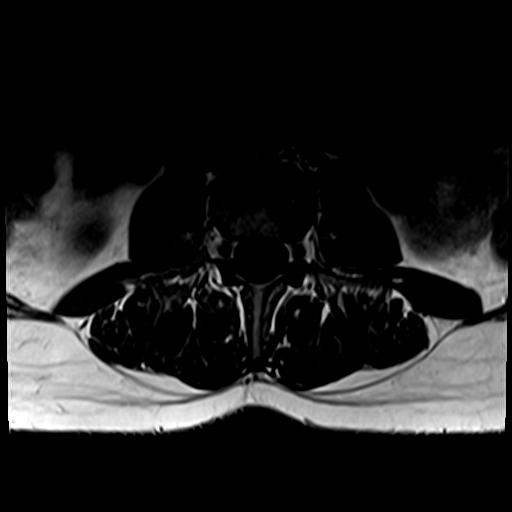
[im 19/36]
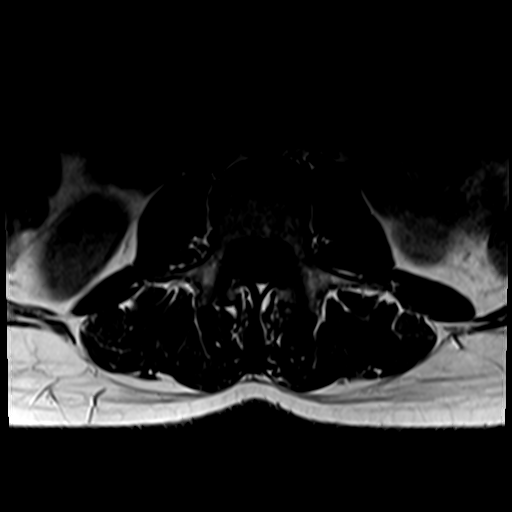
[im 25/36]
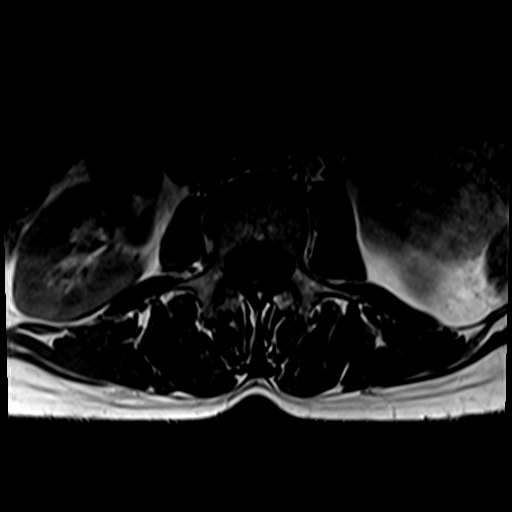
[im 30/36]
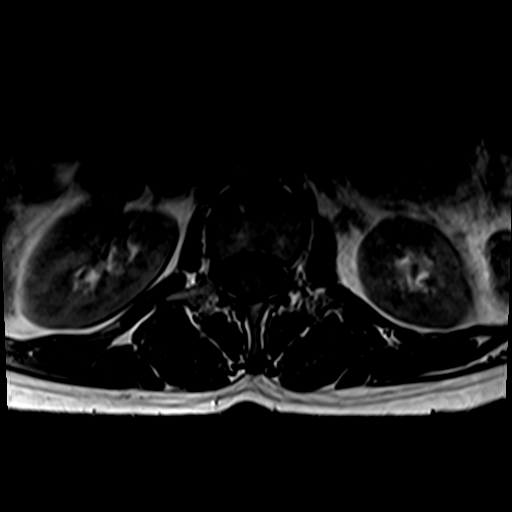
[im 36/36]
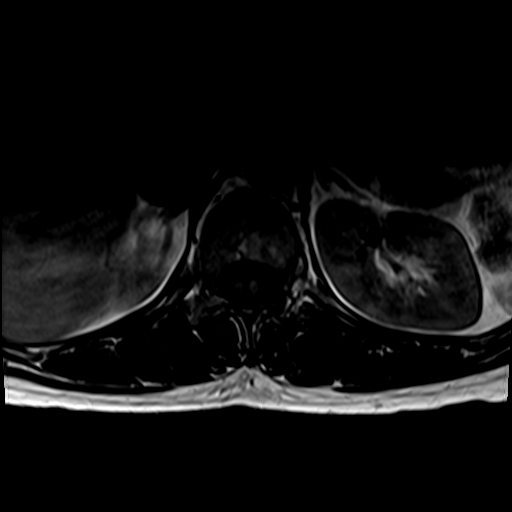

[31 of 48 positions shown; findings below may reference images not displayed]

FINDINGS: Segmentation: Standard. Lowest well-formed disc labeled the L5-S1
level.

Alignment: Minimal left convex scoliosis. Alignment otherwise normal
with preservation of the normal lumbar lordosis. No listhesis.

Vertebrae: Vertebral body height maintained without evidence for
acute or chronic fracture. Bone marrow signal intensity within
normal limits. No discrete or worrisome osseous lesions. No abnormal
marrow edema.

Conus medullaris and cauda equina: Conus extends to the L1 level.
Conus and cauda equina appear normal.

Paraspinal and other soft tissues: Paraspinous soft tissues within
normal limits. Visualized visceral structures are unremarkable.

Disc levels:

No significant disc pathology seen within the lumbar spine. No disc
bulge or focal disc herniation. No significant facet degeneration.
No canal or neural foraminal stenosis. No impingement.
IMPRESSION: Normal MRI of the lumbar spine. No significant disc pathology,
stenosis, or neural impingement. No findings to explain patient's
symptoms identified.

## 2020-07-17 IMAGING — MR MRI CERVICAL SPINE WITHOUT CONTRAST
5 series · 39 of 48 positions shown · non-contrast
Comparison: None available.

CLINICAL DATA: Initial evaluation for neck pain with extension into
the left upper extremity with associated numbness for several years.

EXAM:
MRI CERVICAL SPINE WITHOUT CONTRAST
TECHNIQUE: Multiplanar, multisequence MR imaging of the cervical spine was
performed. No intravenous contrast was administered.

[Series 5: T2 · sagittal · 3.0mm · 0.62mm/px · 6 of 15 slices shown (1 of 2)]
[im 1/15]
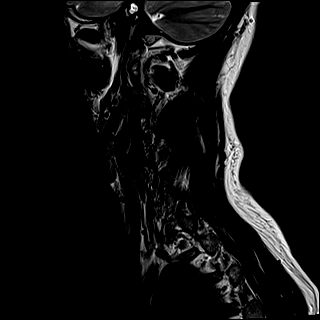
[im 3/15]
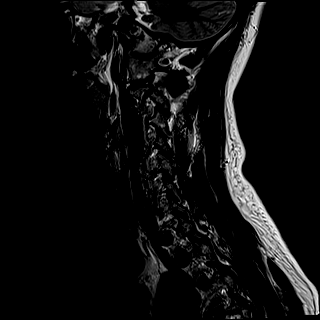
[im 6/15]
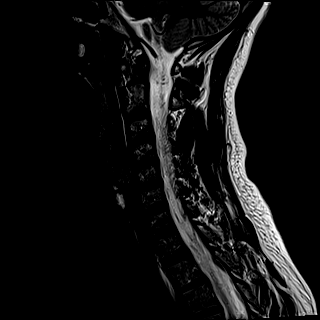
[im 9/15]
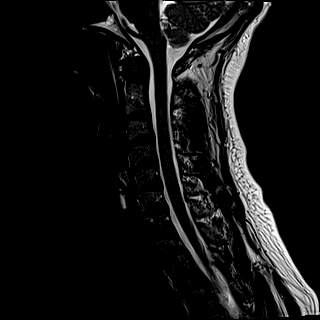
[im 12/15]
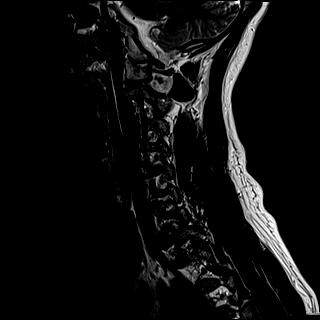
[im 15/15]
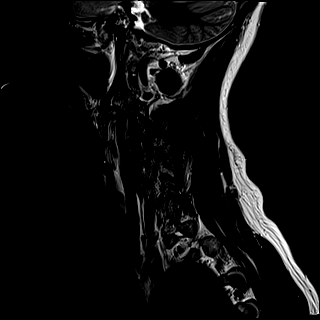

[Series 6: FLAIR · sagittal · 3.0mm · 0.78mm/px · 7 of 15 slices shown]
[im 1/15]
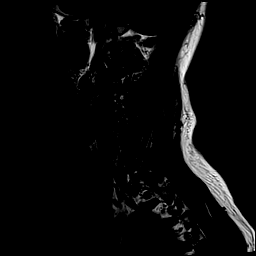
[im 3/15]
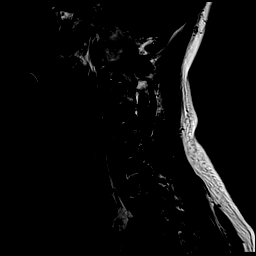
[im 5/15]
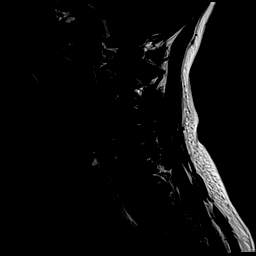
[im 8/15]
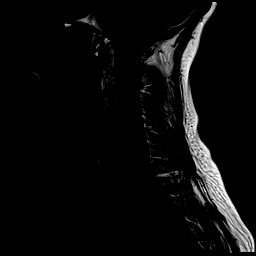
[im 10/15]
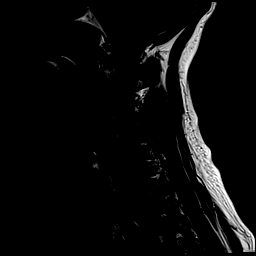
[im 12/15]
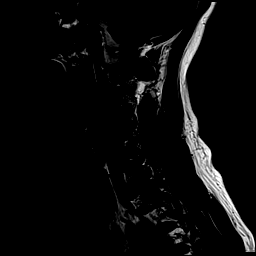
[im 15/15]
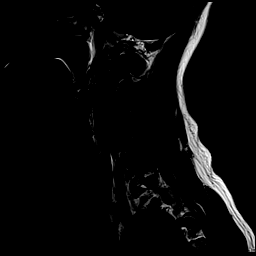

[Series 7: STIR · sagittal · 3.0mm · 0.62mm/px · 7 of 15 slices shown]
[im 1/15]
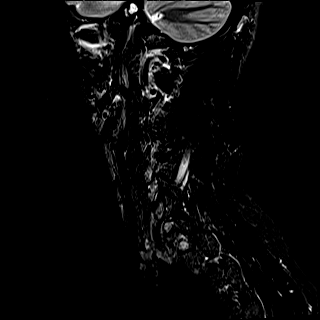
[im 3/15]
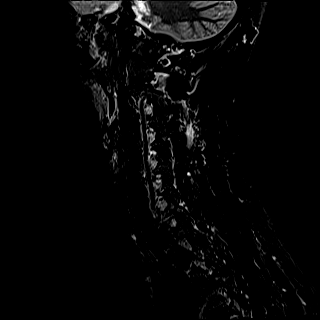
[im 5/15]
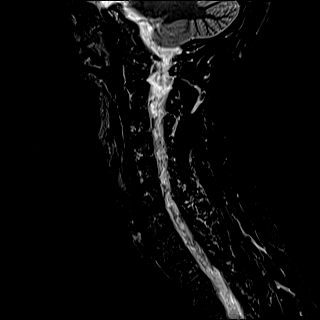
[im 8/15]
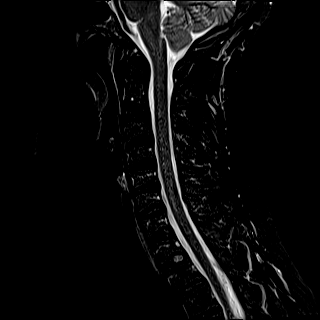
[im 10/15]
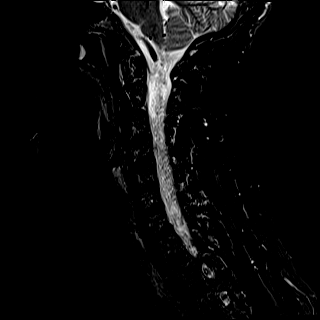
[im 12/15]
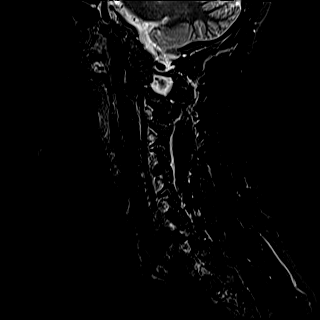
[im 15/15]
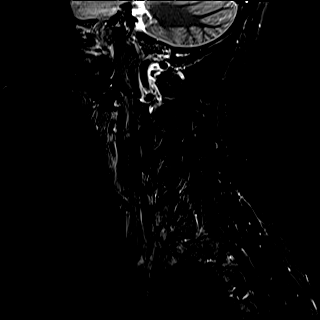

[Series 8: T2 · axial · 3.0mm · 0.70mm/px · z∈[-52,+45]mm · 11 of 29 slices shown (2 of 2)]
[im 1/29]
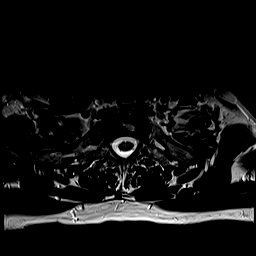
[im 3/29]
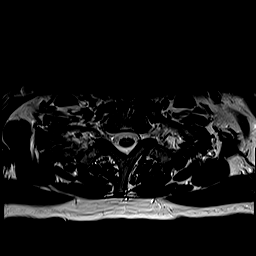
[im 5/29]
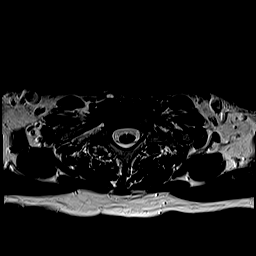
[im 7/29]
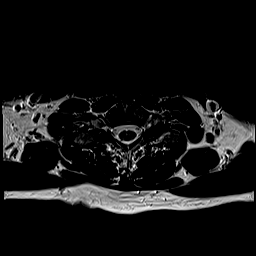
[im 9/29]
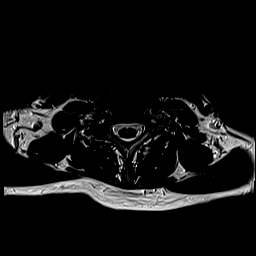
[im 11/29]
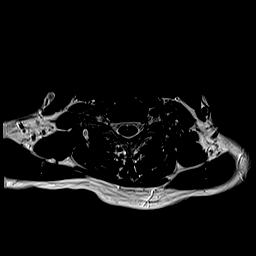
[im 13/29]
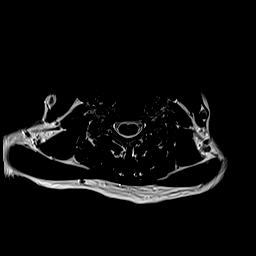
[im 16/29]
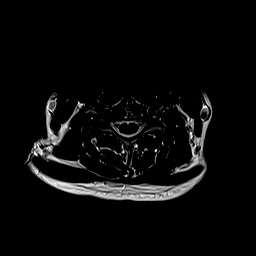
[im 20/29]
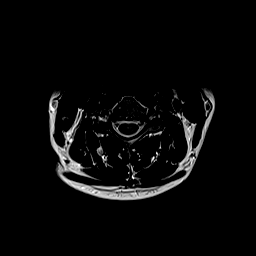
[im 24/29]
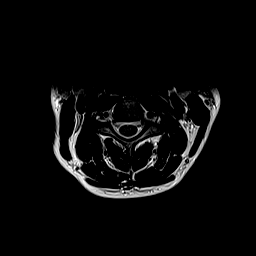
[im 29/29]
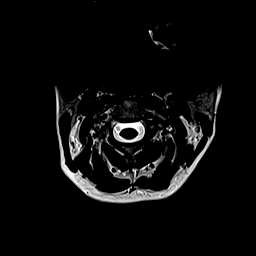

[Series 9: ax mpgr · axial · 3.0mm · 0.35mm/px · z∈[-52,+45]mm · 8 of 29 slices shown]
[im 1/29]
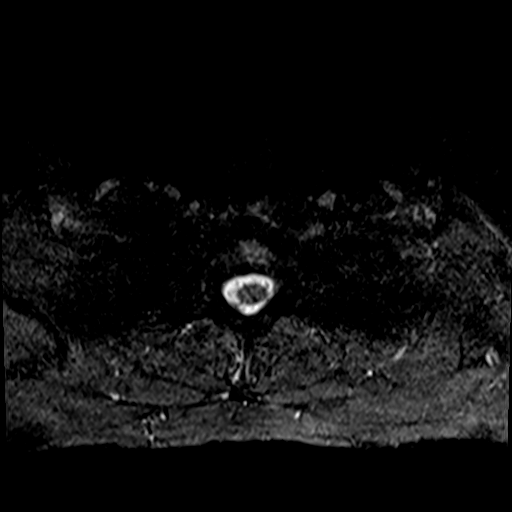
[im 5/29]
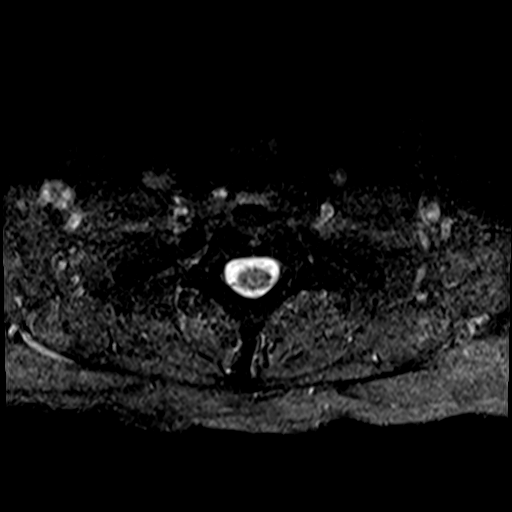
[im 9/29]
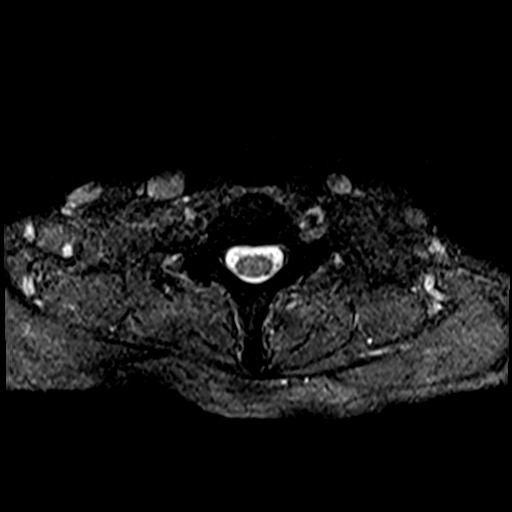
[im 13/29]
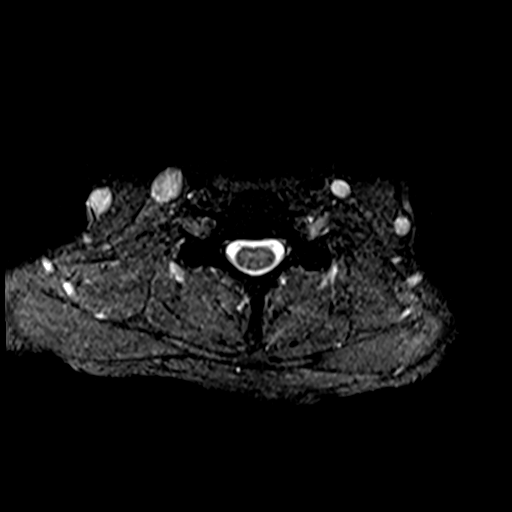
[im 16/29]
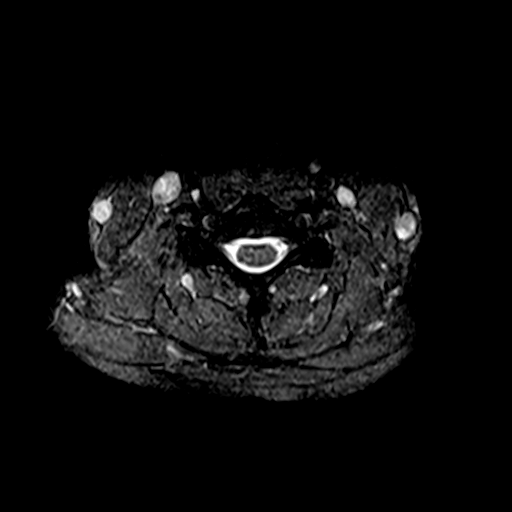
[im 20/29]
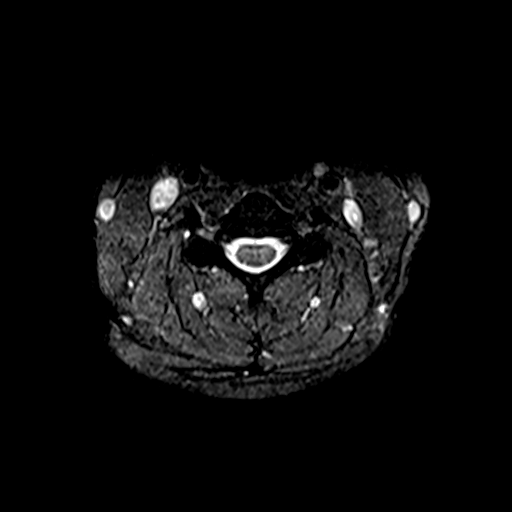
[im 24/29]
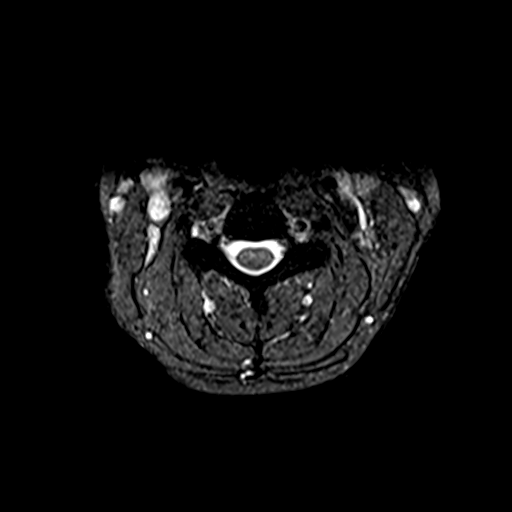
[im 29/29]
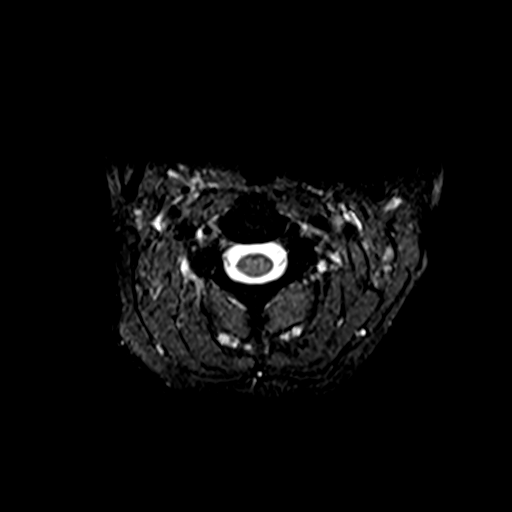

[39 of 48 positions shown; findings below may reference images not displayed]

FINDINGS: Alignment: Straightening of the normal cervical lordosis. No
listhesis.

Vertebrae: Vertebral body height maintained without evidence for
acute or chronic fracture. Bone marrow signal intensity within
normal limits. No discrete or worrisome osseous lesions. No abnormal
marrow edema.

Cord: Signal intensity within the cervical spinal cord is normal.

Posterior Fossa, vertebral arteries, paraspinal tissues: Mild Chiari
1 malformation with the cerebellar tonsils protruding up to 7 mm
through the foramen magnum. Visualized brain and posterior fossa
otherwise unremarkable. Paraspinous and prevertebral soft tissues
within normal limits. Normal intravascular flow voids seen within
the vertebral arteries bilaterally.

Disc levels:

No significant disc pathology seen within the cervical spine. No
significant disc bulge or focal disc protrusion. No significant
facet pathology. No canal or neural foraminal stenosis.
IMPRESSION: 1. Mild Chiari 1 malformation with the cerebellar tonsils extending
up to 7 mm through the foramen magnum.
2. Otherwise unremarkable MRI of the cervical spine. No significant
disc pathology or stenosis.

## 2020-08-18 ENCOUNTER — Other Ambulatory Visit: Payer: Self-pay | Admitting: Family Medicine

## 2020-08-18 ENCOUNTER — Other Ambulatory Visit: Payer: Self-pay

## 2020-08-18 MED ORDER — TRAMADOL HCL 50 MG PO TABS
ORAL_TABLET | ORAL | 0 refills | Status: DC
  Filled 2020-08-18: qty 15, 7d supply, fill #0

## 2020-08-18 MED FILL — Buspirone HCl Tab 7.5 MG: ORAL | 30 days supply | Qty: 60 | Fill #0 | Status: AC

## 2020-08-18 NOTE — Telephone Encounter (Signed)
Message sent via my chart

## 2020-08-18 NOTE — Telephone Encounter (Signed)
Refill provided.  The patient needs a follow-up visit for further refills.  Please contact her to get this scheduled.  Thanks.

## 2020-08-18 NOTE — Telephone Encounter (Signed)
RX Refill:Tramadol Last Seen:03-30-20 Last ordered:07-09-20

## 2020-09-06 DIAGNOSIS — D3132 Benign neoplasm of left choroid: Secondary | ICD-10-CM | POA: Diagnosis not present

## 2020-09-06 DIAGNOSIS — H16223 Keratoconjunctivitis sicca, not specified as Sjogren's, bilateral: Secondary | ICD-10-CM | POA: Diagnosis not present

## 2020-09-06 DIAGNOSIS — H52523 Paresis of accommodation, bilateral: Secondary | ICD-10-CM | POA: Diagnosis not present

## 2020-09-06 DIAGNOSIS — H52223 Regular astigmatism, bilateral: Secondary | ICD-10-CM | POA: Diagnosis not present

## 2020-09-14 ENCOUNTER — Ambulatory Visit: Payer: Self-pay

## 2020-09-14 DIAGNOSIS — M7552 Bursitis of left shoulder: Secondary | ICD-10-CM

## 2020-09-14 NOTE — Addendum Note (Signed)
Addended by: Cyd Silence on: 09/14/2020 10:17 AM   Modules accepted: Orders

## 2020-09-22 ENCOUNTER — Other Ambulatory Visit (INDEPENDENT_AMBULATORY_CARE_PROVIDER_SITE_OTHER): Payer: Self-pay | Admitting: Internal Medicine

## 2020-09-22 ENCOUNTER — Encounter: Payer: 59 | Admitting: Family Medicine

## 2020-09-22 ENCOUNTER — Telehealth: Payer: Self-pay | Admitting: Family Medicine

## 2020-09-22 NOTE — Telephone Encounter (Signed)
Noted. I certainly apologize for having to be out of the office today. I have removed myself as her PCP. I can not see where we have cancelled an appointment with her recently. Please look to see if we had to cancel or reschedule her physical previously.

## 2020-09-22 NOTE — Telephone Encounter (Signed)
Called to reschedule Patient's appointment with Dr Caryl Bis for 09/22/20 at 1:15 pm for a physical. Patient was upset and disappointed. States that this is the 4th time the same appointment has to be rescheduled.   Patient states that she was trying to be understanding but can not take be re-scheduled 4 times in a row. States she will need to see a doctor that can be available and accommodate her.   Patient states she would like to end her Patient relationship with Dr Caryl Bis and our office [facility]. States she will like to find a provider at another office and to cancel her appointment without a re-schedule.

## 2020-09-22 NOTE — Telephone Encounter (Signed)
Looking at the patient's appointment history this is not the 4th time this particular appointment has been rescheduled. This is the first time we have rescheduled this appointment.

## 2020-09-23 ENCOUNTER — Telehealth (INDEPENDENT_AMBULATORY_CARE_PROVIDER_SITE_OTHER): Payer: Self-pay

## 2020-09-23 NOTE — Telephone Encounter (Signed)
I agree, we cannot accept her.

## 2020-10-08 ENCOUNTER — Other Ambulatory Visit: Payer: Self-pay

## 2020-10-08 ENCOUNTER — Other Ambulatory Visit: Payer: Self-pay | Admitting: Family Medicine

## 2020-10-08 DIAGNOSIS — F32A Depression, unspecified: Secondary | ICD-10-CM

## 2020-10-12 ENCOUNTER — Ambulatory Visit: Payer: 59 | Admitting: Internal Medicine

## 2020-10-29 ENCOUNTER — Ambulatory Visit: Payer: 59 | Admitting: Nurse Practitioner

## 2020-10-29 ENCOUNTER — Other Ambulatory Visit: Payer: Self-pay

## 2020-10-29 ENCOUNTER — Encounter: Payer: Self-pay | Admitting: Nurse Practitioner

## 2020-10-29 VITALS — BP 110/73 | HR 75 | Temp 97.6°F | Ht 65.0 in | Wt 192.0 lb

## 2020-10-29 DIAGNOSIS — G8929 Other chronic pain: Secondary | ICD-10-CM

## 2020-10-29 DIAGNOSIS — Z139 Encounter for screening, unspecified: Secondary | ICD-10-CM | POA: Diagnosis not present

## 2020-10-29 DIAGNOSIS — E663 Overweight: Secondary | ICD-10-CM | POA: Insufficient documentation

## 2020-10-29 DIAGNOSIS — Z3009 Encounter for other general counseling and advice on contraception: Secondary | ICD-10-CM | POA: Diagnosis not present

## 2020-10-29 DIAGNOSIS — M25552 Pain in left hip: Secondary | ICD-10-CM

## 2020-10-29 DIAGNOSIS — G43009 Migraine without aura, not intractable, without status migrainosus: Secondary | ICD-10-CM | POA: Diagnosis not present

## 2020-10-29 DIAGNOSIS — Z7689 Persons encountering health services in other specified circumstances: Secondary | ICD-10-CM | POA: Diagnosis not present

## 2020-10-29 DIAGNOSIS — E611 Iron deficiency: Secondary | ICD-10-CM | POA: Diagnosis not present

## 2020-10-29 DIAGNOSIS — G43909 Migraine, unspecified, not intractable, without status migrainosus: Secondary | ICD-10-CM | POA: Insufficient documentation

## 2020-10-29 DIAGNOSIS — R635 Abnormal weight gain: Secondary | ICD-10-CM

## 2020-10-29 DIAGNOSIS — F419 Anxiety disorder, unspecified: Secondary | ICD-10-CM | POA: Diagnosis not present

## 2020-10-29 DIAGNOSIS — E669 Obesity, unspecified: Secondary | ICD-10-CM | POA: Insufficient documentation

## 2020-10-29 DIAGNOSIS — F32A Depression, unspecified: Secondary | ICD-10-CM

## 2020-10-29 MED ORDER — MEDROXYPROGESTERONE ACETATE 150 MG/ML IM SUSY
PREFILLED_SYRINGE | INTRAMUSCULAR | 3 refills | Status: DC
  Filled 2020-10-29: qty 1, 90d supply, fill #0
  Filled 2021-02-03: qty 1, 90d supply, fill #1
  Filled 2021-03-24: qty 1, 84d supply, fill #2
  Filled 2021-05-30: qty 1, 90d supply, fill #0
  Filled 2021-08-26: qty 1, 90d supply, fill #1

## 2020-10-29 MED ORDER — RIZATRIPTAN BENZOATE 5 MG PO TBDP
5.0000 mg | ORAL_TABLET | ORAL | 0 refills | Status: DC | PRN
Start: 2020-10-29 — End: 2021-10-18
  Filled 2020-10-29: qty 10, 17d supply, fill #0

## 2020-10-29 MED ORDER — PHENTERMINE HCL 15 MG PO CAPS
15.0000 mg | ORAL_CAPSULE | ORAL | 0 refills | Status: DC
  Filled 2020-10-29: qty 30, 30d supply, fill #0

## 2020-10-29 MED ORDER — NURTEC 75 MG PO TBDP: 75.0000 mg | ORAL_TABLET | ORAL | 3 refills | Status: DC

## 2020-10-29 NOTE — Progress Notes (Signed)
New Patient Office Visit  Subjective:  Patient ID: Sarah Gallagher, female    DOB: 1980-10-04  Age: 40 y.o. MRN: [id number]  CC:  Chief Complaint  Patient presents with   New Patient (Initial Visit)    Here to establish care. Complains of weight gain today, wants to discuss Ozempic. Also wants to discuss a different med for migraines.     HPI Sarah Gallagher presents for new patient visit. Transferring care from Dr. Caryl Bis in [place]. Last physical was 05/21/20. Last labs were drawn over a year ago.  5 years ago she started having left hip pain. She has seen sports medicine, podiatry, neurology, PT, and orthopedics. Sometimes she has pain that radiates down her left leg. She has tried gabapentin and topamax.  She works at [employer] in [place], but usually ends up in ICU.  She has hx of Chiari malformation, panic attacks, postpartum anxiety. Took klonopin in the past, but hasn't need that recently  She has hx of migraine and would like to change her medicines. She states that she has 10+ migraine days per month.  She has been using excedrin migraine and imitrex. She feels horrible with imitrex.    Past Medical History:  Diagnosis Date   Arthritis    Chest discomfort    a. associated with palpitations.   Depression    Dyspnea on exertion    a. 12/2016 Echo: EF 55-60%, no rwma, nl PASP.   Juvenile rheumatoid arthritis (HCC)    Meniere disease    Migraines    Neuropathy    Palpitations    PVC's (premature ventricular contractions)    a. 01/2017 48h Holter: Rare PAC/PVC. Freq runs of sinus tachycardia.    Past Surgical History:  Procedure Laterality Date   KNEE SURGERY Right 1997, 1997   torn meniscus, ACL replacement     REFRACTIVE SURGERY     lasix    Family History  Problem Relation Age of Onset   Diabetes Mother    Arthritis Father    Other Father        atrial flutter   Emphysema Father        smoker   Diabetes Maternal Aunt    Diabetes  Maternal Uncle    Stroke Paternal Aunt    Arthritis Maternal Grandmother    Diabetes Maternal Grandmother    Cancer Maternal Grandmother        skin   Arthritis Paternal Grandmother    Cancer Paternal Grandmother        skin    Social History   Socioeconomic History   Marital status: Married    Spouse name: Not on file   Number of children: 3   Years of education: BS   Highest education level: Not on file  Occupational History    Comment: [employer], Resp Therapist  Tobacco Use   Smoking status: Never   Smokeless tobacco: Never  Vaping Use   Vaping Use: Never used  Substance and Sexual Activity   Alcohol use: Not Currently    Alcohol/week: 0.0 standard drinks    Comment: rare   Drug use: No   Sexual activity: Yes    Birth control/protection: Injection  Other Topics Concern   Not on file  Social History Narrative   Lives in [place] with husband and children.  Not currently exercising.  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   Caffeine 2-3 cups daily   Social Determinants of Health   Financial  Resource Strain: Not on file  Food Insecurity: Not on file  Transportation Needs: Not on file  Physical Activity: Not on file  Stress: Not on file  Social Connections: Not on file  Intimate Partner Violence: Not on file    ROS Review of Systems  Objective:   Today's Vitals: BP 110/73 (BP Location: Left Arm, Patient Position: Sitting, Cuff Size: Large)   Pulse 75   Temp 97.6 F (36.4 C) (Temporal)   Ht '5\' 5"'  (1.651 m)   Wt 192 lb (87.1 kg)   SpO2 97%   BMI 31.95 kg/m   Physical Exam  Assessment & Plan:   Problem List Items Addressed This Visit       Cardiovascular and Mediastinum   Migraine    -has taken imitrex and excedrin, but imitrex makes her feel bad -Rx. maxalt for abortive therapy -has 10+ migraine days per month -Rx. Nurtec QOD for prophylaxis       Relevant Medications   Rimegepant Sulfate (NURTEC) 75 MG TBDP   rizatriptan (MAXALT-MLT) 5 MG  disintegrating tablet   medroxyPROGESTERone Acetate 150 MG/ML SUSY     Other   Anxiety and depression    -takes klonopin for anxiety/panic attacks, but rarely uses the medication -may fill in the future if needed -Continue buspar       Chronic hip pain, left    -Has chronic pain for several years; seeing sports medicine now, but has seen PT, neurology, podiatry, and ortho in the past, and no obvious etiology for the pain -refilled tramadol       Iron deficiency    -will check iron panel with next set of labs       Relevant Orders   Iron, TIBC and Ferritin Panel   Encounter to establish care - Primary   Relevant Orders   CBC with Differential/Platelet   CMP14+EGFR   Lipid Panel With LDL/HDL Ratio   TSH   Weight gain    -states she gained 40 pounds in last 2 years -may be related to depo injections -will check TSH with labs -Rx. Phentermine (low-dose); has hx of palpitations; stop phentermine if palpitations develop       Relevant Medications   phentermine 15 MG capsule   Other Visit Diagnoses     Screening due       Relevant Orders   Hepatitis C Antibody   Birth control counseling       Relevant Medications   medroxyPROGESTERone Acetate 150 MG/ML SUSY       Outpatient Encounter Medications as of 10/29/2020  Medication Sig   aspirin-acetaminophen-caffeine (EXCEDRIN MIGRAINE) 250-250-65 MG tablet Take by mouth daily.   busPIRone (BUSPAR) 7.5 MG tablet TAKE 1 TABLET (7.5 MG TOTAL) BY MOUTH 2 (TWO) TIMES DAILY.   phentermine 15 MG capsule Take 1 capsule (15 mg total) by mouth every morning.   Rimegepant Sulfate (NURTEC) 75 MG TBDP Take 75 mg by mouth every other day.   rizatriptan (MAXALT-MLT) 5 MG disintegrating tablet Take 1 tablet (5 mg total) by mouth as needed for migraine. May repeat in 2 hours if needed   traMADol (ULTRAM) 50 MG tablet TAKE 1 TABLET BY MOUTH EVERY 12 HOURS AS NEEDED FOR SEVERE PAIN.   [DISCONTINUED] medroxyPROGESTERone Acetate 150 MG/ML  SUSY INJECT 1 ML INTO THE MUSCLE EVERY 3 MONTHS.   [DISCONTINUED] SUMAtriptan (IMITREX) 50 MG tablet TAKE 1 TABLET BY MOUTH ONCE AS NEEDED FOR MIGRAINE FOR UP TO 1 DOSE   medroxyPROGESTERone Acetate 150 MG/ML  SUSY INJECT 1 ML INTO THE MUSCLE EVERY 3 MONTHS.   [DISCONTINUED] baclofen (LIORESAL) 10 MG tablet TAKE 1 TABLET BY MOUTH 3 TIMES DAILY AS NEEDED FOR MUSCLE SPASMS. (Patient not taking: Reported on 10/29/2020)   [DISCONTINUED] clonazePAM (KLONOPIN) 0.5 MG tablet TAKE 1/2 TABLET (0.25 MG TOTAL) BY MOUTH 2 (TWO) TIMES DAILY AS NEEDED FOR ANXIETY. (Patient not taking: Reported on 10/29/2020)   [DISCONTINUED] cyclobenzaprine (FLEXERIL) 10 MG tablet TAKE 1 TABLET BY MOUTH AT BEDTIME AS NEEDED FOR MUSCLE SPASMS. (Patient not taking: Reported on 10/29/2020)   [DISCONTINUED] ibuprofen (ADVIL) 800 MG tablet Take 1 tablet (800 mg total) by mouth 3 (three) times daily. (Patient not taking: Reported on 10/29/2020)   No facility-administered encounter medications on file as of 10/29/2020.    Follow-up: Return in about 1 month (around 11/29/2020) for Lab follow-up (IDA, weight gain, migraine).   Noreene Larsson, NP

## 2020-10-29 NOTE — Assessment & Plan Note (Signed)
-  Has chronic pain for several years; seeing sports medicine now, but has seen PT, neurology, podiatry, and ortho in the past, and no obvious etiology for the pain -refilled tramadol

## 2020-10-29 NOTE — Assessment & Plan Note (Addendum)
-  states she gained 40 pounds in last 2 years -may be related to depo injections -will check TSH with labs -Rx. Phentermine (low-dose); has hx of palpitations; stop phentermine if palpitations develop

## 2020-10-29 NOTE — Assessment & Plan Note (Signed)
-  will check iron panel with next set of labs

## 2020-10-29 NOTE — Patient Instructions (Signed)
Please have fasting labs drawn 2-3 days prior to your appointment so we can discuss the results during your office visit.  

## 2020-10-29 NOTE — Assessment & Plan Note (Addendum)
-  takes klonopin for anxiety/panic attacks, but rarely uses the medication -may fill in the future if needed -Continue buspar

## 2020-10-29 NOTE — Assessment & Plan Note (Signed)
-  has taken imitrex and excedrin, but imitrex makes her feel bad -Rx. maxalt for abortive therapy -has 10+ migraine days per month -Rx. Nurtec QOD for prophylaxis

## 2020-11-03 ENCOUNTER — Other Ambulatory Visit: Payer: Self-pay

## 2020-11-22 DIAGNOSIS — Z139 Encounter for screening, unspecified: Secondary | ICD-10-CM | POA: Diagnosis not present

## 2020-11-22 DIAGNOSIS — Z7689 Persons encountering health services in other specified circumstances: Secondary | ICD-10-CM | POA: Diagnosis not present

## 2020-11-22 DIAGNOSIS — M25552 Pain in left hip: Secondary | ICD-10-CM | POA: Diagnosis not present

## 2020-11-23 LAB — CBC WITH DIFFERENTIAL/PLATELET
Basophils Absolute: 0.1 10*3/uL (ref 0.0–0.2)
Basos: 1 %
EOS (ABSOLUTE): 0.1 10*3/uL (ref 0.0–0.4)
Eos: 1 %
Hematocrit: 41.8 % (ref 34.0–46.6)
Hemoglobin: 13.9 g/dL (ref 11.1–15.9)
Immature Grans (Abs): 0 10*3/uL (ref 0.0–0.1)
Immature Granulocytes: 0 %
Lymphocytes Absolute: 1.9 10*3/uL (ref 0.7–3.1)
Lymphs: 31 %
MCH: 30 pg (ref 26.6–33.0)
MCHC: 33.3 g/dL (ref 31.5–35.7)
MCV: 90 fL (ref 79–97)
Monocytes Absolute: 0.5 10*3/uL (ref 0.1–0.9)
Monocytes: 9 %
Neutrophils Absolute: 3.6 10*3/uL (ref 1.4–7.0)
Neutrophils: 58 %
Platelets: 204 10*3/uL (ref 150–450)
RBC: 4.63 x10E6/uL (ref 3.77–5.28)
RDW: 12.9 % (ref 11.7–15.4)
WBC: 6.2 10*3/uL (ref 3.4–10.8)

## 2020-11-23 LAB — CMP14+EGFR
ALT: 13 IU/L (ref 0–32)
AST: 16 IU/L (ref 0–40)
Albumin/Globulin Ratio: 1.7 (ref 1.2–2.2)
Albumin: 4.4 g/dL (ref 3.8–4.8)
Alkaline Phosphatase: 75 IU/L (ref 44–121)
BUN/Creatinine Ratio: 15 (ref 9–23)
BUN: 14 mg/dL (ref 6–24)
Bilirubin Total: 0.4 mg/dL (ref 0.0–1.2)
CO2: 20 mmol/L (ref 20–29)
Calcium: 9.4 mg/dL (ref 8.7–10.2)
Chloride: 106 mmol/L (ref 96–106)
Creatinine, Ser: 0.95 mg/dL (ref 0.57–1.00)
Globulin, Total: 2.6 g/dL (ref 1.5–4.5)
Glucose: 90 mg/dL (ref 65–99)
Potassium: 4.3 mmol/L (ref 3.5–5.2)
Sodium: 140 mmol/L (ref 134–144)
Total Protein: 7 g/dL (ref 6.0–8.5)
eGFR: 77 mL/min/{1.73_m2} (ref >59)

## 2020-11-23 LAB — LIPID PANEL WITH LDL/HDL RATIO
Cholesterol, Total: 141 mg/dL (ref 100–199)
HDL: 39 mg/dL — ABNORMAL LOW (ref >39)
LDL Chol Calc (NIH): 91 mg/dL (ref 0–99)
LDL/HDL Ratio: 2.3 ratio (ref 0.0–3.2)
Triglycerides: 52 mg/dL (ref 0–149)
VLDL Cholesterol Cal: 11 mg/dL (ref 5–40)

## 2020-11-23 LAB — IRON,TIBC AND FERRITIN PANEL
Ferritin: 141 ng/mL (ref 15–150)
Iron Saturation: 39 % (ref 15–55)
Iron: 86 ug/dL (ref 27–159)
Total Iron Binding Capacity: 223 ug/dL — ABNORMAL LOW (ref 250–450)
UIBC: 137 ug/dL (ref 131–425)

## 2020-11-23 LAB — TSH: TSH: 1.79 u[IU]/mL (ref 0.450–4.500)

## 2020-11-23 LAB — HEPATITIS C ANTIBODY: Hep C Virus Ab: <0.1 s/co ratio (ref 0.0–0.9)

## 2020-11-23 NOTE — Progress Notes (Signed)
Labs look great.

## 2020-12-02 ENCOUNTER — Other Ambulatory Visit: Payer: Self-pay

## 2020-12-02 ENCOUNTER — Encounter: Payer: Self-pay | Admitting: Nurse Practitioner

## 2020-12-02 ENCOUNTER — Ambulatory Visit: Payer: 59 | Admitting: Nurse Practitioner

## 2020-12-02 VITALS — BP 109/71 | HR 88 | Temp 98.8°F | Ht 65.0 in | Wt 187.0 lb

## 2020-12-02 DIAGNOSIS — R635 Abnormal weight gain: Secondary | ICD-10-CM | POA: Diagnosis not present

## 2020-12-02 DIAGNOSIS — M25552 Pain in left hip: Secondary | ICD-10-CM

## 2020-12-02 DIAGNOSIS — E611 Iron deficiency: Secondary | ICD-10-CM

## 2020-12-02 DIAGNOSIS — G8929 Other chronic pain: Secondary | ICD-10-CM | POA: Diagnosis not present

## 2020-12-02 DIAGNOSIS — G43009 Migraine without aura, not intractable, without status migrainosus: Secondary | ICD-10-CM

## 2020-12-02 MED ORDER — PHENTERMINE HCL 37.5 MG PO CAPS
37.5000 mg | ORAL_CAPSULE | ORAL | 0 refills | Status: DC
  Filled 2020-12-02: qty 30, 30d supply, fill #0

## 2020-12-02 NOTE — Assessment & Plan Note (Signed)
Wt Readings from Last 3 Encounters:  12/02/20 187 lb (84.8 kg)  10/29/20 192 lb (87.1 kg)  07/08/20 190 lb (86.2 kg)   -lost 5 pounds since her last OV; no side effects -INCREASE phentermine to 37.5 mg

## 2020-12-02 NOTE — Assessment & Plan Note (Signed)
-  has taken imitrex and excedrin, but imitrex makes her feel bad -Continue maxalt for abortive therapy -has 10+ migraine days per month -Continue Nurtec QOD for prophylaxis; migraines much improved

## 2020-12-02 NOTE — Assessment & Plan Note (Signed)
-  Has chronic pain for several years; seeing sports medicine now, but has seen PT, neurology, podiatry, and ortho in the past, and no obvious etiology for the pain -Continue tramadol

## 2020-12-02 NOTE — Assessment & Plan Note (Addendum)
-  iron panel was great  -no need for iron supplement

## 2020-12-02 NOTE — Progress Notes (Signed)
Acute Office Visit  Subjective:    Patient ID: Sarah Gallagher, female    DOB: 01-20-1981, 40 y.o.   MRN: [id number]  Chief Complaint  Patient presents with   Follow-up    Med follow up    HPI Patient is in today for med check.  She was started on Nurtec at her last visit for migraine prophylaxis.  She has hx of iron deficiency anemia, and her labs looked great.  She has had chronic hip pain for several years; seeing sports medicine now, but has seen PT, neurology, podiatry, and ortho in the past, and no obvious etiology for the pain. Has been taking tramadol.  She has had weight gain, but TSH was WNL. She was started on phentermine.  Past Medical History:  Diagnosis Date   Arthritis    Chest discomfort    a. associated with palpitations.   Depression    Dyspnea on exertion    a. 12/2016 Echo: EF 55-60%, no rwma, nl PASP.   Juvenile rheumatoid arthritis (HCC)    Meniere disease    Migraines    Neuropathy    Palpitations    PVC's (premature ventricular contractions)    a. 01/2017 48h Holter: Rare PAC/PVC. Freq runs of sinus tachycardia.    Past Surgical History:  Procedure Laterality Date   KNEE SURGERY Right 1997, 1997   torn meniscus, ACL replacement     REFRACTIVE SURGERY     lasix    Family History  Problem Relation Age of Onset   Diabetes Mother    Arthritis Father    Other Father        atrial flutter   Emphysema Father        smoker   Diabetes Maternal Aunt    Diabetes Maternal Uncle    Stroke Paternal Aunt    Arthritis Maternal Grandmother    Diabetes Maternal Grandmother    Cancer Maternal Grandmother        skin   Arthritis Paternal Grandmother    Cancer Paternal Grandmother        skin    Social History   Socioeconomic History   Marital status: Married    Spouse name: Not on file   Number of children: 3   Years of education: BS   Highest education level: Not on file  Occupational History    Comment: [employer], Resp  Therapist  Tobacco Use   Smoking status: Never   Smokeless tobacco: Never  Vaping Use   Vaping Use: Never used  Substance and Sexual Activity   Alcohol use: Not Currently    Alcohol/week: 0.0 standard drinks    Comment: rare   Drug use: No   Sexual activity: Yes    Birth control/protection: Injection  Other Topics Concern   Not on file  Social History Narrative   Lives in [place] with husband and children.  Not currently exercising.  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   Caffeine 2-3 cups daily   Social Determinants of Health   Financial Resource Strain: Not on file  Food Insecurity: Not on file  Transportation Needs: Not on file  Physical Activity: Not on file  Stress: Not on file  Social Connections: Not on file  Intimate Partner Violence: Not on file    Outpatient Medications Prior to Visit  Medication Sig Dispense Refill   aspirin-acetaminophen-caffeine (EXCEDRIN MIGRAINE) 250-250-65 MG tablet Take by mouth daily.     medroxyPROGESTERone Acetate 150 MG/ML SUSY INJECT 1  ML INTO THE MUSCLE EVERY 3 MONTHS. 1 mL 3   Rimegepant Sulfate (NURTEC) 75 MG TBDP Take 75 mg by mouth every other day. 16 tablet 3   rizatriptan (MAXALT-MLT) 5 MG disintegrating tablet Take 1 tablet (5 mg total) by mouth as needed for migraine. May repeat in 2 hours if needed 10 tablet 0   traMADol (ULTRAM) 50 MG tablet TAKE 1 TABLET BY MOUTH EVERY 12 HOURS AS NEEDED FOR SEVERE PAIN. 15 tablet 0   phentermine 15 MG capsule Take 1 capsule (15 mg total) by mouth every morning. 30 capsule 0   busPIRone (BUSPAR) 7.5 MG tablet TAKE 1 TABLET (7.5 MG TOTAL) BY MOUTH 2 (TWO) TIMES DAILY. (Patient not taking: Reported on 12/02/2020) 60 tablet 3   No facility-administered medications prior to visit.    Allergies  Allergen Reactions   Zithromax [Azithromycin] Other (See Comments)    "convulsions or tremors"    Review of Systems  Constitutional: Negative.   Respiratory: Negative.    Cardiovascular: Negative.    Musculoskeletal:  Positive for arthralgias.       Still has hip pain  Neurological:        Headaches are much improved since starting nurtec  Psychiatric/Behavioral: Negative.        Objective:    Physical Exam Constitutional:      Appearance: Normal appearance.  Cardiovascular:     Rate and Rhythm: Normal rate and regular rhythm.     Pulses: Normal pulses.     Heart sounds: Normal heart sounds.  Pulmonary:     Effort: Pulmonary effort is normal.     Breath sounds: Normal breath sounds.  Neurological:     Mental Status: She is alert.  Psychiatric:        Mood and Affect: Mood normal.        Behavior: Behavior normal.        Thought Content: Thought content normal.        Judgment: Judgment normal.    BP 109/71 (BP Location: Left Arm, Patient Position: Sitting, Cuff Size: Large)   Pulse 88   Temp 98.8 F (37.1 C) (Oral)   Ht '5\' 5"'  (1.651 m)   Wt 187 lb (84.8 kg)   SpO2 97%   BMI 31.12 kg/m  Wt Readings from Last 3 Encounters:  12/02/20 187 lb (84.8 kg)  10/29/20 192 lb (87.1 kg)  07/08/20 190 lb (86.2 kg)    There are no preventive care reminders to display for this patient.  There are no preventive care reminders to display for this patient.   Lab Results  Component Value Date   TSH 1.790 11/22/2020   Lab Results  Component Value Date   WBC 6.2 11/22/2020   HGB 13.9 11/22/2020   HCT 41.8 11/22/2020   MCV 90 11/22/2020   PLT 204 11/22/2020   Lab Results  Component Value Date   NA 140 11/22/2020   K 4.3 11/22/2020   CO2 20 11/22/2020   GLUCOSE 90 11/22/2020   BUN 14 11/22/2020   CREATININE 0.95 11/22/2020   BILITOT 0.4 11/22/2020   ALKPHOS 75 11/22/2020   AST 16 11/22/2020   ALT 13 11/22/2020   PROT 7.0 11/22/2020   ALBUMIN 4.4 11/22/2020   CALCIUM 9.4 11/22/2020   ANIONGAP 11 05/23/2019   EGFR 77 11/22/2020   GFR 62.94 07/12/2017   Lab Results  Component Value Date   CHOL 141 11/22/2020   Lab Results  Component Value Date   HDL  39  (L) 11/22/2020   Lab Results  Component Value Date   LDLCALC 91 11/22/2020   Lab Results  Component Value Date   TRIG 52 11/22/2020   Lab Results  Component Value Date   CHOLHDL 3.6 10/18/2018   Lab Results  Component Value Date   HGBA1C 5.8 (H) 05/23/2019       Assessment & Plan:   Problem List Items Addressed This Visit       Cardiovascular and Mediastinum   Migraine - Primary    -has taken imitrex and excedrin, but imitrex makes her feel bad -Continue maxalt for abortive therapy -has 10+ migraine days per month -Continue Nurtec QOD for prophylaxis; migraines much improved        Other   Chronic hip pain, left    -Has chronic pain for several years; seeing sports medicine now, but has seen PT, neurology, podiatry, and ortho in the past, and no obvious etiology for the pain -Continue tramadol      Iron deficiency    -iron panel was great  -no need for iron supplement      Weight gain    Wt Readings from Last 3 Encounters:  12/02/20 187 lb (84.8 kg)  10/29/20 192 lb (87.1 kg)  07/08/20 190 lb (86.2 kg)  -lost 5 pounds since her last OV; no side effects -INCREASE phentermine to 37.5 mg      Relevant Medications   phentermine 37.5 MG capsule     Meds ordered this encounter  Medications   phentermine 37.5 MG capsule    Sig: Take 1 capsule (37.5 mg total) by mouth every morning.    Dispense:  30 capsule    Refill:  0     Noreene Larsson, NP

## 2020-12-03 ENCOUNTER — Other Ambulatory Visit: Payer: Self-pay | Admitting: Family Medicine

## 2020-12-03 ENCOUNTER — Other Ambulatory Visit: Payer: Self-pay

## 2020-12-06 ENCOUNTER — Other Ambulatory Visit: Payer: Self-pay

## 2020-12-10 ENCOUNTER — Other Ambulatory Visit: Payer: Self-pay | Admitting: Nurse Practitioner

## 2020-12-10 ENCOUNTER — Telehealth: Payer: Self-pay

## 2020-12-10 ENCOUNTER — Other Ambulatory Visit: Payer: Self-pay

## 2020-12-10 DIAGNOSIS — G8929 Other chronic pain: Secondary | ICD-10-CM

## 2020-12-10 MED ORDER — TRAMADOL HCL 50 MG PO TABS
50.0000 mg | ORAL_TABLET | Freq: Two times a day (BID) | ORAL | 0 refills | Status: DC | PRN
Start: 2020-12-10 — End: 2021-02-03
  Filled 2020-12-10: qty 15, 8d supply, fill #0

## 2020-12-10 NOTE — Telephone Encounter (Signed)
I sent it in for her!

## 2020-12-10 NOTE — Telephone Encounter (Signed)
Patient called need med refill Rx #: [id number]  traMADol Veatrice Bourbon) 50 MG tablet [id number]  Pharmacy  [facility]  [address] [place] [address]  Phone:  [phone]  Fax:  [phone]  DEA #:  [id number]

## 2021-01-03 ENCOUNTER — Encounter: Payer: Self-pay | Admitting: Nurse Practitioner

## 2021-01-03 ENCOUNTER — Other Ambulatory Visit: Payer: Self-pay

## 2021-01-03 ENCOUNTER — Ambulatory Visit: Payer: 59 | Admitting: Nurse Practitioner

## 2021-01-03 VITALS — BP 112/76 | HR 76 | Temp 98.5°F | Ht 66.0 in | Wt 181.1 lb

## 2021-01-03 DIAGNOSIS — Z23 Encounter for immunization: Secondary | ICD-10-CM

## 2021-01-03 DIAGNOSIS — R635 Abnormal weight gain: Secondary | ICD-10-CM | POA: Diagnosis not present

## 2021-01-03 MED ORDER — PHENTERMINE HCL 37.5 MG PO CAPS
37.5000 mg | ORAL_CAPSULE | ORAL | 2 refills | Status: DC
Start: 2021-01-03 — End: 2021-04-11
  Filled 2021-01-03: qty 30, 30d supply, fill #0
  Filled 2021-02-03: qty 30, 30d supply, fill #1

## 2021-01-03 NOTE — Progress Notes (Signed)
Acute Office Visit  Subjective:    Patient ID: Sarah Gallagher, female    DOB: 03-17-1981, 40 y.o.   MRN: [id number]  Chief Complaint  Patient presents with   Medication Refill    Check up on weight loss meds     Medication Refill  Patient is in today for med check. At her last OV, we increased her phentermine to 37.5 mg. No adverse med effects reported.  For migraines, she is taking QOD nurtec and that has been helping.  Past Medical History:  Diagnosis Date   Arthritis    Chest discomfort    a. associated with palpitations.   Depression    Dyspnea on exertion    a. 12/2016 Echo: EF 55-60%, no rwma, nl PASP.   Juvenile rheumatoid arthritis (HCC)    Meniere disease    Migraines    Neuropathy    Palpitations    PVC's (premature ventricular contractions)    a. 01/2017 48h Holter: Rare PAC/PVC. Freq runs of sinus tachycardia.    Past Surgical History:  Procedure Laterality Date   KNEE SURGERY Right 1997, 1997   torn meniscus, ACL replacement     REFRACTIVE SURGERY     lasix    Family History  Problem Relation Age of Onset   Diabetes Mother    Arthritis Father    Other Father        atrial flutter   Emphysema Father        smoker   Diabetes Maternal Aunt    Diabetes Maternal Uncle    Stroke Paternal Aunt    Arthritis Maternal Grandmother    Diabetes Maternal Grandmother    Cancer Maternal Grandmother        skin   Arthritis Paternal Grandmother    Cancer Paternal Grandmother        skin    Social History   Socioeconomic History   Marital status: Married    Spouse name: Not on file   Number of children: 3   Years of education: BS   Highest education level: Not on file  Occupational History    Comment: [employer], Resp Therapist  Tobacco Use   Smoking status: Never   Smokeless tobacco: Never  Vaping Use   Vaping Use: Never used  Substance and Sexual Activity   Alcohol use: Not Currently    Alcohol/week: 0.0 standard drinks     Comment: rare   Drug use: No   Sexual activity: Yes    Birth control/protection: Injection  Other Topics Concern   Not on file  Social History Narrative   Lives in [place] with husband and children.  Not currently exercising.  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   Caffeine 2-3 cups daily   Social Determinants of Health   Financial Resource Strain: Not on file  Food Insecurity: Not on file  Transportation Needs: Not on file  Physical Activity: Not on file  Stress: Not on file  Social Connections: Not on file  Intimate Partner Violence: Not on file    Outpatient Medications Prior to Visit  Medication Sig Dispense Refill   aspirin-acetaminophen-caffeine (EXCEDRIN MIGRAINE) 250-250-65 MG tablet Take by mouth daily.     busPIRone (BUSPAR) 7.5 MG tablet TAKE 1 TABLET (7.5 MG TOTAL) BY MOUTH 2 (TWO) TIMES DAILY. 60 tablet 3   medroxyPROGESTERone Acetate 150 MG/ML SUSY INJECT 1 ML INTO THE MUSCLE EVERY 3 MONTHS. 1 mL 3   Rimegepant Sulfate (NURTEC) 75 MG TBDP Take  75 mg by mouth every other day. 16 tablet 3   rizatriptan (MAXALT-MLT) 5 MG disintegrating tablet Take 1 tablet (5 mg total) by mouth as needed for migraine. May repeat in 2 hours if needed 10 tablet 0   traMADol (ULTRAM) 50 MG tablet Take 1 tablet (50 mg total) by mouth every 12 (twelve) hours as needed for moderate pain or severe pain. 15 tablet 0   phentermine 37.5 MG capsule Take 1 capsule (37.5 mg total) by mouth every morning. 30 capsule 0   No facility-administered medications prior to visit.    Allergies  Allergen Reactions   Zithromax [Azithromycin] Other (See Comments)    "convulsions or tremors"    Review of Systems  Constitutional: Negative.   Respiratory: Negative.    Cardiovascular: Negative.   Musculoskeletal: Negative.   Psychiatric/Behavioral: Negative.        Objective:    Physical Exam Constitutional:      Appearance: Normal appearance.  Cardiovascular:     Rate and Rhythm: Normal rate and regular rhythm.      Pulses: Normal pulses.     Heart sounds: Normal heart sounds.  Pulmonary:     Effort: Pulmonary effort is normal.     Breath sounds: Normal breath sounds.  Musculoskeletal:        General: Normal range of motion.  Neurological:     Mental Status: She is alert.  Psychiatric:        Mood and Affect: Mood normal.        Behavior: Behavior normal.        Thought Content: Thought content normal.        Judgment: Judgment normal.    BP 112/76 (BP Location: Left Arm, Patient Position: Sitting)   Pulse 76   Temp 98.5 F (36.9 C) (Oral)   Ht '5\' 6"'  (1.676 m)   Wt 181 lb 1.9 oz (82.2 kg)   SpO2 96%   BMI 29.23 kg/m  Wt Readings from Last 3 Encounters:  01/03/21 181 lb 1.9 oz (82.2 kg)  12/02/20 187 lb (84.8 kg)  10/29/20 192 lb (87.1 kg)    Health Maintenance Due  Topic Date Due   COVID-19 Vaccine (2 - Booster for Janssen series) 02/10/2020    There are no preventive care reminders to display for this patient.   Lab Results  Component Value Date   TSH 1.790 11/22/2020   Lab Results  Component Value Date   WBC 6.2 11/22/2020   HGB 13.9 11/22/2020   HCT 41.8 11/22/2020   MCV 90 11/22/2020   PLT 204 11/22/2020   Lab Results  Component Value Date   NA 140 11/22/2020   K 4.3 11/22/2020   CO2 20 11/22/2020   GLUCOSE 90 11/22/2020   BUN 14 11/22/2020   CREATININE 0.95 11/22/2020   BILITOT 0.4 11/22/2020   ALKPHOS 75 11/22/2020   AST 16 11/22/2020   ALT 13 11/22/2020   PROT 7.0 11/22/2020   ALBUMIN 4.4 11/22/2020   CALCIUM 9.4 11/22/2020   ANIONGAP 11 05/23/2019   EGFR 77 11/22/2020   GFR 62.94 07/12/2017   Lab Results  Component Value Date   CHOL 141 11/22/2020   Lab Results  Component Value Date   HDL 39 (L) 11/22/2020   Lab Results  Component Value Date   LDLCALC 91 11/22/2020   Lab Results  Component Value Date   TRIG 52 11/22/2020   Lab Results  Component Value Date   CHOLHDL 3.6 10/18/2018  Lab Results  Component Value Date   HGBA1C  5.8 (H) 05/23/2019       Assessment & Plan:   Problem List Items Addressed This Visit       Other   Weight gain    Wt Readings from Last 3 Encounters:  01/03/21 181 lb 1.9 oz (82.2 kg)  12/02/20 187 lb (84.8 kg)  10/29/20 192 lb (87.1 kg)  -down 6 pounds since previous OV -continue phentermine       Relevant Medications   phentermine 37.5 MG capsule     Meds ordered this encounter  Medications   phentermine 37.5 MG capsule    Sig: Take 1 capsule (37.5 mg total) by mouth every morning.    Dispense:  30 capsule    Refill:  Portia, NP

## 2021-01-03 NOTE — Assessment & Plan Note (Signed)
Wt Readings from Last 3 Encounters:  01/03/21 181 lb 1.9 oz (82.2 kg)  12/02/20 187 lb (84.8 kg)  10/29/20 192 lb (87.1 kg)   -down 6 pounds since previous OV -continue phentermine

## 2021-01-07 ENCOUNTER — Encounter: Payer: Self-pay | Admitting: Nurse Practitioner

## 2021-01-10 NOTE — Telephone Encounter (Signed)
Faxed

## 2021-02-03 ENCOUNTER — Other Ambulatory Visit: Payer: Self-pay

## 2021-02-03 ENCOUNTER — Other Ambulatory Visit: Payer: Self-pay | Admitting: Nurse Practitioner

## 2021-02-03 DIAGNOSIS — G8929 Other chronic pain: Secondary | ICD-10-CM

## 2021-02-03 MED FILL — Tramadol HCl Tab 50 MG: ORAL | 8 days supply | Qty: 15 | Fill #0 | Status: AC

## 2021-02-04 ENCOUNTER — Other Ambulatory Visit: Payer: Self-pay

## 2021-03-11 ENCOUNTER — Other Ambulatory Visit: Payer: Self-pay

## 2021-03-11 ENCOUNTER — Other Ambulatory Visit: Payer: Self-pay | Admitting: Nurse Practitioner

## 2021-03-11 DIAGNOSIS — G8929 Other chronic pain: Secondary | ICD-10-CM

## 2021-03-11 DIAGNOSIS — M25552 Pain in left hip: Secondary | ICD-10-CM

## 2021-03-11 MED ORDER — TRAMADOL HCL 50 MG PO TABS
50.0000 mg | ORAL_TABLET | Freq: Two times a day (BID) | ORAL | 0 refills | Status: DC | PRN
  Filled 2021-03-11: qty 15, 8d supply, fill #0

## 2021-03-24 ENCOUNTER — Other Ambulatory Visit: Payer: Self-pay

## 2021-04-11 ENCOUNTER — Encounter: Payer: Self-pay | Admitting: Internal Medicine

## 2021-04-11 ENCOUNTER — Other Ambulatory Visit: Payer: Self-pay

## 2021-04-11 ENCOUNTER — Ambulatory Visit: Payer: 59 | Admitting: Nurse Practitioner

## 2021-04-11 ENCOUNTER — Ambulatory Visit: Payer: 59 | Admitting: Internal Medicine

## 2021-04-11 VITALS — BP 112/78 | HR 94 | Resp 18 | Ht 65.0 in | Wt 180.1 lb

## 2021-04-11 DIAGNOSIS — G8929 Other chronic pain: Secondary | ICD-10-CM | POA: Diagnosis not present

## 2021-04-11 DIAGNOSIS — F419 Anxiety disorder, unspecified: Secondary | ICD-10-CM

## 2021-04-11 DIAGNOSIS — F32A Depression, unspecified: Secondary | ICD-10-CM

## 2021-04-11 DIAGNOSIS — E663 Overweight: Secondary | ICD-10-CM | POA: Diagnosis not present

## 2021-04-11 DIAGNOSIS — M25552 Pain in left hip: Secondary | ICD-10-CM | POA: Diagnosis not present

## 2021-04-11 DIAGNOSIS — G43009 Migraine without aura, not intractable, without status migrainosus: Secondary | ICD-10-CM

## 2021-04-11 MED ORDER — PHENTERMINE HCL 37.5 MG PO CAPS
37.5000 mg | ORAL_CAPSULE | ORAL | 2 refills | Status: DC
  Filled 2021-04-11 – 2021-05-30 (×2): qty 30, 30d supply, fill #0
  Filled 2021-07-07: qty 30, 30d supply, fill #1

## 2021-04-11 MED ORDER — TRAMADOL HCL 50 MG PO TABS
50.0000 mg | ORAL_TABLET | Freq: Two times a day (BID) | ORAL | 2 refills | Status: DC | PRN
  Filled 2021-04-11 – 2021-05-30 (×2): qty 15, 8d supply, fill #0
  Filled 2021-07-07: qty 15, 8d supply, fill #1

## 2021-04-11 NOTE — Assessment & Plan Note (Signed)
On Phentermine, tolerating well Has lost about 12 lbs since starting in 11/2020 Will continue phentermine for now, refilled after reviewing PDMP Advised to continue to follow low-carb diet

## 2021-04-11 NOTE — Assessment & Plan Note (Addendum)
She had concern for ADHD, but it would be very atypical late presentation Offered [facility] therapy referral, she prefers to wait for now She feels better with phentermine- she thought she felt better with a stimulant, so she could have ADHD - I doubt it currently, appears to be related to her sense of wellbeing with weight loss She has not tolerated multiple antidepressants in the past, including Lexapro, Effexor, BuSpar etc. She prefers to continue relaxation techniques at home for now.

## 2021-04-11 NOTE — Patient Instructions (Signed)
Please continue taking medications as prescribed.  Please continue to follow low carb diet and perform moderate exercise/walking at least 150 mins/week. 

## 2021-04-11 NOTE — Progress Notes (Signed)
Established Patient Office Visit  Subjective:  Patient ID: Sarah Gallagher, female    DOB: 05-Jul-1980  Age: 41 y.o. MRN: [id number]  CC:  Chief Complaint  Patient presents with   Follow-up    3 month follow up phentermine works good she didn't take through [place] as should but its working good also would like medication for adhd     HPI Sarah Gallagher is a 41 y.o. female with past medical history of migraine, chronic hip pain and depression with anxiety who presents for f/u of her chronic medical conditions.  She is taking phentermine for weight loss and has been tolerating it well.  She has lost about 12 pounds since starting it in 11/2020.  She reports missing some doses around holidays.  She feels better overall with the weight loss.  She has history of chronic hip pain, for which she has seen sports medicine, orthopedic surgeon and podiatrist in the past.  She takes tramadol as needed for severe pain.  She states that she is not able to run properly and maintain her routine cardiac exercises due to chronic hip pain, which is made her anxiety worse.  She takes Nurtec for migraine and has been feeling better now.  She asked about possible ADHD as she has felt better with phentermine.  I had lengthy discussion about her symptoms, which are likely better due to her ability to function optimally with recent weight loss.  I offered [place] therapy referral, but she prefers to wait for now.  Past Medical History:  Diagnosis Date   Arthritis    Chest discomfort    a. associated with palpitations.   Depression    Dyspnea on exertion    a. 12/2016 Echo: EF 55-60%, no rwma, nl PASP.   Juvenile rheumatoid arthritis (HCC)    Meniere disease    Migraines    Neuropathy    Palpitations    PVC's (premature ventricular contractions)    a. 01/2017 48h Holter: Rare PAC/PVC. Freq runs of sinus tachycardia.    Past Surgical History:  Procedure Laterality Date   KNEE SURGERY Right 1997, 1997    torn meniscus, ACL replacement     REFRACTIVE SURGERY     lasix    Family History  Problem Relation Age of Onset   Diabetes Mother    Arthritis Father    Other Father        atrial flutter   Emphysema Father        smoker   Diabetes Maternal Aunt    Diabetes Maternal Uncle    Stroke Paternal Aunt    Arthritis Maternal Grandmother    Diabetes Maternal Grandmother    Cancer Maternal Grandmother        skin   Arthritis Paternal Grandmother    Cancer Paternal Grandmother        skin    Social History   Socioeconomic History   Marital status: Married    Spouse name: Not on file   Number of children: 3   Years of education: BS   Highest education level: Not on file  Occupational History    Comment: [employer], Resp Therapist  Tobacco Use   Smoking status: Never   Smokeless tobacco: Never  Vaping Use   Vaping Use: Never used  Substance and Sexual Activity   Alcohol use: Not Currently    Alcohol/week: 0.0 standard drinks    Comment: rare   Drug use: No  Sexual activity: Yes    Birth control/protection: Injection  Other Topics Concern   Not on file  Social History Narrative   Lives in [place] with husband and children.  Not currently exercising.  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   Caffeine 2-3 cups daily   Social Determinants of Health   Financial Resource Strain: Not on file  Food Insecurity: Not on file  Transportation Needs: Not on file  Physical Activity: Not on file  Stress: Not on file  Social Connections: Not on file  Intimate Partner Violence: Not on file    Outpatient Medications Prior to Visit  Medication Sig Dispense Refill   medroxyPROGESTERone Acetate 150 MG/ML SUSY INJECT 1 ML INTO THE MUSCLE EVERY 3 MONTHS. 1 mL 3   Rimegepant Sulfate (NURTEC) 75 MG TBDP Take 75 mg by mouth every other day. 16 tablet 3   rizatriptan (MAXALT-MLT) 5 MG disintegrating tablet Take 1 tablet (5 mg total) by mouth as needed for migraine. May repeat in 2 hours if  needed 10 tablet 0   phentermine 37.5 MG capsule Take 1 capsule (37.5 mg total) by mouth every morning. 30 capsule 2   traMADol (ULTRAM) 50 MG tablet Take 1 tablet (50 mg total) by mouth every 12 (twelve) hours as needed for moderate pain or severe pain. 15 tablet 0   aspirin-acetaminophen-caffeine (EXCEDRIN MIGRAINE) 295-621-30 MG tablet Take by mouth daily. (Patient not taking: Reported on 04/11/2021)     No facility-administered medications prior to visit.    Allergies  Allergen Reactions   Zithromax [Azithromycin] Other (See Comments)    "convulsions or tremors"    ROS Review of Systems  Constitutional:  Negative for chills and fever.  HENT:  Negative for congestion, sinus pressure, sinus pain and sore throat.   Eyes:  Negative for pain and discharge.  Respiratory:  Negative for cough and shortness of breath.   Cardiovascular:  Negative for chest pain and palpitations.  Gastrointestinal:  Negative for abdominal pain, diarrhea, nausea and vomiting.  Endocrine: Negative for polydipsia and polyuria.  Genitourinary:  Negative for dysuria and hematuria.  Musculoskeletal:  Positive for arthralgias. Negative for neck pain and neck stiffness.  Skin:  Negative for rash.  Neurological:  Positive for headaches. Negative for dizziness and weakness.  Psychiatric/Behavioral:  Positive for dysphoric mood. Negative for agitation and behavioral problems. The patient is nervous/anxious. The patient is not hyperactive.      Objective:    Physical Exam Vitals reviewed.  Constitutional:      General: She is not in acute distress.    Appearance: She is not diaphoretic.  HENT:     Head: Normocephalic and atraumatic.     Nose: Nose normal. No congestion.     Mouth/Throat:     Mouth: Mucous membranes are moist.     Pharynx: No posterior oropharyngeal erythema.  Eyes:     General: No scleral icterus.    Extraocular Movements: Extraocular movements intact.  Cardiovascular:     Rate and Rhythm:  Normal rate and regular rhythm.     Heart sounds: No murmur heard. Pulmonary:     Breath sounds: Normal breath sounds. No wheezing or rales.  Musculoskeletal:     Cervical back: Neck supple. No tenderness.     Right lower leg: No edema.     Left lower leg: No edema.  Skin:    General: Skin is warm.     Findings: No rash.  Neurological:     General: No focal deficit present.  Mental Status: She is alert and oriented to person, place, and time.  Psychiatric:        Mood and Affect: Mood normal.        Behavior: Behavior normal.    BP 112/78 (BP Location: Right Arm, Patient Position: Sitting, Cuff Size: Normal)    Pulse 94    Resp 18    Ht '5\' 5"'  (1.651 m)    Wt 180 lb 1.3 oz (81.7 kg)    SpO2 98%    BMI 29.97 kg/m  Wt Readings from Last 3 Encounters:  04/11/21 180 lb 1.3 oz (81.7 kg)  01/03/21 181 lb 1.9 oz (82.2 kg)  12/02/20 187 lb (84.8 kg)    Lab Results  Component Value Date   TSH 1.790 11/22/2020   Lab Results  Component Value Date   WBC 6.2 11/22/2020   HGB 13.9 11/22/2020   HCT 41.8 11/22/2020   MCV 90 11/22/2020   PLT 204 11/22/2020   Lab Results  Component Value Date   NA 140 11/22/2020   K 4.3 11/22/2020   CO2 20 11/22/2020   GLUCOSE 90 11/22/2020   BUN 14 11/22/2020   CREATININE 0.95 11/22/2020   BILITOT 0.4 11/22/2020   ALKPHOS 75 11/22/2020   AST 16 11/22/2020   ALT 13 11/22/2020   PROT 7.0 11/22/2020   ALBUMIN 4.4 11/22/2020   CALCIUM 9.4 11/22/2020   ANIONGAP 11 05/23/2019   EGFR 77 11/22/2020   GFR 62.94 07/12/2017   Lab Results  Component Value Date   CHOL 141 11/22/2020   Lab Results  Component Value Date   HDL 39 (L) 11/22/2020   Lab Results  Component Value Date   LDLCALC 91 11/22/2020   Lab Results  Component Value Date   TRIG 52 11/22/2020   Lab Results  Component Value Date   CHOLHDL 3.6 10/18/2018   Lab Results  Component Value Date   HGBA1C 5.8 (H) 05/23/2019      Assessment & Plan:   Problem List Items  Addressed This Visit       Cardiovascular and Mediastinum   Migraine    Well controlled with Nurtec as needed      Relevant Medications   traMADol (ULTRAM) 50 MG tablet     Other   Anxiety and depression    She had concern for ADHD, but it would be very atypical late presentation Offered [facility] therapy referral, she prefers to wait for now She feels better with phentermine- she thought she felt better with a stimulant, so she could have ADHD - I doubt it currently, appears to be related to her sense of wellbeing with weight loss She has not tolerated multiple antidepressants in the past, including Lexapro, Effexor, BuSpar etc. She prefers to continue relaxation techniques at home for now.      Chronic hip pain, left    Chronic hip pain, worse with exertion Takes tramadol as needed for severe pain, refilled for now      Relevant Medications   traMADol (ULTRAM) 50 MG tablet   Overweight - Primary    On Phentermine, tolerating well Has lost about 12 lbs since starting in 11/2020 Will continue phentermine for now, refilled after reviewing PDMP Advised to continue to follow low-carb diet      Relevant Medications   phentermine 37.5 MG capsule   Other Visit Diagnoses     Weight gain       Relevant Medications   phentermine 37.5 MG capsule  Meds ordered this encounter  Medications   phentermine 37.5 MG capsule    Sig: Take 1 capsule (37.5 mg total) by mouth every morning.    Dispense:  30 capsule    Refill:  2   traMADol (ULTRAM) 50 MG tablet    Sig: Take 1 tablet (50 mg total) by mouth every 12 (twelve) hours as needed for moderate pain or severe pain.    Dispense:  15 tablet    Refill:  2    Follow-up: Return in about 3 months (around 07/10/2021) for Weight management and anxiety.    Lindell Spar, MD

## 2021-04-11 NOTE — Assessment & Plan Note (Signed)
Chronic hip pain, worse with exertion Takes tramadol as needed for severe pain, refilled for now

## 2021-04-11 NOTE — Assessment & Plan Note (Signed)
Well controlled with Nurtec as needed 

## 2021-04-18 ENCOUNTER — Other Ambulatory Visit: Payer: Self-pay

## 2021-04-18 ENCOUNTER — Other Ambulatory Visit: Payer: Self-pay | Admitting: Internal Medicine

## 2021-04-18 DIAGNOSIS — G43009 Migraine without aura, not intractable, without status migrainosus: Secondary | ICD-10-CM

## 2021-04-18 MED ORDER — NURTEC 75 MG PO TBDP
75.0000 mg | ORAL_TABLET | ORAL | 3 refills | Status: DC
  Filled 2021-04-18 – 2021-05-30 (×2): qty 16, 30d supply, fill #0
  Filled 2021-07-07: qty 16, 30d supply, fill #1
  Filled 2021-08-26: qty 16, 30d supply, fill #2

## 2021-04-29 ENCOUNTER — Other Ambulatory Visit: Payer: Self-pay

## 2021-05-02 ENCOUNTER — Other Ambulatory Visit (HOSPITAL_BASED_OUTPATIENT_CLINIC_OR_DEPARTMENT_OTHER): Payer: Self-pay

## 2021-05-09 ENCOUNTER — Telehealth: Payer: Self-pay | Admitting: Internal Medicine

## 2021-05-09 ENCOUNTER — Other Ambulatory Visit: Payer: Self-pay | Admitting: *Deleted

## 2021-05-09 DIAGNOSIS — G43009 Migraine without aura, not intractable, without status migrainosus: Secondary | ICD-10-CM

## 2021-05-09 MED ORDER — NURTEC 75 MG PO TBDP: 75.0000 mg | ORAL_TABLET | ORAL | 3 refills | Status: DC

## 2021-05-09 NOTE — Telephone Encounter (Signed)
Can you please find out the name of the [facility] pharmacy and let me know

## 2021-05-09 NOTE — Telephone Encounter (Signed)
[facility]

## 2021-05-09 NOTE — Telephone Encounter (Signed)
Melissa with [facility] called in on pt behalf   Pt needs refill on   Rimegepant Sulfate ([place]) 75 MG TBDP   Sent in to [facility] back [facility] w/ [place]  Phone [phone] Fax [phone]

## 2021-05-09 NOTE — Telephone Encounter (Signed)
Medication sent to [facility]

## 2021-05-17 ENCOUNTER — Other Ambulatory Visit: Payer: Self-pay

## 2021-05-30 ENCOUNTER — Other Ambulatory Visit (HOSPITAL_BASED_OUTPATIENT_CLINIC_OR_DEPARTMENT_OTHER): Payer: Self-pay

## 2021-07-07 ENCOUNTER — Other Ambulatory Visit (HOSPITAL_BASED_OUTPATIENT_CLINIC_OR_DEPARTMENT_OTHER): Payer: Self-pay

## 2021-07-08 ENCOUNTER — Encounter (HOSPITAL_BASED_OUTPATIENT_CLINIC_OR_DEPARTMENT_OTHER): Payer: Self-pay

## 2021-07-08 ENCOUNTER — Other Ambulatory Visit (HOSPITAL_BASED_OUTPATIENT_CLINIC_OR_DEPARTMENT_OTHER): Payer: Self-pay

## 2021-07-11 ENCOUNTER — Ambulatory Visit: Payer: 59 | Admitting: Internal Medicine

## 2021-07-11 ENCOUNTER — Other Ambulatory Visit (HOSPITAL_BASED_OUTPATIENT_CLINIC_OR_DEPARTMENT_OTHER): Payer: Self-pay

## 2021-07-12 ENCOUNTER — Other Ambulatory Visit (HOSPITAL_BASED_OUTPATIENT_CLINIC_OR_DEPARTMENT_OTHER): Payer: Self-pay

## 2021-07-14 ENCOUNTER — Other Ambulatory Visit (HOSPITAL_BASED_OUTPATIENT_CLINIC_OR_DEPARTMENT_OTHER): Payer: Self-pay

## 2021-07-18 ENCOUNTER — Ambulatory Visit: Payer: 59 | Admitting: Internal Medicine

## 2021-07-18 ENCOUNTER — Encounter: Payer: Self-pay | Admitting: Internal Medicine

## 2021-07-18 VITALS — BP 119/82 | HR 97 | Ht 65.0 in | Wt 178.6 lb

## 2021-07-18 DIAGNOSIS — Z0000 Encounter for general adult medical examination without abnormal findings: Secondary | ICD-10-CM

## 2021-07-18 DIAGNOSIS — F32A Depression, unspecified: Secondary | ICD-10-CM

## 2021-07-18 DIAGNOSIS — F419 Anxiety disorder, unspecified: Secondary | ICD-10-CM

## 2021-07-18 DIAGNOSIS — G43009 Migraine without aura, not intractable, without status migrainosus: Secondary | ICD-10-CM | POA: Diagnosis not present

## 2021-07-18 DIAGNOSIS — E663 Overweight: Secondary | ICD-10-CM

## 2021-07-18 NOTE — Patient Instructions (Signed)
Please continue to take medications as prescribed.  Please continue to follow low carb diet and ambulate as tolerated. 

## 2021-07-19 ENCOUNTER — Other Ambulatory Visit (HOSPITAL_BASED_OUTPATIENT_CLINIC_OR_DEPARTMENT_OTHER): Payer: Self-pay

## 2021-07-21 NOTE — Assessment & Plan Note (Signed)
She had concern for ADHD, but it would be very atypical late presentation ?Referred to psychiatry ?She feels better with phentermine- she thought she felt better with a stimulant, so she could have ADHD - I doubt it currently, appears to be related to her sense of wellbeing with weight loss ?She has not tolerated multiple antidepressants in the past, including Lexapro, Effexor, BuSpar etc. ?She prefers to continue relaxation techniques at home for now. ?

## 2021-07-21 NOTE — Progress Notes (Signed)
? ?Established Patient Office Visit ? ?Subjective:  ?Patient ID: Sarah Gallagher, female    DOB: 1980-06-05  Age: 41 y.o. MRN: [id number] ? ?CC:  ?Chief Complaint  ?Patient presents with  ? Follow-up  ?  Pt would a refill on Nyrtec, needs yearly PA ran on this.   ? ? ?HPI ?Sarah Gallagher is a 41 y.o. female with past medical history of migraine, chronic hip pain and depression with anxiety who presents for f/u of her chronic medical conditions. ? ?She is taking phentermine for weight loss and has been tolerating it well.  She has lost about 14 pounds since starting it in 11/2020.  She reports missing some doses around holidays.  She feels better overall with the weight loss. ? ?She takes Nurtec for migraine and has been feeling better now. ? ?She asked about possible ADHD as she has felt better with phentermine.  She has better concentration and functional status with phentermine.  She agrees to be referred to psychiatry now.  She denies any anhedonia, SI or HI currently. ? ?Past Medical History:  ?Diagnosis Date  ? Arthritis   ? Chest discomfort   ? a. associated with palpitations.  ? Depression   ? Dyspnea on exertion   ? a. 12/2016 Echo: EF 55-60%, no rwma, nl PASP.  ? Juvenile rheumatoid arthritis ([place])   ? Meniere disease   ? Migraines   ? Neuropathy   ? Palpitations   ? PVC's (premature ventricular contractions)   ? a. 01/2017 48h Holter: Rare PAC/PVC. Freq runs of sinus tachycardia.  ? ? ?Past Surgical History:  ?Procedure Laterality Date  ? KNEE SURGERY Right 1997, 1997  ? torn meniscus, ACL replacement    ? REFRACTIVE SURGERY    ? lasix  ? ? ?Family History  ?Problem Relation Age of Onset  ? Diabetes Mother   ? Arthritis Father   ? Other Father   ?     atrial flutter  ? Emphysema Father   ?     smoker  ? Diabetes Maternal Aunt   ? Diabetes Maternal Uncle   ? Stroke Paternal Aunt   ? Arthritis Maternal Grandmother   ? Diabetes Maternal Grandmother   ? Cancer Maternal Grandmother   ?     skin  ? Arthritis  Paternal Grandmother   ? Cancer Paternal Grandmother   ?     skin  ? ? ?Social History  ? ?Socioeconomic History  ? Marital status: Married  ?  Spouse name: Not on file  ? Number of children: 3  ? Years of education: BS  ? Highest education level: Not on file  ?Occupational History  ?  Comment: [employer], Resp Therapist  ?Tobacco Use  ? Smoking status: Never  ? Smokeless tobacco: Never  ?Vaping Use  ? Vaping Use: Never used  ?Substance and Sexual Activity  ? Alcohol use: Not Currently  ?  Alcohol/week: 0.0 standard drinks  ?  Comment: rare  ? Drug use: No  ? Sexual activity: Yes  ?  Birth control/protection: Injection  ?Other Topics Concern  ? Not on file  ?Social History Narrative  ? Lives in [place] with husband and children.  Not currently exercising.  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  ? Caffeine 2-3 cups daily  ? ?Social Determinants of Health  ? ?Financial Resource Strain: Not on file  ?Food Insecurity: Not on file  ?Transportation Needs: Not on file  ?Physical Activity: Not on file  ?  Stress: Not on file  ?Social Connections: Not on file  ?Intimate Partner Violence: Not on file  ? ? ?Outpatient Medications Prior to Visit  ?Medication Sig Dispense Refill  ? aspirin-acetaminophen-caffeine (EXCEDRIN MIGRAINE) 250-250-65 MG tablet Take by mouth daily.    ? medroxyPROGESTERone Acetate 150 MG/ML SUSY INJECT 1 ML INTO THE MUSCLE EVERY 3 MONTHS. 1 mL 3  ? phentermine 37.5 MG capsule Take 1 capsule (37.5 mg total) by mouth every morning. 30 capsule 2  ? Rimegepant Sulfate (NURTEC) 75 MG TBDP Take 75 mg by mouth every other day. 16 tablet 3  ? Rimegepant Sulfate (NURTEC) 75 MG TBDP Take 75 mg by mouth every other day. 16 tablet 3  ? rizatriptan (MAXALT-MLT) 5 MG disintegrating tablet Take 1 tablet (5 mg total) by mouth as needed for migraine. May repeat in 2 hours if needed 10 tablet 0  ? traMADol (ULTRAM) 50 MG tablet Take 1 tablet (50 mg total) by mouth every 12 (twelve) hours as needed for moderate pain or severe  pain. 15 tablet 2  ? ?No facility-administered medications prior to visit.  ? ? ?Allergies  ?Allergen Reactions  ? Zithromax [Azithromycin] Other (See Comments)  ?  "convulsions or tremors"  ? ? ?ROS ?Review of Systems  ?Constitutional:  Negative for chills and fever.  ?HENT:  Negative for congestion, sinus pressure, sinus pain and sore throat.   ?Eyes:  Negative for pain and discharge.  ?Respiratory:  Negative for cough and shortness of breath.   ?Cardiovascular:  Negative for chest pain and palpitations.  ?Gastrointestinal:  Negative for abdominal pain, diarrhea, nausea and vomiting.  ?Endocrine: Negative for polydipsia and polyuria.  ?Genitourinary:  Negative for dysuria and hematuria.  ?Musculoskeletal:  Positive for arthralgias. Negative for neck pain and neck stiffness.  ?Skin:  Negative for rash.  ?Neurological:  Positive for headaches. Negative for dizziness and weakness.  ?Psychiatric/Behavioral:  Negative for agitation and behavioral problems. The patient is nervous/anxious. The patient is not hyperactive.   ? ?  ?Objective:  ?  ?Physical Exam ?Vitals reviewed.  ?Constitutional:   ?   General: She is not in acute distress. ?   Appearance: She is not diaphoretic.  ?HENT:  ?   Head: Normocephalic and atraumatic.  ?   Nose: Nose normal. No congestion.  ?   Mouth/Throat:  ?   Mouth: Mucous membranes are moist.  ?   Pharynx: No posterior oropharyngeal erythema.  ?Eyes:  ?   General: No scleral icterus. ?   Extraocular Movements: Extraocular movements intact.  ?Cardiovascular:  ?   Rate and Rhythm: Normal rate and regular rhythm.  ?   Pulses: Normal pulses.  ?   Heart sounds: Normal heart sounds. No murmur heard. ?Pulmonary:  ?   Breath sounds: Normal breath sounds. No wheezing or rales.  ?Musculoskeletal:  ?   Cervical back: Neck supple. No tenderness.  ?   Right lower leg: No edema.  ?   Left lower leg: No edema.  ?Skin: ?   General: Skin is warm.  ?   Findings: No rash.  ?Neurological:  ?   General: No focal  deficit present.  ?   Mental Status: She is alert and oriented to person, place, and time.  ?Psychiatric:     ?   Mood and Affect: Mood normal.     ?   Behavior: Behavior normal.  ? ? ?BP 119/82 (BP Location: Right Arm, Patient Position: Sitting)   Pulse 97   Ht 5'  5" (1.651 m)   Wt 178 lb 9.6 oz (81 kg)   SpO2 98%   BMI 29.72 kg/m?  ?Wt Readings from Last 3 Encounters:  ?07/18/21 178 lb 9.6 oz (81 kg)  ?04/11/21 180 lb 1.3 oz (81.7 kg)  ?01/03/21 181 lb 1.9 oz (82.2 kg)  ? ? ?Lab Results  ?Component Value Date  ? TSH 1.790 11/22/2020  ? ?Lab Results  ?Component Value Date  ? WBC 6.2 11/22/2020  ? HGB 13.9 11/22/2020  ? HCT 41.8 11/22/2020  ? MCV 90 11/22/2020  ? PLT 204 11/22/2020  ? ?Lab Results  ?Component Value Date  ? NA 140 11/22/2020  ? K 4.3 11/22/2020  ? CO2 20 11/22/2020  ? GLUCOSE 90 11/22/2020  ? BUN 14 11/22/2020  ? CREATININE 0.95 11/22/2020  ? BILITOT 0.4 11/22/2020  ? ALKPHOS 75 11/22/2020  ? AST 16 11/22/2020  ? ALT 13 11/22/2020  ? PROT 7.0 11/22/2020  ? ALBUMIN 4.4 11/22/2020  ? CALCIUM 9.4 11/22/2020  ? ANIONGAP 11 05/23/2019  ? EGFR 77 11/22/2020  ? GFR 62.94 07/12/2017  ? ?Lab Results  ?Component Value Date  ? CHOL 141 11/22/2020  ? ?Lab Results  ?Component Value Date  ? HDL 39 (L) 11/22/2020  ? ?Lab Results  ?Component Value Date  ? [place] 91 11/22/2020  ? ?Lab Results  ?Component Value Date  ? TRIG 52 11/22/2020  ? ?Lab Results  ?Component Value Date  ? CHOLHDL 3.6 10/18/2018  ? ?Lab Results  ?Component Value Date  ? HGBA1C 5.8 (H) 05/23/2019  ? ? ?  ?Assessment & Plan:  ? ?Problem List Items Addressed This Visit   ? ?  ? Cardiovascular and Mediastinum  ? Migraine - Primary  ?  Well controlled with Nurtec as needed ? ?  ?  ?  ? Other  ? Anxiety and depression  ?  She had concern for ADHD, but it would be very atypical late presentation ?Referred to psychiatry ?She feels better with phentermine- she thought she felt better with a stimulant, so she could have ADHD - I doubt it currently,  appears to be related to her sense of wellbeing with weight loss ?She has not tolerated multiple antidepressants in the past, including Lexapro, Effexor, BuSpar etc. ?She prefers to continue relaxation techniques

## 2021-07-21 NOTE — Assessment & Plan Note (Signed)
On Phentermine, tolerating well ?Has lost about 14 lbs since starting in 11/2020 ?Will continue phentermine for now, refilled after reviewing PDMP ?Advised to continue to follow low-carb diet ?

## 2021-07-21 NOTE — Assessment & Plan Note (Signed)
Well controlled with Nurtec as needed 

## 2021-07-29 ENCOUNTER — Other Ambulatory Visit (HOSPITAL_BASED_OUTPATIENT_CLINIC_OR_DEPARTMENT_OTHER): Payer: Self-pay

## 2021-08-01 ENCOUNTER — Other Ambulatory Visit (HOSPITAL_BASED_OUTPATIENT_CLINIC_OR_DEPARTMENT_OTHER): Payer: Self-pay

## 2021-08-25 DIAGNOSIS — H938X3 Other specified disorders of ear, bilateral: Secondary | ICD-10-CM | POA: Diagnosis not present

## 2021-08-25 DIAGNOSIS — R2689 Other abnormalities of gait and mobility: Secondary | ICD-10-CM | POA: Diagnosis not present

## 2021-08-25 DIAGNOSIS — H93293 Other abnormal auditory perceptions, bilateral: Secondary | ICD-10-CM | POA: Insufficient documentation

## 2021-08-25 DIAGNOSIS — H9313 Tinnitus, bilateral: Secondary | ICD-10-CM | POA: Insufficient documentation

## 2021-08-26 ENCOUNTER — Other Ambulatory Visit (HOSPITAL_BASED_OUTPATIENT_CLINIC_OR_DEPARTMENT_OTHER): Payer: Self-pay

## 2021-08-26 ENCOUNTER — Other Ambulatory Visit: Payer: Self-pay | Admitting: Internal Medicine

## 2021-08-26 DIAGNOSIS — E663 Overweight: Secondary | ICD-10-CM

## 2021-08-26 MED ORDER — PHENTERMINE HCL 37.5 MG PO CAPS
37.5000 mg | ORAL_CAPSULE | ORAL | 2 refills | Status: DC
  Filled 2021-08-26: qty 30, 30d supply, fill #0
  Filled 2021-09-22 – 2021-10-07 (×2): qty 30, 30d supply, fill #1
  Filled 2021-11-08: qty 30, 30d supply, fill #2

## 2021-09-04 ENCOUNTER — Telehealth: Payer: 59 | Admitting: Nurse Practitioner

## 2021-09-04 DIAGNOSIS — S30860D Insect bite (nonvenomous) of lower back and pelvis, subsequent encounter: Secondary | ICD-10-CM | POA: Diagnosis not present

## 2021-09-04 DIAGNOSIS — W57XXXD Bitten or stung by nonvenomous insect and other nonvenomous arthropods, subsequent encounter: Secondary | ICD-10-CM | POA: Diagnosis not present

## 2021-09-04 MED ORDER — DOXYCYCLINE HYCLATE 100 MG PO TABS
200.0000 mg | ORAL_TABLET | Freq: Once | ORAL | 0 refills | Status: AC
  Filled 2021-09-04: qty 2, 1d supply, fill #0

## 2021-09-04 NOTE — Progress Notes (Signed)
I have spent 5 minutes in review of e-visit questionnaire, review and updating patient chart, medical decision making and response to patient.  ° °Niana Martorana W Antaeus Karel, NP ° °  °

## 2021-09-04 NOTE — Progress Notes (Signed)
E-Visit for Tick Bite  Thank you for describing your tick bite, Here is how we plan to help! Based on the information that you shared with me it looks like you have A tick that bite that we will treat with a short course of doxycycline.  In most cases a tick bite is painless and does not itch.  Most tick bites in which the tick is quickly removed do not require prescriptions. Ticks can transmit several diseases if they are infected and remain attacked to your skin. Therefore the length that the tick was attached and any symptoms you have experienced after the bite are import to accurately develop your custom treatment plan. In most cases a single dose of doxycycline may prevent the development of a more serious condition.  Based on your information I have I have sent a single dose of doxycycline to the pharmacy you selected. Please make sure that you selected a pharmacy that is open now.  Which ticks  are associated with illness?  The Wood Tick (dog tick) is the size of a watermelon seed and can sometimes transmit [facility] spotted fever and [place] tick fever.   The Deer Tick (black-legged tick) is between the size of a poppy seed (pin head) and an apple seed, and can sometimes transmit Lyme disease.  A brown to black tick with a white splotch on its back is likely a female Amblyomma americanum ([place] tick). This tick has been associated with Southern Tick Associated illness ( STARI)  Lyme disease has become the most common tick-borne illness in the [place]. The risk of Lyme disease following a recognized deer tick bite is estimated to be 1%.  The majority of cases of Lyme disease start with a bull's eye rash at the site of the tick bite. The rash can occur days to weeks (typically 7-10 days) after a tick bite. Treatment with antibiotics is indicated if this rash appears. Flu-like symptoms may accompany the rash, including: fever, chills, headaches, muscle aches, and  fatigue. Removing ticks promptly may prevent tick borne disease.  What can be used to prevent Tick Bites?  Insect repellant with at leas 20% DEET. Wearing long pants with sock and shoes. Avoiding tall grass and heavily wooded areas. Checking your skin after being outdoors. Shower with a washcloth after outdoor exposures.  HOME CARE ADVICE FOR TICK BITE  Wood Tick Removal:  Use a pair of tweezers and grasp the wood tick close to the skin (on its head). Pull the wood tick straight upward without twisting or crushing it. Maintain a steady pressure until it releases its grip.   If tweezers aren't available, use fingers, a loop of thread around the jaws, or a needle between the jaws for traction.  Note: covering the tick with petroleum jelly, nail polish or rubbing alcohol doesn't work. Neither does touching the tick with a hot or cold object. Tiny Deer Tick Removal:   Needs to be scraped off with a knife blade or credit card edge. Place tick in a sealed container (e.g. glass jar, zip lock plastic bag), in case your doctor wants to see it. Tick's Head Removal:  If the wood tick's head breaks off in the skin, it must be removed. Clean the skin. Then use a sterile needle to uncover the head and lift it out or scrape it off.  If a very small piece of the head remains, the skin will eventually slough it off. Antibiotic Ointment:  Wash the wound and  your hands with soap and water after removal to prevent catching any tick disease.  Apply an over the counter antibiotic ointment (e.g. bacitracin) to the bite once. Expected Course: Tick bites normally don't itch or hurt. That's why they often go unnoticed. Call Your Doctor If:  You can't remove the tick or the tick's head Fever, a severe head ache, or rash occur in the next 2 weeks Bite begins to look infected Lyme's disease is common in your area You have not had a tetanus in the last 10 years Your current symptoms become worse    MAKE SURE  YOU  Understand these instructions. Will watch your condition. Will get help right away if you are not doing well or get worse.    Thank you for choosing an e-visit.  Your e-visit answers were reviewed by a board certified advanced clinical practitioner to complete your personal care plan. Depending upon the condition, your plan could have included both over the counter or prescription medications.  Please review your pharmacy choice. Make sure the pharmacy is open so you can pick up prescription now. If there is a problem, you may contact your provider through [employer] and have the prescription routed to another pharmacy.  Your safety is important to [place]. If you have drug allergies check your prescription carefully.   For the next 24 hours you can use MyChart to ask questions about today's visit, request a non-urgent call back, or ask for a work or school excuse. You will get an email in the next two days asking about your experience. I hope that your e-visit has been valuable and will speed your recovery.

## 2021-09-05 ENCOUNTER — Other Ambulatory Visit: Payer: Self-pay | Admitting: Internal Medicine

## 2021-09-05 ENCOUNTER — Other Ambulatory Visit (HOSPITAL_BASED_OUTPATIENT_CLINIC_OR_DEPARTMENT_OTHER): Payer: Self-pay

## 2021-09-05 DIAGNOSIS — G8929 Other chronic pain: Secondary | ICD-10-CM

## 2021-09-05 MED ORDER — TRAMADOL HCL 50 MG PO TABS
50.0000 mg | ORAL_TABLET | Freq: Two times a day (BID) | ORAL | 2 refills | Status: DC | PRN
  Filled 2021-09-05: qty 15, 8d supply, fill #0
  Filled 2021-09-22 – 2021-10-07 (×2): qty 15, 8d supply, fill #1
  Filled 2021-12-09: qty 15, 8d supply, fill #2

## 2021-09-22 ENCOUNTER — Other Ambulatory Visit: Payer: Self-pay | Admitting: Internal Medicine

## 2021-09-22 ENCOUNTER — Other Ambulatory Visit (HOSPITAL_BASED_OUTPATIENT_CLINIC_OR_DEPARTMENT_OTHER): Payer: Self-pay

## 2021-09-22 DIAGNOSIS — G43009 Migraine without aura, not intractable, without status migrainosus: Secondary | ICD-10-CM

## 2021-09-22 MED ORDER — NURTEC 75 MG PO TBDP
75.0000 mg | ORAL_TABLET | ORAL | 3 refills | Status: DC
  Filled 2021-09-22 – 2021-10-07 (×2): qty 16, 30d supply, fill #0
  Filled 2021-11-08: qty 16, 30d supply, fill #1
  Filled 2021-12-09: qty 16, 30d supply, fill #2
  Filled 2022-01-13: qty 16, 30d supply, fill #3

## 2021-10-06 ENCOUNTER — Other Ambulatory Visit (HOSPITAL_BASED_OUTPATIENT_CLINIC_OR_DEPARTMENT_OTHER): Payer: Self-pay

## 2021-10-07 ENCOUNTER — Other Ambulatory Visit (HOSPITAL_BASED_OUTPATIENT_CLINIC_OR_DEPARTMENT_OTHER): Payer: Self-pay

## 2021-10-18 ENCOUNTER — Ambulatory Visit (INDEPENDENT_AMBULATORY_CARE_PROVIDER_SITE_OTHER): Payer: 59 | Admitting: Psychiatry

## 2021-10-18 ENCOUNTER — Encounter: Payer: Self-pay | Admitting: Psychiatry

## 2021-10-18 VITALS — BP 128/82 | HR 90 | Temp 98.5°F | Wt 176.8 lb

## 2021-10-18 DIAGNOSIS — H524 Presbyopia: Secondary | ICD-10-CM | POA: Diagnosis not present

## 2021-10-18 DIAGNOSIS — H52223 Regular astigmatism, bilateral: Secondary | ICD-10-CM | POA: Diagnosis not present

## 2021-10-18 DIAGNOSIS — F419 Anxiety disorder, unspecified: Secondary | ICD-10-CM

## 2021-10-18 DIAGNOSIS — R41840 Attention and concentration deficit: Secondary | ICD-10-CM | POA: Diagnosis not present

## 2021-10-18 NOTE — Progress Notes (Unsigned)
Psychiatric Initial Adult Assessment   Patient Identification: Sarah Gallagher MRN:  [id number] Date of Evaluation:  10/18/2021 Referral Source: Dr.Rutwik Posey [place] Chief Complaint:   Chief Complaint  Patient presents with   Establish Care: 41 year old Caucasian female with history of anxiety, attention and concentration problems presented for psychiatric evaluation.   Visit Diagnosis:    ICD-10-CM   1. Anxiety disorder, unspecified type  F41.9     2. Attention and concentration deficit  R41.840       History of Present Illness:  Sarah Gallagher is a 41 year old Caucasian female, married, employed, has a history of migraine headaches, chronic hip pain, history of depression, anxiety, attention and focus deficit, Arnold-Chiari malformation, Mnire's disease, presented for a psychiatric evaluation.  Patient today reports she has been struggling with anxiety and depressive symptoms since the past several years.  She reports she struggled with postpartum depression after the birth of all 3 of her children who are currently 29, 69 and 66 years old.  Patient reports she has tried multiple psychotropic medications in the past including Lexapro, Prozac, BuSpar however all of these medications made her worse and hence she did not stay on them.  Patient also reports panic attacks or anxiety attacks in the past.  Patient reports when she has a lot of stresses and feels too overwhelmed she has had trouble with anxiety attacks.  Patient describes her anxiety attacks as chest pain, cramping of her hands, having shortness of breath and inability to do anything for the next 30 to 40 minutes or so.  Patient reports her OB/GYN prescribed her Xanax 0.25 mg and she takes half tablet of that once or twice a year and that helps her.  She only has panic attacks couple of times a year.  Has not had it in a while.  Patient denies any sleep problems.  Denies any problems with her appetite.  Does report emotional trauma,  relationship struggles with her spouse.  Patient reports history of having marital problems, infidelity issues of her spouse in the past.  Has tried EAP counseling in 2017 which helped to some extent.  Currently continues to live with her spouse, although does not believe her marriage is the greatest, reports they are trying to work on the relationship.  Reports she homeschools her children, has been doing so since the pandemic.  She works on the weekends, works as a Statistician.  She reports she does a lot of things by herself and gets overwhelmed on and off.  Her parents who live next door helps her out with taking care of her children when they need babysitting.  However currently she is also worried about her parents since her dad has medical problems and mom likely has early memory issues.  Patient reports she wonders whether she has ADHD, has noticed a lot of symptoms in herself.  She reports she struggles with attention, focus, concentration problems, has had it since seventh grade or so.  Has trouble focusing on things if they are boring, easily distracted, has trouble relaxing, has to keep everything organized and that is how she compensates for her attention deficit.  She reports she was recently started on phentermine by her primary provider for weight loss and she felt better when she started taking the medication.  Her primary care provider brought it to her attention to get tested for ADHD and that is why she is here today.  Patient denies any suicidality, homicidality or perceptual disturbances.  Patient denies  any other concerns today.   Associated Signs/Symptoms: Depression Symptoms:  difficulty concentrating, anxiety, (Hypo) Manic Symptoms:   Denies Anxiety Symptoms:  Panic Symptoms, Psychotic Symptoms:   Denies PTSD Symptoms: Had a traumatic exposure:  as noted above  Past Psychiatric History: Denies inpatient behavioral health admissions.  Does report a history of  anxiety and depression-diagnosed by her primary care provider in the past.  Does report a history of relationship struggles with her spouse-has been to EAP counseling in the past for a few sessions.  Denies suicide attempts.  Previous Psychotropic Medications: Yes tried multiple SSRIs-including Lexapro, Prozac, BuSpar-made her worse.  Also was prescribed Xanax in the past, used to take it rarely couple of times a year or so.  Substance Abuse History in the last 12 months:  No.  Consequences of Substance Abuse: Negative  Past Medical History:  Past Medical History:  Diagnosis Date   Arthritis    Chest discomfort    a. associated with palpitations.   Depression    Dyspnea on exertion    a. 12/2016 Echo: EF 55-60%, no rwma, nl PASP.   Juvenile rheumatoid arthritis (HCC)    Meniere disease    Migraines    Neuropathy    Palpitations    PVC's (premature ventricular contractions)    a. 01/2017 48h Holter: Rare PAC/PVC. Freq runs of sinus tachycardia.    Past Surgical History:  Procedure Laterality Date   KNEE SURGERY Right 1997, 1997   torn meniscus, ACL replacement     REFRACTIVE SURGERY     lasix    Family Psychiatric History: As noted below.  Family History:  Family History  Problem Relation Age of Onset   Diabetes Mother    Memory loss Mother    Arthritis Father    Other Father        atrial flutter   Emphysema Father        smoker   Diabetes Maternal Aunt    Stroke Paternal Aunt    Diabetes Maternal Uncle    Arthritis Maternal Grandmother    Diabetes Maternal Grandmother    Cancer Maternal Grandmother        skin   Arthritis Paternal Grandmother    Cancer Paternal Grandmother        skin    Social History:   Social History   Socioeconomic History   Marital status: Married    Spouse name: ray   Number of children: 3   Years of education: BS   Highest education level: Bachelor's degree (e.g., BA, AB, BS)  Occupational History    Comment: [employer], Resp Therapist  Tobacco Use   Smoking status: Never   Smokeless tobacco: Never  Vaping Use   Vaping Use: Never used  Substance and Sexual Activity   Alcohol use: Not Currently    Alcohol/week: 0.0 standard drinks of alcohol    Comment: rare   Drug use: No   Sexual activity: Yes    Birth control/protection: Injection  Other Topics Concern   Not on file  Social History Narrative   Lives in [place] with husband and children.  Not currently exercising.  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   Caffeine 2-3 cups daily   Social Determinants of Health   Financial Resource Strain: Not on file  Food Insecurity: Not on file  Transportation Needs: Not on file  Physical Activity: Not on file  Stress: Not on file  Social Connections: Not on file  Additional Social History: Patient is married.  She lives with her husband in [place].  She has 3 children aged - 57 year old son, 72 year old daughter, 68 year old son.  She has a bachelor's degree.  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.  She has 47 brother 45 years older than her.  Reports relationship as better now.  Patient does report relationship struggles which were emotionally traumatic to her with her spouse in the past.  Denies any legal problems.    Allergies:   Allergies  Allergen Reactions   Zithromax [Azithromycin] Other (See Comments)    "convulsions or tremors"    Metabolic Disorder Labs: Lab Results  Component Value Date   HGBA1C 5.8 (H) 05/23/2019   MPG 119.76 05/23/2019   MPG 108.28 10/18/2018   No results found for: "PROLACTIN" Lab Results  Component Value Date   CHOL 141 11/22/2020   TRIG 52 11/22/2020   HDL 39 (L) 11/22/2020   CHOLHDL 3.6 10/18/2018   VLDL 8 10/18/2018   LDLCALC 91 11/22/2020   LDLCALC 95 10/18/2018   Lab Results  Component Value Date   TSH 1.790 11/22/2020    Therapeutic Level Labs: No results found for: "LITHIUM" No results found for: "CBMZ" No results found for:  "VALPROATE"  Current Medications: Current Outpatient Medications  Medication Sig Dispense Refill   aspirin-acetaminophen-caffeine (EXCEDRIN MIGRAINE) 250-250-65 MG tablet Take by mouth daily.     b complex vitamins capsule Take 1 capsule by mouth daily. Takes a sublingual form     medroxyPROGESTERone Acetate 150 MG/ML SUSY INJECT 1 ML INTO THE MUSCLE EVERY 3 MONTHS. 1 mL 3   phentermine 37.5 MG capsule Take 1 capsule (37.5 mg total) by mouth every morning. 30 capsule 2   Rimegepant Sulfate (NURTEC) 75 MG TBDP Take 75 mg by mouth every other day. 16 tablet 3   traMADol (ULTRAM) 50 MG tablet Take 1 tablet (50 mg total) by mouth every 12 (twelve) hours as needed for moderate pain or severe pain. 15 tablet 2   No current facility-administered medications for this visit.    Musculoskeletal: Strength & Muscle Tone: within normal limits Gait & Station: normal Patient leans: N/A  Psychiatric Specialty Exam: Review of Systems  Psychiatric/Behavioral:  Positive for decreased concentration. The patient is nervous/anxious.   All other systems reviewed and are negative.   Blood pressure 128/82, pulse 90, temperature 98.5 F (36.9 C), temperature source Temporal, weight 176 lb 12.8 oz (80.2 kg).Body mass index is 29.42 kg/m.  General Appearance: Casual  Eye Contact:  Fair  Speech:  Clear and Coherent  Volume:  Normal  Mood:  Anxious  Affect:  Congruent and Tearful  Thought Process:  Goal Directed and Descriptions of Associations: Circumstantial  Orientation:  Full (Time, Place, and Person)  Thought Content:  Logical  Suicidal Thoughts:  No  Homicidal Thoughts:  No  Memory:  Immediate;   Fair Recent;   Fair Remote;   Fair  Judgement:  Fair  Insight:  Fair  Psychomotor Activity:  Normal  Concentration:  Concentration: Fair and Attention Span: Fair  Recall:  [place]: Fair  Akathisia:  No  Handed:  Right  AIMS (if indicated):  not done  Assets:   Communication Skills Desire for Improvement Housing Social Support Transportation Vocational/Educational  ADL's:  Intact  Cognition: WNL  Sleep:  Fair   Screenings: GAD-7    Personnel officer Visit from 10/18/2021 in [place] Office Visit from  05/21/2020 in [facility]  Total GAD-7 Score 7 1      PHQ2-9    [place] Visit from 10/18/2021 in [place] Office Visit from 07/18/2021 in [facility] Office Visit from 04/11/2021 in [facility] Office Visit from 01/03/2021 in [facility] Office Visit from 12/02/2020 in [facility]  PHQ-2 Total Score 0 0 0 0 0      [place] Office Visit from 10/18/2021 in [place] No Risk       Assessment and Plan: SHIRLA HODGKISS is a 41 year old Caucasian female who has a history of depression, anxiety, Mnire's disease, Arnold-Chiari malformation, migraine headaches, attention and concentration problems was evaluated in office today.  Patient with relationship struggles with spouse, multiple psychosocial stressors is interested in treatment for attention and focus deficit, believes she has ADHD.  Patient however is not interested in antidepressants at this time, agreeable to referral for ADHD testing-neuropsychological testing.  Plan as noted below. The patient demonstrates the following risk factors for suicide: Chronic risk factors for suicide include: psychiatric disorder of anxiety, depression . Acute risk factors for suicide include: family or marital conflict. Protective factors for this patient include: positive social support, responsibility to others (children, family), and coping skills. Considering these factors, the overall suicide risk at this point appears to be low. Patient is appropriate for outpatient follow up.  Plan  Anxiety disorder  unspecified-improving Patient is not interested in addition of an SSRI/SNRI. Discussed referral for CBT.  Patient is not interested at this time.  Attention and focus deficit-unstable Will refer for neuropsychological testing. Discussed addition of Wellbutrin however patient is not interested.  Reviewed labs-TSH-dated 11/22/2020-within normal limits.  Reviewed notes per primary provider-Dr. Patel-dated 07/18/2021-patient on phentermine-with good response with regards to her functioning and concentration, referred to psychiatry for diagnostic clarification.  Follow-up in clinic after neuropsychological testing.   This note was generated in part or whole with voice recognition software. Voice recognition is usually quite accurate but there are transcription errors that can and very often do occur. I apologize for any typographical errors that were not detected and corrected.    Ursula Alert, MD 7/19/20238:24 AM

## 2021-10-19 ENCOUNTER — Encounter: Payer: Self-pay | Admitting: Psychology

## 2021-11-08 ENCOUNTER — Other Ambulatory Visit: Payer: Self-pay | Admitting: Nurse Practitioner

## 2021-11-08 ENCOUNTER — Other Ambulatory Visit (HOSPITAL_BASED_OUTPATIENT_CLINIC_OR_DEPARTMENT_OTHER): Payer: Self-pay

## 2021-11-08 DIAGNOSIS — Z3009 Encounter for other general counseling and advice on contraception: Secondary | ICD-10-CM

## 2021-11-08 DIAGNOSIS — G43009 Migraine without aura, not intractable, without status migrainosus: Secondary | ICD-10-CM

## 2021-11-09 ENCOUNTER — Other Ambulatory Visit (HOSPITAL_BASED_OUTPATIENT_CLINIC_OR_DEPARTMENT_OTHER): Payer: Self-pay

## 2021-11-09 ENCOUNTER — Other Ambulatory Visit: Payer: Self-pay | Admitting: Internal Medicine

## 2021-11-09 DIAGNOSIS — Z3009 Encounter for other general counseling and advice on contraception: Secondary | ICD-10-CM

## 2021-11-09 DIAGNOSIS — G43009 Migraine without aura, not intractable, without status migrainosus: Secondary | ICD-10-CM

## 2021-11-09 DIAGNOSIS — Z3042 Encounter for surveillance of injectable contraceptive: Secondary | ICD-10-CM

## 2021-11-09 MED ORDER — MEDROXYPROGESTERONE ACETATE 150 MG/ML IM SUSY
PREFILLED_SYRINGE | INTRAMUSCULAR | 3 refills | Status: DC
  Filled 2021-11-09 – 2021-12-09 (×2): qty 1, 90d supply, fill #0
  Filled 2022-03-03: qty 1, 90d supply, fill #1
  Filled 2022-06-14: qty 1, 90d supply, fill #2
  Filled 2022-09-11: qty 1, 90d supply, fill #3

## 2021-11-23 DIAGNOSIS — Z0000 Encounter for general adult medical examination without abnormal findings: Secondary | ICD-10-CM | POA: Diagnosis not present

## 2021-11-24 ENCOUNTER — Other Ambulatory Visit (HOSPITAL_BASED_OUTPATIENT_CLINIC_OR_DEPARTMENT_OTHER): Payer: Self-pay

## 2021-11-24 LAB — CBC WITH DIFFERENTIAL/PLATELET
Basophils Absolute: 0 10*3/uL (ref 0.0–0.2)
Basos: 1 %
EOS (ABSOLUTE): 0.1 10*3/uL (ref 0.0–0.4)
Eos: 2 %
Hematocrit: 39.8 % (ref 34.0–46.6)
Hemoglobin: 13 g/dL (ref 11.1–15.9)
Immature Grans (Abs): 0 10*3/uL (ref 0.0–0.1)
Immature Granulocytes: 0 %
Lymphocytes Absolute: 1.8 10*3/uL (ref 0.7–3.1)
Lymphs: 29 %
MCH: 30.2 pg (ref 26.6–33.0)
MCHC: 32.7 g/dL (ref 31.5–35.7)
MCV: 92 fL (ref 79–97)
Monocytes Absolute: 0.5 10*3/uL (ref 0.1–0.9)
Monocytes: 8 %
Neutrophils Absolute: 3.7 10*3/uL (ref 1.4–7.0)
Neutrophils: 60 %
Platelets: 184 10*3/uL (ref 150–450)
RBC: 4.31 x10E6/uL (ref 3.77–5.28)
RDW: 13.2 % (ref 11.7–15.4)
WBC: 6.2 10*3/uL (ref 3.4–10.8)

## 2021-11-24 LAB — LIPID PANEL
Chol/HDL Ratio: 3.3 ratio (ref 0.0–4.4)
Cholesterol, Total: 125 mg/dL (ref 100–199)
HDL: 38 mg/dL — ABNORMAL LOW (ref >39)
LDL Chol Calc (NIH): 77 mg/dL (ref 0–99)
Triglycerides: 42 mg/dL (ref 0–149)
VLDL Cholesterol Cal: 10 mg/dL (ref 5–40)

## 2021-11-24 LAB — CMP14+EGFR
ALT: 15 IU/L (ref 0–32)
AST: 17 IU/L (ref 0–40)
Albumin/Globulin Ratio: 2 (ref 1.2–2.2)
Albumin: 4.3 g/dL (ref 3.9–4.9)
Alkaline Phosphatase: 68 IU/L (ref 44–121)
BUN/Creatinine Ratio: 16 (ref 9–23)
BUN: 15 mg/dL (ref 6–24)
Bilirubin Total: 0.7 mg/dL (ref 0.0–1.2)
CO2: 21 mmol/L (ref 20–29)
Calcium: 9.4 mg/dL (ref 8.7–10.2)
Chloride: 106 mmol/L (ref 96–106)
Creatinine, Ser: 0.95 mg/dL (ref 0.57–1.00)
Globulin, Total: 2.2 g/dL (ref 1.5–4.5)
Glucose: 84 mg/dL (ref 70–99)
Potassium: 4.4 mmol/L (ref 3.5–5.2)
Sodium: 143 mmol/L (ref 134–144)
Total Protein: 6.5 g/dL (ref 6.0–8.5)
eGFR: 77 mL/min/{1.73_m2} (ref >59)

## 2021-11-24 LAB — VITAMIN D 25 HYDROXY (VIT D DEFICIENCY, FRACTURES): Vit D, 25-Hydroxy: 32.4 ng/mL (ref 30.0–100.0)

## 2021-11-24 LAB — TSH: TSH: 1.99 u[IU]/mL (ref 0.450–4.500)

## 2021-11-24 LAB — HEMOGLOBIN A1C
Est. average glucose Bld gHb Est-mCnc: 114 mg/dL
Hgb A1c MFr Bld: 5.6 % (ref 4.8–5.6)

## 2021-11-29 ENCOUNTER — Ambulatory Visit (INDEPENDENT_AMBULATORY_CARE_PROVIDER_SITE_OTHER): Payer: 59 | Admitting: Internal Medicine

## 2021-11-29 ENCOUNTER — Encounter: Payer: Self-pay | Admitting: Internal Medicine

## 2021-11-29 VITALS — BP 102/70 | HR 69 | Resp 18 | Ht 66.0 in | Wt 180.4 lb

## 2021-11-29 DIAGNOSIS — F411 Generalized anxiety disorder: Secondary | ICD-10-CM

## 2021-11-29 DIAGNOSIS — G43009 Migraine without aura, not intractable, without status migrainosus: Secondary | ICD-10-CM

## 2021-11-29 DIAGNOSIS — Z0001 Encounter for general adult medical examination with abnormal findings: Secondary | ICD-10-CM

## 2021-11-29 DIAGNOSIS — E663 Overweight: Secondary | ICD-10-CM | POA: Diagnosis not present

## 2021-11-29 NOTE — Progress Notes (Unsigned)
Established Patient Office Visit  Subjective:  Patient ID: Sarah Gallagher, female    DOB: Jun 16, 1980  Age: 41 y.o. MRN: [id number]  CC:  Chief Complaint  Patient presents with   Annual Exam    Annual exam     HPI Sarah Gallagher is a 41 y.o. female with past medical history of migraine, chronic hip pain and depression with anxiety who presents for annual physical.  She has stopped taking phentermine as she was having intermittent palpitations with it.  She had lost weight since the last visit, but has gained it back since stopping phentermine about a month ago.  She is dealing with financial stress at home currently.  She has seen psychiatrist for evaluation of anxiety and possible ADD.  She was referred for neuropsych evaluation.     Past Medical History:  Diagnosis Date   Arthritis    Chest discomfort    a. associated with palpitations.   Depression    Dyspnea on exertion    a. 12/2016 Echo: EF 55-60%, no rwma, nl PASP.   Juvenile rheumatoid arthritis (HCC)    Meniere disease    Migraines    Neuropathy    Palpitations    PVC's (premature ventricular contractions)    a. 01/2017 48h Holter: Rare PAC/PVC. Freq runs of sinus tachycardia.    Past Surgical History:  Procedure Laterality Date   KNEE SURGERY Right 1997, 1997   torn meniscus, ACL replacement     REFRACTIVE SURGERY     lasix    Family History  Problem Relation Age of Onset   Diabetes Mother    Memory loss Mother    Arthritis Father    Other Father        atrial flutter   Emphysema Father        smoker   Diabetes Maternal Aunt    Stroke Paternal Aunt    Diabetes Maternal Uncle    Arthritis Maternal Grandmother    Diabetes Maternal Grandmother    Cancer Maternal Grandmother        skin   Arthritis Paternal Grandmother    Cancer Paternal Grandmother        skin    Social History   Socioeconomic History   Marital status: Married    Spouse name: ray   Number of children: 3   Years of  education: BS   Highest education level: Bachelor's degree (e.g., BA, AB, BS)  Occupational History    Comment: [employer], Resp Therapist  Tobacco Use   Smoking status: Never   Smokeless tobacco: Never  Vaping Use   Vaping Use: Never used  Substance and Sexual Activity   Alcohol use: Not Currently    Alcohol/week: 0.0 standard drinks of alcohol    Comment: rare   Drug use: No   Sexual activity: Yes    Birth control/protection: Injection  Other Topics Concern   Not on file  Social History Narrative   Lives in [place] with husband and children.  Not currently exercising.  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   Caffeine 2-3 cups daily   Social Determinants of Health   Financial Resource Strain: Not on file  Food Insecurity: Not on file  Transportation Needs: Not on file  Physical Activity: Not on file  Stress: Not on file  Social Connections: Not on file  Intimate Partner Violence: Not on file    Outpatient Medications Prior to Visit  Medication Sig Dispense Refill   aspirin-acetaminophen-caffeine (  EXCEDRIN MIGRAINE) 250-250-65 MG tablet Take by mouth daily.     clonazePAM (KLONOPIN) 0.5 MG tablet Take 0.5 mg by mouth 2 (two) times daily as needed.     medroxyPROGESTERone Acetate 150 MG/ML SUSY INJECT 1 ML INTO THE MUSCLE EVERY 3 MONTHS. 1 mL 3   Rimegepant Sulfate (NURTEC) 75 MG TBDP Take 75 mg by mouth every other day. 16 tablet 3   traMADol (ULTRAM) 50 MG tablet Take 1 tablet (50 mg total) by mouth every 12 (twelve) hours as needed for moderate pain or severe pain. 15 tablet 2   b complex vitamins capsule Take 1 capsule by mouth daily. Takes a sublingual form (Patient not taking: Reported on 11/29/2021)     phentermine 37.5 MG capsule Take 1 capsule (37.5 mg total) by mouth every morning. (Patient not taking: Reported on 11/29/2021) 30 capsule 2   No facility-administered medications prior to visit.    Allergies  Allergen Reactions   Zithromax [Azithromycin] Other (See  Comments)    "convulsions or tremors"    ROS Review of Systems  Constitutional:  Negative for chills and fever.  HENT:  Negative for congestion, sinus pressure, sinus pain and sore throat.   Eyes:  Negative for pain and discharge.  Respiratory:  Negative for cough and shortness of breath.   Cardiovascular:  Negative for chest pain and palpitations.  Gastrointestinal:  Negative for abdominal pain, diarrhea, nausea and vomiting.  Endocrine: Negative for polydipsia and polyuria.  Genitourinary:  Negative for dysuria and hematuria.  Musculoskeletal:  Positive for arthralgias. Negative for neck pain and neck stiffness.  Skin:  Negative for rash.  Neurological:  Positive for headaches. Negative for dizziness and weakness.  Psychiatric/Behavioral:  Negative for agitation and behavioral problems. The patient is nervous/anxious. The patient is not hyperactive.       Objective:    Physical Exam Vitals reviewed.  Constitutional:      General: She is not in acute distress.    Appearance: She is not diaphoretic.  HENT:     Head: Normocephalic and atraumatic.     Nose: Nose normal. No congestion.     Mouth/Throat:     Mouth: Mucous membranes are moist.     Pharynx: No posterior oropharyngeal erythema.  Eyes:     General: No scleral icterus.    Extraocular Movements: Extraocular movements intact.  Cardiovascular:     Rate and Rhythm: Normal rate and regular rhythm.     Pulses: Normal pulses.     Heart sounds: Normal heart sounds. No murmur heard. Pulmonary:     Breath sounds: Normal breath sounds. No wheezing or rales.  Abdominal:     Palpations: Abdomen is soft.     Tenderness: There is no abdominal tenderness.  Musculoskeletal:     Cervical back: Neck supple. No tenderness.     Right lower leg: No edema.     Left lower leg: No edema.  Skin:    General: Skin is warm.     Findings: No rash.  Neurological:     General: No focal deficit present.     Mental Status: She is alert  and oriented to person, place, and time.     Sensory: No sensory deficit.     Motor: No weakness.  Psychiatric:        Mood and Affect: Mood normal.        Behavior: Behavior normal.     BP 102/70 (BP Location: Right Arm, Patient Position: Sitting, Cuff Size: Normal)  Pulse 69   Resp 18   Ht _0  (1.676 m)   Wt 180 lb 6.4 oz (81.8 kg)   SpO2 97%   BMI 29.12 kg/m  Wt Readings from Last 3 Encounters:  11/29/21 180 lb 6.4 oz (81.8 kg)  07/18/21 178 lb 9.6 oz (81 kg)  04/11/21 180 lb 1.3 oz (81.7 kg)    Lab Results  Component Value Date   TSH 1.990 11/23/2021   Lab Results  Component Value Date   WBC 6.2 11/23/2021   HGB 13.0 11/23/2021   HCT 39.8 11/23/2021   MCV 92 11/23/2021   PLT 184 11/23/2021   Lab Results  Component Value Date   NA 143 11/23/2021   K 4.4 11/23/2021   CO2 21 11/23/2021   GLUCOSE 84 11/23/2021   BUN 15 11/23/2021   CREATININE 0.95 11/23/2021   BILITOT 0.7 11/23/2021   ALKPHOS 68 11/23/2021   AST 17 11/23/2021   ALT 15 11/23/2021   PROT 6.5 11/23/2021   ALBUMIN 4.3 11/23/2021   CALCIUM 9.4 11/23/2021   ANIONGAP 11 05/23/2019   EGFR 77 11/23/2021   GFR 62.94 07/12/2017   Lab Results  Component Value Date   CHOL 125 11/23/2021   Lab Results  Component Value Date   HDL 38 (L) 11/23/2021   Lab Results  Component Value Date   LDLCALC 77 11/23/2021   Lab Results  Component Value Date   TRIG 42 11/23/2021   Lab Results  Component Value Date   CHOLHDL 3.3 11/23/2021   Lab Results  Component Value Date   HGBA1C 5.6 11/23/2021      Assessment & Plan:   Problem List Items Addressed This Visit       Cardiovascular and Mediastinum   Migraine    Well controlled with Nurtec as needed      Relevant Medications   clonazePAM (KLONOPIN) 0.5 MG tablet     Other   Anxiety disorder    She had concern for ADHD Referred to psychiatry - awaiting neuropsych evaluation She has not tolerated multiple antidepressants in the past,  including Lexapro, Effexor, BuSpar etc. She prefers to continue relaxation techniques at home for now.      Overweight    Was on Phentermine, but had to stop due to palpitations Has lost about 14 lbs since starting in 11/2020 Advised to continue to follow low-carb diet      Encounter for general adult medical examination with abnormal findings - Primary    Physical exam as documented. Fasting blood tests reviewed and discussed with the patient.       No orders of the defined types were placed in this encounter.   Follow-up: Return in about 6 months (around 06/01/2022).    Lindell Spar, MD

## 2021-11-29 NOTE — Patient Instructions (Signed)
Please continue taking medications as prescribed.  Please continue to follow low carb diet and perform moderate exercise/walking at least 150 mins/week. 

## 2021-11-30 DIAGNOSIS — Z0001 Encounter for general adult medical examination with abnormal findings: Secondary | ICD-10-CM | POA: Insufficient documentation

## 2021-11-30 NOTE — Assessment & Plan Note (Addendum)
Physical exam as documented. ?Fasting blood tests reviewed and discussed with the patient. ?

## 2021-11-30 NOTE — Assessment & Plan Note (Signed)
Was on Phentermine, but had to stop due to palpitations Has lost about 14 lbs since starting in 11/2020 Advised to continue to follow low-carb diet

## 2021-11-30 NOTE — Assessment & Plan Note (Signed)
Well controlled with Nurtec as needed 

## 2021-11-30 NOTE — Assessment & Plan Note (Signed)
She had concern for ADHD Referred to psychiatry - awaiting neuropsych evaluation She has not tolerated multiple antidepressants in the past, including Lexapro, Effexor, BuSpar etc. She prefers to continue relaxation techniques at home for now.

## 2021-12-09 ENCOUNTER — Other Ambulatory Visit (HOSPITAL_BASED_OUTPATIENT_CLINIC_OR_DEPARTMENT_OTHER): Payer: Self-pay

## 2022-01-13 ENCOUNTER — Other Ambulatory Visit (HOSPITAL_BASED_OUTPATIENT_CLINIC_OR_DEPARTMENT_OTHER): Payer: Self-pay

## 2022-01-13 ENCOUNTER — Other Ambulatory Visit: Payer: Self-pay | Admitting: Internal Medicine

## 2022-01-13 DIAGNOSIS — G8929 Other chronic pain: Secondary | ICD-10-CM

## 2022-01-13 MED ORDER — INFLUENZA VAC SPLIT QUAD 0.5 ML IM SUSY
PREFILLED_SYRINGE | INTRAMUSCULAR | 0 refills | Status: DC
  Filled 2022-01-13: qty 0.5, 1d supply, fill #0

## 2022-01-16 ENCOUNTER — Other Ambulatory Visit (HOSPITAL_BASED_OUTPATIENT_CLINIC_OR_DEPARTMENT_OTHER): Payer: Self-pay

## 2022-01-16 MED ORDER — TRAMADOL HCL 50 MG PO TABS
50.0000 mg | ORAL_TABLET | Freq: Two times a day (BID) | ORAL | 2 refills | Status: DC | PRN
  Filled 2022-01-16: qty 15, 8d supply, fill #0
  Filled 2022-03-03: qty 15, 8d supply, fill #1
  Filled 2022-05-05: qty 15, 8d supply, fill #2

## 2022-02-02 ENCOUNTER — Ambulatory Visit (INDEPENDENT_AMBULATORY_CARE_PROVIDER_SITE_OTHER): Payer: 59 | Admitting: Orthopaedic Surgery

## 2022-02-02 ENCOUNTER — Ambulatory Visit (INDEPENDENT_AMBULATORY_CARE_PROVIDER_SITE_OTHER): Payer: 59

## 2022-02-02 DIAGNOSIS — M67951 Unspecified disorder of synovium and tendon, right thigh: Secondary | ICD-10-CM

## 2022-02-02 DIAGNOSIS — M25561 Pain in right knee: Secondary | ICD-10-CM

## 2022-02-02 MED ORDER — TRIAMCINOLONE ACETONIDE 40 MG/ML IJ SUSP
80.0000 mg | INTRAMUSCULAR | Status: AC | PRN
  Administered 2022-02-02: 80 mg via INTRA_ARTICULAR

## 2022-02-02 MED ORDER — LIDOCAINE HCL 1 % IJ SOLN
4.0000 mL | INTRAMUSCULAR | Status: AC | PRN
  Administered 2022-02-02: 4 mL

## 2022-02-02 NOTE — Progress Notes (Signed)
Chief Complaint: Knee, left hip pain     History of Present Illness:    Sarah Gallagher is a 41 y.o. female presents here for right knee pain as well as left hip pain.  With regard to the right knee she has had several days of essentially atraumatic right knee pain although she did have a minor slip on the right knee.  Denies any feeling of instability or giving out of the right knee.  She states that she did have an ACL reconstruction done in high school with allograft.  She does have an incision over the patella tendon however.  She did also have a lateral meniscal repair done several months prior to her ACL reconstruction.  States that she overall has not had pain following this until recently.  Pain focally lateral isolated pain.  With regard to the left hip she has not had left hip pain for many years.  She has done physical therapy 3 different times and extensively.  She has had 2-3 different trochanteric injections and PRP was also recommended although at this time she continues to have persistent pain about the hip without any resolution of her symptoms.  She got several weeks of relief from injections but nothing like this.  She works as a Arts administrator at [employer].    Surgical History:   As above  PMH/PSH/Family History/Social History/Meds/Allergies:    Past Medical History:  Diagnosis Date  . Arthritis   . Chest discomfort    a. associated with palpitations.  . Depression   . Dyspnea on exertion    a. 12/2016 Echo: EF 55-60%, no rwma, nl PASP.  Mar[facility] Kitchen Juvenile rheumatoid arthritis ([place])   . Meniere disease   . Migraines   . Neuropathy   . Palpitations   . PVC's (premature ventricular contractions)    a. 01/2017 48h Holter: Rare PAC/PVC. Freq runs of sinus tachycardia.   Past Surgical History:  Procedure Laterality Date  . KNEE SURGERY Right 1997, 1997   torn meniscus, ACL replacement    . REFRACTIVE SURGERY     lasix    Social History   Socioeconomic History  . Marital status: Married    Spouse name: ray  . Number of children: 3  . Years of education: BS  . Highest education level: Bachelor's degree (e.g., BA, AB, BS)  Occupational History    Comment: [employer], Resp Therapist  Tobacco Use  . Smoking status: Never  . Smokeless tobacco: Never  Vaping Use  . Vaping Use: Never used  Substance and Sexual Activity  . Alcohol use: Not Currently    Alcohol/week: 0.0 standard drinks of alcohol    Comment: rare  . Drug use: No  . Sexual activity: Yes    Birth control/protection: Injection  Other Topics Concern  . Not on file  Social History Narrative   Lives in [place] with husband and children.  Not currently exercising.  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   Caffeine 2-3 cups daily   Social Determinants of Health   Financial Resource Strain: Not on file  Food Insecurity: Not on file  Transportation Needs: Not on file  Physical Activity: Not on file  Stress: Not on file  Social Connections: Not on file   Family History  Problem Relation Age of  Onset  . Diabetes Mother   . Memory loss Mother   . Arthritis Father   . Other Father        atrial flutter  . Emphysema Father        smoker  . Diabetes Maternal Aunt   . Stroke Paternal Aunt   . Diabetes Maternal Uncle   . Arthritis Maternal Grandmother   . Diabetes Maternal Grandmother   . Cancer Maternal Grandmother        skin  . Arthritis Paternal Grandmother   . Cancer Paternal Grandmother        skin   Allergies  Allergen Reactions  . Zithromax [Azithromycin] Other (See Comments)    "convulsions or tremors"   Current Outpatient Medications  Medication Sig Dispense Refill  . aspirin-acetaminophen-caffeine (EXCEDRIN MIGRAINE) 250-250-65 MG tablet Take by mouth daily.    . clonazePAM (KLONOPIN) 0.5 MG tablet Take 0.5 mg by mouth 2 (two) times daily as needed.    . influenza vac split quadrivalent PF (FLUARIX) 0.5 ML injection  Inject into the muscle. 0.5 mL 0  . medroxyPROGESTERone Acetate 150 MG/ML SUSY INJECT 1 ML INTO THE MUSCLE EVERY 3 MONTHS. 1 mL 3  . Rimegepant Sulfate (NURTEC) 75 MG TBDP Take 75 mg by mouth every other day. 16 tablet 3  . traMADol (ULTRAM) 50 MG tablet Take 1 tablet (50 mg total) by mouth every 12 (twelve) hours as needed for moderate pain or severe pain. 15 tablet 2   No current facility-administered medications for this visit.   No results found.  Review of Systems:   A ROS was performed including pertinent positives and negatives as documented in the HPI.  Physical Exam :   Constitutional: NAD and appears stated age Neurological: Alert and oriented Psych: Appropriate affect and cooperative There were no vitals taken for this visit.   Comprehensive Musculoskeletal Exam:    Tenderness palpation about lateral trochanter on the left.  Range of motion is 30 degrees internal/external rotation of the left hip with 100 degrees of left hip flexion with minimal pain.  There is positive Trendelenburg gait with walking.  Weakness with resisted hip abduction on the left.  With regard to the right knee she has a negative Lachman.  There is lateral joint line tenderness and swelling.  No varus or valgus laxity.  Range of motion is from 0 to 130 degrees.  Previous incisions are well-healed Imaging:   Xray (4 view right knee): There is calcification involving the lateral meniscus consistent with possible chondrocalcinosis    I personally reviewed and interpreted the radiographs.   Assessment:   41 y.o. female with right lateral meniscal chondrocalcinosis status post distant history of ACL reconstruction.  Side effect I recommended ultrasound-guided injection of the right knee to hopefully completely resolve her symptoms.  I did describe that this is consistent with her atraumatic mechanism.  Regard to the left hip I do believe that she likely has evidence of underlying gluteus medius  tendinopathy or tearing.  This time she has failed at least 3 injections with 3 different bouts of physical therapy none of which have given her relief.  To that effect I do believe an MRI is indicated in order to rule out any type of underlying gluteus tearing  Plan :    -Plan for MRI left hip and follow-up to discuss results    Procedure Note  Patient: Sarah Gallagher             Date  of Birth: January 29, 1981           MRN: [id number]             Visit Date: 02/02/2022  Procedures: Visit Diagnoses:  1. Acute pain of right knee   2. Tendinopathy of right gluteus medius     Large Joint Inj: R knee on 02/02/2022 11:46 AM Indications: pain Details: 22 G 1.5 in needle, ultrasound-guided anterior approach  Arthrogram: No  Medications: 4 mL lidocaine 1 %; 80 mg triamcinolone acetonide 40 MG/ML Outcome: tolerated well, no immediate complications Procedure, treatment alternatives, risks and benefits explained, specific risks discussed. Consent was given by the patient. Immediately prior to procedure a time out was called to verify the correct patient, procedure, equipment, support staff and site/side marked as required. Patient was prepped and draped in the usual sterile fashion.          I personally saw and evaluated the patient, and participated in the management and treatment plan.  Vanetta Mulders, MD Attending Physician, Orthopedic Surgery  This document was dictated using Dragon voice recognition software. A reasonable attempt at proof reading has been made to minimize errors.

## 2022-02-15 ENCOUNTER — Ambulatory Visit
Admission: RE | Admit: 2022-02-15 | Discharge: 2022-02-15 | Disposition: A | Discharge status: Home / Self Care | Payer: 59 | Source: Ambulatory Visit | Attending: Orthopaedic Surgery | Admitting: Orthopaedic Surgery

## 2022-02-15 DIAGNOSIS — R600 Localized edema: Secondary | ICD-10-CM | POA: Diagnosis not present

## 2022-02-15 DIAGNOSIS — M67951 Unspecified disorder of synovium and tendon, right thigh: Secondary | ICD-10-CM

## 2022-03-01 ENCOUNTER — Ambulatory Visit (HOSPITAL_BASED_OUTPATIENT_CLINIC_OR_DEPARTMENT_OTHER): Payer: 59 | Admitting: Orthopaedic Surgery

## 2022-03-02 ENCOUNTER — Ambulatory Visit (INDEPENDENT_AMBULATORY_CARE_PROVIDER_SITE_OTHER): Payer: 59 | Admitting: Orthopaedic Surgery

## 2022-03-02 DIAGNOSIS — M67952 Unspecified disorder of synovium and tendon, left thigh: Secondary | ICD-10-CM | POA: Diagnosis not present

## 2022-03-02 DIAGNOSIS — M67959 Unspecified disorder of synovium and tendon, unspecified thigh: Secondary | ICD-10-CM

## 2022-03-02 MED ORDER — LIDOCAINE HCL 1 % IJ SOLN
4.0000 mL | INTRAMUSCULAR | Status: AC | PRN
  Administered 2022-03-02: 4 mL

## 2022-03-02 MED ORDER — TRIAMCINOLONE ACETONIDE 40 MG/ML IJ SUSP
80.0000 mg | INTRAMUSCULAR | Status: AC | PRN
  Administered 2022-03-02: 80 mg via INTRA_ARTICULAR

## 2022-03-02 NOTE — Progress Notes (Signed)
Chief Complaint: Knee, left hip pain     History of Present Illness:   03/02/2022: Presents today for follow-up of the left hip.  Overall the knee is feeling dramatically better.  That being said her hip continues to have spasming pain going up and down the gluteal region.  There is weakness with positive Trendelenburg.  She is hoping to pursue an injection of the left hip today.  Sarah Gallagher is a 41 y.o. female presents here for right knee pain as well as left hip pain.  With regard to the right knee she has had several days of essentially atraumatic right knee pain although she did have a minor slip on the right knee.  Denies any feeling of instability or giving out of the right knee.  She states that she did have an ACL reconstruction done in high school with allograft.  She does have an incision over the patella tendon however.  She did also have a lateral meniscal repair done several months prior to her ACL reconstruction.  States that she overall has not had pain following this until recently.  Pain focally lateral isolated pain.  With regard to the left hip she has not had left hip pain for many years.  She has done physical therapy 3 different times and extensively.  She has had 2-3 different trochanteric injections and PRP was also recommended although at this time she continues to have persistent pain about the hip without any resolution of her symptoms.  She got several weeks of relief from injections but nothing like this.  She works as a Arts administrator at [employer].    Surgical History:   As above  PMH/PSH/Family History/Social History/Meds/Allergies:    Past Medical History:  Diagnosis Date   Arthritis    Chest discomfort    a. associated with palpitations.   Depression    Dyspnea on exertion    a. 12/2016 Echo: EF 55-60%, no rwma, nl PASP.   Juvenile rheumatoid arthritis (HCC)    Meniere disease    Migraines     Neuropathy    Palpitations    PVC's (premature ventricular contractions)    a. 01/2017 48h Holter: Rare PAC/PVC. Freq runs of sinus tachycardia.   Past Surgical History:  Procedure Laterality Date   KNEE SURGERY Right 1997, 1997   torn meniscus, ACL replacement     REFRACTIVE SURGERY     lasix   Social History   Socioeconomic History   Marital status: Married    Spouse name: ray   Number of children: 3   Years of education: BS   Highest education level: Bachelor's degree (e.g., BA, AB, BS)  Occupational History    Comment: [employer], Resp Therapist  Tobacco Use   Smoking status: Never   Smokeless tobacco: Never  Vaping Use   Vaping Use: Never used  Substance and Sexual Activity   Alcohol use: Not Currently    Alcohol/week: 0.0 standard drinks of alcohol    Comment: rare   Drug use: No   Sexual activity: Yes    Birth control/protection: Injection  Other Topics Concern   Not on file  Social History Narrative   Lives in [place] with husband and children.  Not currently exercising.  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   Caffeine  2-3 cups daily   Social Determinants of Health   Financial Resource Strain: Not on file  Food Insecurity: Not on file  Transportation Needs: Not on file  Physical Activity: Not on file  Stress: Not on file  Social Connections: Not on file   Family History  Problem Relation Age of Onset   Diabetes Mother    Memory loss Mother    Arthritis Father    Other Father        atrial flutter   Emphysema Father        smoker   Diabetes Maternal Aunt    Stroke Paternal Aunt    Diabetes Maternal Uncle    Arthritis Maternal Grandmother    Diabetes Maternal Grandmother    Cancer Maternal Grandmother        skin   Arthritis Paternal Grandmother    Cancer Paternal Grandmother        skin   Allergies  Allergen Reactions   Zithromax [Azithromycin] Other (See Comments)    "convulsions or tremors"   Current Outpatient Medications  Medication  Sig Dispense Refill   aspirin-acetaminophen-caffeine (EXCEDRIN MIGRAINE) 250-250-65 MG tablet Take by mouth daily.     clonazePAM (KLONOPIN) 0.5 MG tablet Take 0.5 mg by mouth 2 (two) times daily as needed.     influenza vac split quadrivalent PF (FLUARIX) 0.5 ML injection Inject into the muscle. 0.5 mL 0   medroxyPROGESTERone Acetate 150 MG/ML SUSY INJECT 1 ML INTO THE MUSCLE EVERY 3 MONTHS. 1 mL 3   Rimegepant Sulfate (NURTEC) 75 MG TBDP Take 75 mg by mouth every other day. 16 tablet 3   traMADol (ULTRAM) 50 MG tablet Take 1 tablet (50 mg total) by mouth every 12 (twelve) hours as needed for moderate pain or severe pain. 15 tablet 2   No current facility-administered medications for this visit.   No results found.  Review of Systems:   A ROS was performed including pertinent positives and negatives as documented in the HPI.  Physical Exam :   Constitutional: NAD and appears stated age Neurological: Alert and oriented Psych: Appropriate affect and cooperative There were no vitals taken for this visit.   Comprehensive Musculoskeletal Exam:    Tenderness palpation about lateral trochanter on the left.  Range of motion is 30 degrees internal/external rotation of the left hip with 100 degrees of left hip flexion with minimal pain.  There is positive Trendelenburg gait with walking.  Weakness with resisted hip abduction on the left.  With regard to the right knee she has a negative Lachman.  There is lateral joint line tenderness and swelling.  No varus or valgus laxity.  Range of motion is from 0 to 130 degrees.  Previous incisions are well-healed Imaging:   Xray (4 view right knee): There is calcification involving the lateral meniscus consistent with possible chondrocalcinosis  MRI left hip: There is edema as well as undersurface tearing of the gluteus medius tendon of the left hip without significant atrophy.  There is some retraction of the tendon  I personally reviewed and  interpreted the radiographs.   Assessment:   41 y.o. female with left hip pain in the setting of a likely undersurface gluteus medius tear.  Side effect we did discuss treatment options.  I did discuss that now that she has trialed multiple bouts including 3 total of physical therapy for strengthening of the hip I do believe she would be a candidate for possible repair of the left gluteus medius.  That being said I would like to perform an ultrasound-guided injection over the gluteus medius tendon so we can better ascertain how many of her symptoms are coming from this area.  -Left ultrasound-guided hip injection performed after verbal consent obtained    Procedure Note  Patient: SHANDRIA CLINCH             Date of Birth: [birth year]           MRN: [id number]             Visit Date: 03/02/2022  Procedures: Visit Diagnoses:  No diagnosis found.   Large Joint Inj: L greater trochanter on 03/02/2022 11:26 AM Indications: pain Details: 22 G 3.5 in needle, ultrasound-guided anterolateral approach  Arthrogram: No  Medications: 4 mL lidocaine 1 %; 80 mg triamcinolone acetonide 40 MG/ML Outcome: tolerated well, no immediate complications Procedure, treatment alternatives, risks and benefits explained, specific risks discussed. Consent was given by the patient. Immediately prior to procedure a time out was called to verify the correct patient, procedure, equipment, support staff and site/side marked as required. Patient was prepped and draped in the usual sterile fashion.          I personally saw and evaluated the patient, and participated in the management and treatment plan.  Vanetta Mulders, MD Attending Physician, Orthopedic Surgery  This document was dictated using Dragon voice recognition software. A reasonable attempt at proof reading has been made to minimize errors.

## 2022-03-03 ENCOUNTER — Other Ambulatory Visit: Payer: Self-pay | Admitting: Internal Medicine

## 2022-03-03 ENCOUNTER — Other Ambulatory Visit (HOSPITAL_BASED_OUTPATIENT_CLINIC_OR_DEPARTMENT_OTHER): Payer: Self-pay

## 2022-03-03 DIAGNOSIS — G43009 Migraine without aura, not intractable, without status migrainosus: Secondary | ICD-10-CM

## 2022-03-03 MED ORDER — NURTEC 75 MG PO TBDP
75.0000 mg | ORAL_TABLET | ORAL | 3 refills | Status: DC
  Filled 2022-03-03: qty 16, 30d supply, fill #0
  Filled 2022-04-07: qty 16, 30d supply, fill #1
  Filled 2022-05-05: qty 16, 30d supply, fill #2
  Filled 2022-06-14 – 2022-06-21 (×2): qty 16, 30d supply, fill #3

## 2022-03-08 ENCOUNTER — Other Ambulatory Visit (HOSPITAL_BASED_OUTPATIENT_CLINIC_OR_DEPARTMENT_OTHER): Payer: Self-pay

## 2022-03-29 ENCOUNTER — Ambulatory Visit (HOSPITAL_BASED_OUTPATIENT_CLINIC_OR_DEPARTMENT_OTHER): Payer: Self-pay | Admitting: Orthopaedic Surgery

## 2022-03-29 ENCOUNTER — Ambulatory Visit (INDEPENDENT_AMBULATORY_CARE_PROVIDER_SITE_OTHER): Payer: 59 | Admitting: Orthopaedic Surgery

## 2022-03-29 DIAGNOSIS — M67959 Unspecified disorder of synovium and tendon, unspecified thigh: Secondary | ICD-10-CM

## 2022-03-29 NOTE — Progress Notes (Signed)
Chief Complaint: Knee, left hip pain     History of Present Illness:   03/29/2022: Presents today for follow-up of her left hip.  Overall she did get extremely good near full relief for 4 weeks following her gluteus injection.  This is starting to wear off and becoming painful again.  She states that she does occasionally feel like the tendon when she is standing up.   Sarah Gallagher is a 41 y.o. female presents here for right knee pain as well as left hip pain.  With regard to the right knee she has had several days of essentially atraumatic right knee pain although she did have a minor slip on the right knee.  Denies any feeling of instability or giving out of the right knee.  She states that she did have an ACL reconstruction done in high school with allograft.  She does have an incision over the patella tendon however.  She did also have a lateral meniscal repair done several months prior to her ACL reconstruction.  States that she overall has not had pain following this until recently.  Pain focally lateral isolated pain.  With regard to the left hip she has not had left hip pain for many years.  She has done physical therapy 3 different times and extensively.  She has had 2-3 different trochanteric injections and PRP was also recommended although at this time she continues to have persistent pain about the hip without any resolution of her symptoms.  She got several weeks of relief from injections but nothing like this.  She works as a Arts administrator at [employer].    Surgical History:   As above  PMH/PSH/Family History/Social History/Meds/Allergies:    Past Medical History:  Diagnosis Date   Arthritis    Chest discomfort    a. associated with palpitations.   Depression    Dyspnea on exertion    a. 12/2016 Echo: EF 55-60%, no rwma, nl PASP.   Juvenile rheumatoid arthritis (HCC)    Meniere disease    Migraines    Neuropathy     Palpitations    PVC's (premature ventricular contractions)    a. 01/2017 48h Holter: Rare PAC/PVC. Freq runs of sinus tachycardia.   Past Surgical History:  Procedure Laterality Date   KNEE SURGERY Right 1997, 1997   torn meniscus, ACL replacement     REFRACTIVE SURGERY     lasix   Social History   Socioeconomic History   Marital status: Married    Spouse name: ray   Number of children: 3   Years of education: BS   Highest education level: Bachelor's degree (e.g., BA, AB, BS)  Occupational History    Comment: [employer], Resp Therapist  Tobacco Use   Smoking status: Never   Smokeless tobacco: Never  Vaping Use   Vaping Use: Never used  Substance and Sexual Activity   Alcohol use: Not Currently    Alcohol/week: 0.0 standard drinks of alcohol    Comment: rare   Drug use: No   Sexual activity: Yes    Birth control/protection: Injection  Other Topics Concern   Not on file  Social History Narrative   Lives in [place] with husband and children.  Not currently exercising.  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   Caffeine 2-3  cups daily   Social Determinants of Health   Financial Resource Strain: Not on file  Food Insecurity: Not on file  Transportation Needs: Not on file  Physical Activity: Not on file  Stress: Not on file  Social Connections: Not on file   Family History  Problem Relation Age of Onset   Diabetes Mother    Memory loss Mother    Arthritis Father    Other Father        atrial flutter   Emphysema Father        smoker   Diabetes Maternal Aunt    Stroke Paternal Aunt    Diabetes Maternal Uncle    Arthritis Maternal Grandmother    Diabetes Maternal Grandmother    Cancer Maternal Grandmother        skin   Arthritis Paternal Grandmother    Cancer Paternal Grandmother        skin   Allergies  Allergen Reactions   Zithromax [Azithromycin] Other (See Comments)    "convulsions or tremors"   Current Outpatient Medications  Medication Sig Dispense  Refill   aspirin-acetaminophen-caffeine (EXCEDRIN MIGRAINE) 250-250-65 MG tablet Take by mouth daily.     clonazePAM (KLONOPIN) 0.5 MG tablet Take 0.5 mg by mouth 2 (two) times daily as needed.     influenza vac split quadrivalent PF (FLUARIX) 0.5 ML injection Inject into the muscle. 0.5 mL 0   medroxyPROGESTERone Acetate 150 MG/ML SUSY INJECT 1 ML INTO THE MUSCLE EVERY 3 MONTHS. 1 mL 3   Rimegepant Sulfate (NURTEC) 75 MG TBDP Take 75 mg by mouth every other day. 16 tablet 3   traMADol (ULTRAM) 50 MG tablet Take 1 tablet (50 mg total) by mouth every 12 (twelve) hours as needed for moderate pain or severe pain. 15 tablet 2   No current facility-administered medications for this visit.   No results found.  Review of Systems:   A ROS was performed including pertinent positives and negatives as documented in the HPI.  Physical Exam :   Constitutional: NAD and appears stated age Neurological: Alert and oriented Psych: Appropriate affect and cooperative There were no vitals taken for this visit.   Comprehensive Musculoskeletal Exam:    Tenderness palpation about lateral trochanter on the left.  Range of motion is 30 degrees internal/external rotation of the left hip with 100 degrees of left hip flexion with minimal pain.  There is positive Trendelenburg gait with walking.  Weakness with resisted hip abduction on the left.  With regard to the right knee she has a negative Lachman.  There is lateral joint line tenderness and swelling.  No varus or valgus laxity.  Range of motion is from 0 to 130 degrees.  Previous incisions are well-healed Imaging:   Xray (4 view right knee): There is calcification involving the lateral meniscus consistent with possible chondrocalcinosis  MRI left hip: There is edema as well as undersurface tearing of the gluteus medius tendon of the left hip without significant atrophy.  There is some retraction of the tendon  I personally reviewed and interpreted the  radiographs.   Assessment:   41 y.o. female with left hip pain in the setting of a likely undersurface gluteus medius tear.  At this time she has trialed physical therapy as well as injections.  Her last gluteus medius injection gave her near complete relief for prolonged period of time.  At this time given the fact that this is worn off we did discuss options.  Specifically we have  discussed surgical the nature open gluteus medius repair.  I did discuss limitations as well as rehab associated with this.  At this time she would like to proceed with this.  -Plan for left hip gluteus medius repair   After a lengthy discussion of treatment options, including risks, benefits, alternatives, complications of surgical and nonsurgical conservative options, the patient elected surgical repair.   The patient  is aware of the material risks  and complications including, but not limited to injury to adjacent structures, neurovascular injury, infection, numbness, bleeding, implant failure, thermal burns, stiffness, persistent pain, failure to heal, disease transmission from allograft, need for further surgery, dislocation, anesthetic risks, blood clots, risks of death,and others. The probabilities of surgical success and failure discussed with patient given their particular co-morbidities.The time and nature of expected rehabilitation and recovery was discussed.The patient's questions were all answered preoperatively.  No barriers to understanding were noted. I explained the natural history of the disease process and Rx rationale.  I explained to the patient what I considered to be reasonable expectations given their personal situation.  The final treatment plan was arrived at through a shared patient decision making process model.      I personally saw and evaluated the patient, and participated in the management and treatment plan.  Vanetta Mulders, MD Attending Physician, Orthopedic Surgery  This  document was dictated using Dragon voice recognition software. A reasonable attempt at proof reading has been made to minimize errors.

## 2022-04-08 ENCOUNTER — Other Ambulatory Visit (HOSPITAL_BASED_OUTPATIENT_CLINIC_OR_DEPARTMENT_OTHER): Payer: Self-pay

## 2022-04-12 ENCOUNTER — Telehealth: Payer: Self-pay | Admitting: Orthopaedic Surgery

## 2022-04-12 ENCOUNTER — Other Ambulatory Visit (HOSPITAL_BASED_OUTPATIENT_CLINIC_OR_DEPARTMENT_OTHER): Payer: Self-pay | Admitting: Orthopaedic Surgery

## 2022-04-12 ENCOUNTER — Other Ambulatory Visit (HOSPITAL_BASED_OUTPATIENT_CLINIC_OR_DEPARTMENT_OTHER): Payer: Self-pay

## 2022-04-12 MED ORDER — ASPIRIN 325 MG PO TBEC
325.0000 mg | DELAYED_RELEASE_TABLET | Freq: Every day | ORAL | 0 refills | Status: DC
  Filled 2022-04-12: qty 30, 30d supply, fill #0

## 2022-04-12 MED ORDER — ACETAMINOPHEN 500 MG PO TABS
500.0000 mg | ORAL_TABLET | Freq: Three times a day (TID) | ORAL | 0 refills | Status: AC
  Filled 2022-04-12: qty 30, 10d supply, fill #0

## 2022-04-12 MED ORDER — IBUPROFEN 800 MG PO TABS
800.0000 mg | ORAL_TABLET | Freq: Three times a day (TID) | ORAL | 0 refills | Status: AC
  Filled 2022-04-12: qty 30, 10d supply, fill #0

## 2022-04-12 MED ORDER — OXYCODONE HCL 5 MG PO TABS
5.0000 mg | ORAL_TABLET | ORAL | 0 refills | Status: DC | PRN
  Filled 2022-04-12: qty 20, 4d supply, fill #0

## 2022-04-12 NOTE — Telephone Encounter (Signed)
Patient stated Sammuel Hines said he would send in a Rx for her she is unsure of what it was for or the day of the appt, and patient has updated insurance info and would like a call about if Pre auth paperwork went through and if she would owe anything towards the surgery

## 2022-04-13 ENCOUNTER — Other Ambulatory Visit (HOSPITAL_BASED_OUTPATIENT_CLINIC_OR_DEPARTMENT_OTHER): Payer: Self-pay

## 2022-04-17 ENCOUNTER — Telehealth: Payer: Self-pay | Admitting: Orthopaedic Surgery

## 2022-04-17 NOTE — Telephone Encounter (Signed)
[employer] forms received. To [employer]. 

## 2022-04-21 ENCOUNTER — Telehealth: Payer: Self-pay | Admitting: Orthopaedic Surgery

## 2022-04-21 NOTE — Telephone Encounter (Signed)
Patient called in stating [employer] is asking Dr Sammuel Hines to send in a medical certificate for her FMLA please advise,Agent handeling case Juliann Mule [phone]

## 2022-04-21 NOTE — Telephone Encounter (Signed)
IC, spoke with patient. I explained that [employer] has sent forms to be complete and that is what they need returned. I advised patient of $25 form fee and auth that needs to be signed. She will come in on Monday to take of payment and auth, she will ask for me.

## 2022-04-21 NOTE — Telephone Encounter (Signed)
I spoke with patient in reference to her surgery. Patient actually had questions in reference to disability paperwork. Patient had already spoken to [place] in medical records concerning this. All patient questions had been answered.

## 2022-04-25 ENCOUNTER — Telehealth: Payer: Self-pay | Admitting: Orthopaedic Surgery

## 2022-04-25 NOTE — Telephone Encounter (Signed)
Patient came in to fill out Prior Auth forms and $25 check

## 2022-05-01 ENCOUNTER — Encounter: Payer: 59 | Attending: Psychology | Admitting: Psychology

## 2022-05-01 ENCOUNTER — Encounter: Payer: Self-pay | Admitting: Psychology

## 2022-05-01 DIAGNOSIS — Z8659 Personal history of other mental and behavioral disorders: Secondary | ICD-10-CM | POA: Diagnosis not present

## 2022-05-01 DIAGNOSIS — F411 Generalized anxiety disorder: Secondary | ICD-10-CM

## 2022-05-01 DIAGNOSIS — R41840 Attention and concentration deficit: Secondary | ICD-10-CM | POA: Insufficient documentation

## 2022-05-01 NOTE — Progress Notes (Signed)
Neuropsychological Consultation   Patient:   Sarah Gallagher   DOB:   Aug 29, 1980  MR Number:  [id number]  Location:  [facility] [place], [place] [id number] [place] [id number] Dept: [phone]           Date of Service:   05/01/2022  Start Time:   1 PM End Time:   3 PM  Today's visit was an in person visit was conducted in my outpatient clinic office.  The patient myself were present.  1 hour and 15 minutes was spent in face-to-face clinical interview and the other 45 minutes was spent with records review, report writing and setting up testing protocols.  Provider/Observer:  Ilean Skill, Psy.D.       Clinical Neuropsychologist       Billing Code/Service: 96116/96121  Reason for Service:    Sarah Gallagher is a 42 year old female referred for neuropsychological evaluation as part of her overall workup to aid in differential diagnosis around concerns related to attentional difficulties, anxiety disorder, history of panic attacks in the ways to best manage these.  The patient was referred by her treating psychiatrist Ursula Alert, MD after an initial referral to her by the patient's PCP Ihor Dow, MD.  The patient has a past medical history including history of recurrent migraine headaches, chronic hip pain, history of depression/anxiety, attention and focus deficits, chart review malformation, Mnire's disease.  The patient also has a history of cervical radiculopathy as well as tinnitus in both ears.  The patient describes her attention and concentration difficulties as related to difficulty keeping on track and that she will start activities or responsibilities and get derailed quite easily, she reports difficulties finding the words sufficiently that she wants to fine and difficulties with sustaining attention.  Patient reports that the symptoms appeared to improve when  she was taking a stimulant like medication for weight loss previously.  The patient reports that she was initially seen a primary care physician in [place] who had prescribed a medication for weight loss that involved some stimulant type properties.  Patient noted that she felt much better from a cognitive and attentional standpoint with this medicine and they talked about once this medicine for her weight loss.  That they would consider ADHD type medications.  However, she changed providers and this was never initiated.  The patient's current PCP had concerns over what the best strategy was psychotropic really and made a referral to psychiatry for an evaluation and assessment with recommendations.  The patient reports that she had significant psychosocial stressors including marital stressors and pain conditions that persisted for some time and psychosocial stressors likely led to the development of panic attacks.  Patient reports that these full-blown panic attacks stopped around 6 or 7 years ago but she still has anxiety.  The patient had significant pain in her hip for some time and saw multiple doctors and more recently was identified as being caused by a torn tendon in her gluteus maximus and she is scheduled for surgical repair soon.  The patient reports that she had always been a very active and the subsequent significant reduction in physical activity had a significant impact on the patient.  Along with the patient continuing to struggle with anxiety and depressive type of symptoms over the past several years with episodes of full-blown panic attacks ending roughly 6 or 7 years ago the patient has continued to struggle  with attention and concentration as well.  The patient reports that she did well in elementary and middle school academically as she was always gifted intellectually.  Patient for started noticing issues with attention around the 6 grade and would often get distracted become  hyperfocused on aspects around the room rather than paying attention to the teacher.  There were changes in her life including developing outside hobbies and interest in developing new friends and she had increasing trouble in the seventh grade.  Her parents separated between the seventh and eighth grade all of which causing changes and more problems in school.  Patient reports that she really did not start noting significant attentional issues till the sixth or seventh grade.  However, the patient reports that she always remembers being very aware and concerned about what other people are thinking of her and how they perceived her.  She [place] in [place] very [place] town where everyone essentially "knew each other."  The patient reports that this feeling of others being aware of what is going on in her life persist to this day and the acute psychosocial stressors she experienced 7 or 8 years ago really exacerbated the symptoms.  The patient has been tried on a number of different psychotropic medications including Lexapro, Prozac, BuSpar but the patient felt that all of these medications either made her symptoms worse or did not help and she did not stay on them.  The patient reports that she continues to feel overwhelmed by stressors in her life but has not been having full-blown panic attacks.  The patient was prescribed a low-dose Xanax in the past and she has used it once or twice a year during panic events.  The patient has had a lot of psychosocial stressors particularly around marital concerns.  There were significant relationship struggles due to infidelity issues with her spouse in the past and the connections between these Fidelity issues and her children school and people that worked at the school.  There continue to be ongoing stressors within the marriage but they have improved as far as relationship issues to some degree more recently.  The patient did engage in some counseling through [facility]  back in 2017.  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  During particular difficult times in the marriage the patient's husband is described as making direct statements around possible suicidal ideation to both the patient as well as their children that have caused a great deal of distress.  The patient does not currently fear for her safety although it is not clear whether these statements and written documents from her husband at the time were of a manipulative/cry for help type of nature or truly intentions of self-harm.  This, however, was very stressful.  Currently, the patient is on limited psychotropic medications.  The patient does have a prescription for clonazepam which she has very limited use during significant anxiety events but takes it very infrequently.  The patient also takes medications for pain including oxycodone and has taken tramadol in the past but hopefully the orthopedic interventions for her hip pain will be helpful in that regard.  The patient  deals with chronic migraine but has initiated Nurtec more recently which is produced significant improvements in the frequency, intensity and duration of headaches.  She was taking other medications for her migraines which were somewhat helpful but not to the degree the Nurtec has been.  The patient has no history of significant concussive events, working around toxic materials and/or solvents.  The patient reports some difficulties with sleep.  The patient reports that she will go to bed around 9 PM and then wake up once during the night roughly between 2 and 3 AM because the "dogs will  wake her up" and she will let the dogs out and go to the bathroom before returning to bed.  The patient reports that she will then wake up between 5 and 6 AM and be up for the rest of the day but reports that she does not feel rested.  The patient denies being told that she snores at night but I did address the potential benefit for at least ruling out possibility of obstructive sleep apnea although this is a low probable finding.  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  The patient does have other significant psychosocial stressors beyond marital issues including the changing health status of her parents, significant conflict with one of her brothers and her ongoing significant pain due to her hip injury which hopefully will be addressed soon through orthopedic interventions.   Behavioral Observation: RHODIA ACRES  presents as a 42 y.o.-year-old Right handed Caucasian Female who appeared her stated age. her dress was Appropriate and she was Well Groomed and her manners were Appropriate to the situation.  her participation was indicative of Appropriate behaviors.  There were physical disabilities noted related to gait change consistent with hip pain.  she displayed an appropriate level of cooperation and motivation.    Interactions:    Active Appropriate  Attention:   abnormal and attention span appeared shorter than expected for age  Memory:   within normal limits; recent and remote memory intact  Visuo-spatial:  not examined  Speech (Volume):  normal  Speech:   normal; normal  Thought Process:  Coherent and Relevant  Though Content:  WNL; not suicidal and not homicidal  Orientation:   person, place, time/date, and  situation  Judgment:   Good  Planning:   Good  Affect:    Anxious  Mood:    Anxious  Insight:   Good  Intelligence:   high  Marital Status/Living: The patient was born and raised in [place] along with 1 sibling.  She had juvenile rheumatoid arthritis but no other significant medical issues.  The patient did have early difficulties learning to spell but always did very well in school and generally.  The patient is married and continues to live with her husband and 3 children and they have been married for 20 years.  She has a 29 year old son who was diagnosed with ADHD, anxiety and depression, 44 year old girl with no particular psychiatric issues and 74 year old son who has been diagnosed with ADHD.  The 31 year old son has taken Focalin and Prozac and the 37 year old son is taking Focalin.  There have been some significant issues related to the patient's marriage and it has improved more recently but was complicated due to Fidelity issues with her husband.  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  She works during the week with home schooling duty for her 3 children.  Past Employment:  The patient has worked in [employer] for 24 years.  Hobbies and interests include gardening.  Substance Use:  No concerns of substance abuse are reported.    Education:   The patient graduated from the [employer] with her bachelor's of science degree maintaining an excellent GPA (3.8) .  The patient always did very well in math and had some relative difficulties with English and reading but always did well academically.  Extracurricular activities in high school included playing volleyball.  Medical History:   Past Medical History:  Diagnosis Date   Arthritis    Chest discomfort    a. associated with palpitations.   Depression    Dyspnea on exertion    a. 12/2016 Echo: EF 55-60%, no rwma,  nl PASP.   Juvenile rheumatoid arthritis (HCC)    Meniere disease    Migraines    Neuropathy    Palpitations    PVC's (premature ventricular contractions)    a. 01/2017 48h Holter: Rare PAC/PVC. Freq runs of sinus tachycardia.         Patient Active Problem List   Diagnosis Date Noted   Encounter for general adult medical examination with abnormal findings 11/30/2021   Attention and concentration deficit 10/18/2021   Tinnitus of both ears 08/25/2021   Imbalance 08/25/2021   Abnormal auditory perception of both ears 08/25/2021   Migraine 10/29/2020   Overweight 10/29/2020   Dysplasia of cervix, low grade (CIN 1) 10/16/2018   Chiari I malformation ([facility]) 10/16/2018   Cervical radiculopathy 09/06/2018   Chronic hip pain, left 09/06/2018   Iron deficiency 09/06/2018   Anxiety disorder 11/27/2017              Abuse/Trauma History: The time around marital discord with her husband was very stressful and traumatic with the patient's husband at 1 point making threats of suicide by writing a letter "saying goodbye" to the patient and their 3 children.  This was an extremely stressful period of time that continues to have a lot of stress ongoing.  There are a lot of other psychosocial stressors including the patient taking care of her parents and they are developing and worsening health issues.  Psychiatric History:  Patient has previous diagnoses of anxiety, depression and has a past history of panic events and panic attacks that last had full-blown panic attacks 6 or 7 years ago.  Family Med/Psych History:  Family History  Problem Relation Age of Onset   Diabetes Mother    Memory loss Mother    Arthritis Father    Other Father        atrial flutter   Emphysema Father        smoker   Diabetes Maternal Aunt    Stroke Paternal Aunt    Diabetes Maternal Uncle    Arthritis Maternal Grandmother    Diabetes Maternal Grandmother    Cancer Maternal Grandmother        skin   Arthritis  Paternal Grandmother    Cancer Paternal Grandmother        skin    Risk of Suicide/Violence: low the patient does not report that she feels a great deal of fear or worry about her husband engaging in any type of violent activity but that has been a concern previously and her husband worked in [employer]  enforcement and there was a lot of stressors around infidelity of her husband.  Impression/DX:  JORITA BOHANON is a 42 year old female referred for neuropsychological evaluation as part of her overall workup to aid in differential diagnosis around concerns related to attentional difficulties, anxiety disorder, history of panic attacks in the ways to best manage these.  The patient was referred by her treating psychiatrist Ursula Alert, MD after an initial referral to her by the patient's PCP Ihor Dow, MD.  The patient has a past medical history including history of recurrent migraine headaches, chronic hip pain, history of depression/anxiety, attention and focus deficits, chart review malformation, Mnire's disease.  The patient also has a history of cervical radiculopathy as well as tinnitus in both ears.  Disposition/Plan:  We have set the patient up for formal neuropsychological testing to objectively assess a wide range of attentional factors and attentional components to aid in differential diagnosis.  Once this formal objective assessment is completed a formal report will be written and made available to her referring psychiatrist as well as her PCP and I will sit down with the patient and go over the results and recommendations in detail.  Diagnosis:    Attention and concentration deficit  Generalized anxiety disorder  History of panic attacks         Electronically Signed   _______________________ Ilean Skill, Psy.D. Clinical Neuropsychologist

## 2022-05-05 ENCOUNTER — Telehealth: Payer: Self-pay | Admitting: Orthopaedic Surgery

## 2022-05-05 ENCOUNTER — Other Ambulatory Visit: Payer: Self-pay

## 2022-05-05 NOTE — Telephone Encounter (Signed)
Patient called office stating that [facility] needs Dr. Eddie Dibbles email address to complete pt's forms online. She is having an upcoming surgery. I offered her a fax number and she stated that they are refusing to fax, that this needs to be done online. Please call patient about this matter. [phone]

## 2022-05-05 NOTE — Telephone Encounter (Signed)
IC patient. I gave her my email so she can complete the required field. I will watch for anything that may come in. She will remain active in the claim process.

## 2022-05-11 ENCOUNTER — Telehealth: Payer: Self-pay | Admitting: Orthopaedic Surgery

## 2022-05-11 ENCOUNTER — Encounter (HOSPITAL_BASED_OUTPATIENT_CLINIC_OR_DEPARTMENT_OTHER): Payer: Self-pay | Admitting: Orthopaedic Surgery

## 2022-05-11 NOTE — Telephone Encounter (Signed)
Patient scheduled for gluteus medius repair on 2/20. Please advise how long patient will be out of work and provide note. Thank you.

## 2022-05-11 NOTE — Telephone Encounter (Signed)
Noted for [facility].

## 2022-05-16 ENCOUNTER — Encounter (HOSPITAL_BASED_OUTPATIENT_CLINIC_OR_DEPARTMENT_OTHER): Payer: Self-pay | Admitting: Orthopaedic Surgery

## 2022-05-16 ENCOUNTER — Other Ambulatory Visit: Payer: Self-pay

## 2022-05-22 NOTE — Anesthesia Preprocedure Evaluation (Signed)
Anesthesia Evaluation  Patient identified by MRN, date of birth, ID band Patient awake    Reviewed: Allergy & Precautions, NPO status , Patient's Chart, lab work & pertinent test results  History of Anesthesia Complications (+) PONV and history of anesthetic complications  Airway Mallampati: I       Dental no notable dental hx.    Pulmonary neg pulmonary ROS   Pulmonary exam normal        Cardiovascular negative cardio ROS Normal cardiovascular exam     Neuro/Psych  Headaches PSYCHIATRIC DISORDERS Anxiety Depression     Neuromuscular disease    GI/Hepatic negative GI ROS, Neg liver ROS,,,  Endo/Other  negative endocrine ROS    Renal/GU negative Renal ROS  negative genitourinary   Musculoskeletal  (+) Arthritis , Rheumatoid disorders,    Abdominal Normal abdominal exam  (+)   Peds  Hematology   Anesthesia Other Findings   Reproductive/Obstetrics negative OB ROS                             Anesthesia Physical Anesthesia Plan  ASA: 2  Anesthesia Plan: General   Post-op Pain Management:    Induction: Intravenous  PONV Risk Score and Plan: 4 or greater and Ondansetron, Dexamethasone and Midazolam  Airway Management Planned: LMA and Oral ETT  Additional Equipment: None  Intra-op Plan:   Post-operative Plan: Extubation in OR  Informed Consent: I have reviewed the patients History and Physical, chart, labs and discussed the procedure including the risks, benefits and alternatives for the proposed anesthesia with the patient or authorized representative who has indicated his/her understanding and acceptance.     Dental advisory given  Plan Discussed with: CRNA  Anesthesia Plan Comments:        Anesthesia Quick Evaluation

## 2022-05-23 ENCOUNTER — Ambulatory Visit (HOSPITAL_BASED_OUTPATIENT_CLINIC_OR_DEPARTMENT_OTHER)
Admission: RE | Admit: 2022-05-23 | Discharge: 2022-05-23 | Disposition: A | Discharge status: Home / Self Care | Payer: 59 | Attending: Orthopaedic Surgery | Admitting: Orthopaedic Surgery

## 2022-05-23 ENCOUNTER — Other Ambulatory Visit: Payer: Self-pay

## 2022-05-23 ENCOUNTER — Encounter (HOSPITAL_BASED_OUTPATIENT_CLINIC_OR_DEPARTMENT_OTHER): Payer: Self-pay | Admitting: Orthopaedic Surgery

## 2022-05-23 ENCOUNTER — Encounter (HOSPITAL_BASED_OUTPATIENT_CLINIC_OR_DEPARTMENT_OTHER)
Admission: RE | Disposition: A | Discharge status: Home / Self Care | Payer: Self-pay | Source: Home / Self Care | Attending: Orthopaedic Surgery

## 2022-05-23 ENCOUNTER — Ambulatory Visit (HOSPITAL_BASED_OUTPATIENT_CLINIC_OR_DEPARTMENT_OTHER): Payer: 59 | Admitting: Certified Registered"

## 2022-05-23 DIAGNOSIS — M0800 Unspecified juvenile rheumatoid arthritis of unspecified site: Secondary | ICD-10-CM | POA: Diagnosis not present

## 2022-05-23 DIAGNOSIS — F32A Depression, unspecified: Secondary | ICD-10-CM | POA: Diagnosis not present

## 2022-05-23 DIAGNOSIS — F419 Anxiety disorder, unspecified: Secondary | ICD-10-CM | POA: Diagnosis not present

## 2022-05-23 DIAGNOSIS — S76012D Strain of muscle, fascia and tendon of left hip, subsequent encounter: Secondary | ICD-10-CM

## 2022-05-23 DIAGNOSIS — Z01818 Encounter for other preprocedural examination: Secondary | ICD-10-CM

## 2022-05-23 DIAGNOSIS — M67952 Unspecified disorder of synovium and tendon, left thigh: Secondary | ICD-10-CM | POA: Diagnosis not present

## 2022-05-23 DIAGNOSIS — Z793 Long term (current) use of hormonal contraceptives: Secondary | ICD-10-CM | POA: Diagnosis not present

## 2022-05-23 DIAGNOSIS — Z79899 Other long term (current) drug therapy: Secondary | ICD-10-CM | POA: Insufficient documentation

## 2022-05-23 DIAGNOSIS — X58XXXA Exposure to other specified factors, initial encounter: Secondary | ICD-10-CM | POA: Diagnosis not present

## 2022-05-23 DIAGNOSIS — S76012A Strain of muscle, fascia and tendon of left hip, initial encounter: Secondary | ICD-10-CM | POA: Insufficient documentation

## 2022-05-23 DIAGNOSIS — M67959 Unspecified disorder of synovium and tendon, unspecified thigh: Secondary | ICD-10-CM

## 2022-05-23 HISTORY — DX: Anxiety disorder, unspecified: F41.9

## 2022-05-23 HISTORY — DX: Other specified postprocedural states: Z98.890

## 2022-05-23 HISTORY — DX: Other specified postprocedural states: R11.2

## 2022-05-23 HISTORY — PX: GLUTEUS MINIMUS REPAIR: SHX5843

## 2022-05-23 LAB — POCT PREGNANCY, URINE: Preg Test, Ur: NEGATIVE

## 2022-05-23 SURGERY — REPAIR, TENDON, GLUTEUS MINIMUS
Anesthesia: General | Site: Hip | Laterality: Left

## 2022-05-23 MED ORDER — SUGAMMADEX SODIUM 200 MG/2ML IV SOLN
INTRAVENOUS | Status: DC | PRN
  Administered 2022-05-23: 200 mg via INTRAVENOUS

## 2022-05-23 MED ORDER — DEXAMETHASONE SODIUM PHOSPHATE 10 MG/ML IJ SOLN
INTRAMUSCULAR | Status: DC | PRN
  Administered 2022-05-23: 10 mg via INTRAVENOUS

## 2022-05-23 MED ORDER — 0.9 % SODIUM CHLORIDE (POUR BTL) OPTIME
TOPICAL | Status: DC | PRN
  Administered 2022-05-23: 200 mL

## 2022-05-23 MED ORDER — BUPIVACAINE HCL (PF) 0.25 % IJ SOLN
INTRAMUSCULAR | Status: DC | PRN
  Administered 2022-05-23: 20 mL

## 2022-05-23 MED ORDER — OXYCODONE HCL 5 MG/5ML PO SOLN: 5.0000 mg | Freq: Once | ORAL | Status: AC | PRN

## 2022-05-23 MED ORDER — ACETAMINOPHEN 160 MG/5ML PO SOLN: 325.0000 mg | ORAL | Status: DC | PRN

## 2022-05-23 MED ORDER — PROPOFOL 10 MG/ML IV BOLUS
INTRAVENOUS | Status: DC | PRN
  Administered 2022-05-23: 200 mg via INTRAVENOUS

## 2022-05-23 MED ORDER — TRANEXAMIC ACID-NACL 1000-0.7 MG/100ML-% IV SOLN
INTRAVENOUS | Status: AC
  Filled 2022-05-23: qty 100

## 2022-05-23 MED ORDER — TRANEXAMIC ACID-NACL 1000-0.7 MG/100ML-% IV SOLN
1000.0000 mg | INTRAVENOUS | Status: AC
  Administered 2022-05-23: 1000 mg via INTRAVENOUS

## 2022-05-23 MED ORDER — KETOROLAC TROMETHAMINE 30 MG/ML IJ SOLN: 30.0000 mg | Freq: Once | INTRAMUSCULAR | Status: DC | PRN

## 2022-05-23 MED ORDER — PROPOFOL 10 MG/ML IV BOLUS
INTRAVENOUS | Status: AC
  Filled 2022-05-23: qty 20

## 2022-05-23 MED ORDER — ACETAMINOPHEN 500 MG PO TABS
1000.0000 mg | ORAL_TABLET | Freq: Once | ORAL | Status: AC
  Administered 2022-05-23: 1000 mg via ORAL

## 2022-05-23 MED ORDER — SCOPOLAMINE 1 MG/3DAYS TD PT72
1.0000 | MEDICATED_PATCH | TRANSDERMAL | Status: DC
  Administered 2022-05-23: 1.5 mg via TRANSDERMAL

## 2022-05-23 MED ORDER — MIDAZOLAM HCL 2 MG/2ML IJ SOLN
INTRAMUSCULAR | Status: AC
  Filled 2022-05-23: qty 2

## 2022-05-23 MED ORDER — ACETAMINOPHEN 500 MG PO TABS
ORAL_TABLET | ORAL | Status: AC
  Filled 2022-05-23: qty 2

## 2022-05-23 MED ORDER — ACETAMINOPHEN 325 MG PO TABS: 325.0000 mg | ORAL_TABLET | ORAL | Status: DC | PRN

## 2022-05-23 MED ORDER — ONDANSETRON HCL 4 MG/2ML IJ SOLN
INTRAMUSCULAR | Status: AC
  Filled 2022-05-23: qty 2

## 2022-05-23 MED ORDER — VANCOMYCIN HCL 1000 MG IV SOLR
INTRAVENOUS | Status: AC
  Filled 2022-05-23: qty 40

## 2022-05-23 MED ORDER — FENTANYL CITRATE (PF) 100 MCG/2ML IJ SOLN
25.0000 ug | INTRAMUSCULAR | Status: DC | PRN
  Administered 2022-05-23: 50 ug via INTRAVENOUS

## 2022-05-23 MED ORDER — ONDANSETRON HCL 4 MG/2ML IJ SOLN
4.0000 mg | Freq: Once | INTRAMUSCULAR | Status: AC | PRN
  Administered 2022-05-23: 4 mg via INTRAVENOUS

## 2022-05-23 MED ORDER — FENTANYL CITRATE (PF) 100 MCG/2ML IJ SOLN
INTRAMUSCULAR | Status: DC | PRN
  Administered 2022-05-23: 100 ug via INTRAVENOUS

## 2022-05-23 MED ORDER — ROCURONIUM BROMIDE 100 MG/10ML IV SOLN
INTRAVENOUS | Status: DC | PRN
  Administered 2022-05-23: 50 mg via INTRAVENOUS

## 2022-05-23 MED ORDER — CEFAZOLIN SODIUM-DEXTROSE 2-4 GM/100ML-% IV SOLN
INTRAVENOUS | Status: AC
  Filled 2022-05-23: qty 100

## 2022-05-23 MED ORDER — LABETALOL HCL 5 MG/ML IV SOLN
INTRAVENOUS | Status: AC
  Filled 2022-05-23: qty 4

## 2022-05-23 MED ORDER — ROCURONIUM BROMIDE 10 MG/ML (PF) SYRINGE
PREFILLED_SYRINGE | INTRAVENOUS | Status: AC
  Filled 2022-05-23: qty 10

## 2022-05-23 MED ORDER — DEXAMETHASONE SODIUM PHOSPHATE 10 MG/ML IJ SOLN
INTRAMUSCULAR | Status: AC
  Filled 2022-05-23: qty 1

## 2022-05-23 MED ORDER — LIDOCAINE 2% (20 MG/ML) 5 ML SYRINGE
INTRAMUSCULAR | Status: DC | PRN
  Administered 2022-05-23: 100 mg via INTRAVENOUS

## 2022-05-23 MED ORDER — CEFAZOLIN SODIUM-DEXTROSE 2-4 GM/100ML-% IV SOLN
2.0000 g | INTRAVENOUS | Status: AC
  Administered 2022-05-23: 2 g via INTRAVENOUS

## 2022-05-23 MED ORDER — LACTATED RINGERS IV SOLN: INTRAVENOUS | Status: DC

## 2022-05-23 MED ORDER — FENTANYL CITRATE (PF) 100 MCG/2ML IJ SOLN
INTRAMUSCULAR | Status: AC
  Filled 2022-05-23: qty 2

## 2022-05-23 MED ORDER — ONDANSETRON HCL 4 MG/2ML IJ SOLN
INTRAMUSCULAR | Status: DC | PRN
  Administered 2022-05-23: 4 mg via INTRAVENOUS

## 2022-05-23 MED ORDER — GABAPENTIN 300 MG PO CAPS
300.0000 mg | ORAL_CAPSULE | Freq: Once | ORAL | Status: AC
  Administered 2022-05-23: 300 mg via ORAL

## 2022-05-23 MED ORDER — ATROPINE SULFATE 0.4 MG/ML IV SOLN
INTRAVENOUS | Status: AC
  Filled 2022-05-23: qty 1

## 2022-05-23 MED ORDER — LIDOCAINE 2% (20 MG/ML) 5 ML SYRINGE
INTRAMUSCULAR | Status: AC
  Filled 2022-05-23: qty 5

## 2022-05-23 MED ORDER — OXYCODONE HCL 5 MG PO TABS
5.0000 mg | ORAL_TABLET | Freq: Once | ORAL | Status: AC | PRN
  Administered 2022-05-23: 5 mg via ORAL

## 2022-05-23 MED ORDER — OXYCODONE HCL 5 MG PO TABS
ORAL_TABLET | ORAL | Status: AC
  Filled 2022-05-23: qty 1

## 2022-05-23 MED ORDER — MEPERIDINE HCL 25 MG/ML IJ SOLN: 6.2500 mg | INTRAMUSCULAR | Status: DC | PRN

## 2022-05-23 MED ORDER — SCOPOLAMINE 1 MG/3DAYS TD PT72
MEDICATED_PATCH | TRANSDERMAL | Status: AC
  Filled 2022-05-23: qty 1

## 2022-05-23 MED ORDER — MIDAZOLAM HCL 5 MG/5ML IJ SOLN
INTRAMUSCULAR | Status: DC | PRN
  Administered 2022-05-23: 2 mg via INTRAVENOUS

## 2022-05-23 MED ORDER — GABAPENTIN 300 MG PO CAPS
ORAL_CAPSULE | ORAL | Status: AC
  Filled 2022-05-23: qty 1

## 2022-05-23 MED ORDER — BUPIVACAINE HCL (PF) 0.25 % IJ SOLN
INTRAMUSCULAR | Status: AC
  Filled 2022-05-23: qty 30

## 2022-05-23 MED ORDER — BUPIVACAINE HCL (PF) 0.5 % IJ SOLN
INTRAMUSCULAR | Status: AC
  Filled 2022-05-23: qty 30

## 2022-05-23 SURGICAL SUPPLY — 56 items
ADH SKN CLS APL DERMABOND .7 (GAUZE/BANDAGES/DRESSINGS) ×1
ANCH SUT 2 JK 1.5X2.9 2 LD (Anchor) ×2 IMPLANT
ANCH SUT KNTLS STRL SHLDR SYS (Anchor) ×2 IMPLANT
ANCHOR SUT JK SZ 2 2.9 DBL SL (Anchor) IMPLANT
ANCHOR SUT QUATTRO KNTLS 4.5 (Anchor) IMPLANT
APL PRP STRL LF DISP 70% ISPRP (MISCELLANEOUS) ×1
BLADE SURG 10 STRL SS (BLADE) ×1 IMPLANT
BLADE SURG 15 STRL LF DISP TIS (BLADE) ×2 IMPLANT
BLADE SURG 15 STRL SS (BLADE) ×2
CANISTER SUCT 1200ML W/VALVE (MISCELLANEOUS) ×1 IMPLANT
CHLORAPREP W/TINT 26 (MISCELLANEOUS) ×1 IMPLANT
CLSR STERI-STRIP ANTIMIC 1/2X4 (GAUZE/BANDAGES/DRESSINGS) ×1 IMPLANT
COOLER ICEMAN CLASSIC (MISCELLANEOUS) IMPLANT
DERMABOND ADVANCED .7 DNX12 (GAUZE/BANDAGES/DRESSINGS) IMPLANT
DRAPE IMP U-DRAPE 54X76 (DRAPES) IMPLANT
DRAPE INCISE IOBAN 66X45 STRL (DRAPES) IMPLANT
DRAPE STERI IOBAN 125X83 (DRAPES) ×1 IMPLANT
DRAPE U-SHAPE 47X51 STRL (DRAPES) ×1 IMPLANT
DRAPE U-SHAPE 76X120 STRL (DRAPES) ×1 IMPLANT
DRSG AQUACEL AG ADV 3.5X 6 (GAUZE/BANDAGES/DRESSINGS) ×1 IMPLANT
DRSG AQUACEL AG ADV 3.5X10 (GAUZE/BANDAGES/DRESSINGS) IMPLANT
ELECT BLADE 4.0 EZ CLEAN MEGAD (MISCELLANEOUS) ×1
ELECT REM PT RETURN 9FT ADLT (ELECTROSURGICAL) ×1
ELECTRODE BLDE 4.0 EZ CLN MEGD (MISCELLANEOUS) ×1 IMPLANT
ELECTRODE REM PT RTRN 9FT ADLT (ELECTROSURGICAL) ×1 IMPLANT
GAUZE 4X4 16PLY ~~LOC~~+RFID DBL (SPONGE) IMPLANT
GAUZE PAD ABD 8X10 STRL (GAUZE/BANDAGES/DRESSINGS) IMPLANT
GAUZE XEROFORM 1X8 LF (GAUZE/BANDAGES/DRESSINGS) IMPLANT
GLOVE BIO SURGEON STRL SZ 6 (GLOVE) ×3 IMPLANT
GLOVE BIO SURGEON STRL SZ7.5 (GLOVE) ×2 IMPLANT
GLOVE BIOGEL PI IND STRL 6.5 (GLOVE) ×1 IMPLANT
GLOVE BIOGEL PI IND STRL 8 (GLOVE) ×1 IMPLANT
GOWN STRL REUS W/ TWL LRG LVL3 (GOWN DISPOSABLE) ×1 IMPLANT
GOWN STRL REUS W/TWL LRG LVL3 (GOWN DISPOSABLE) ×1
GOWN STRL REUS W/TWL XL LVL3 (GOWN DISPOSABLE) ×1 IMPLANT
MANIFOLD NEPTUNE II (INSTRUMENTS) ×1 IMPLANT
NS IRRIG 1000ML POUR BTL (IV SOLUTION) ×1 IMPLANT
PACK ARTHROSCOPY DSU (CUSTOM PROCEDURE TRAY) ×1 IMPLANT
PACK BASIN DAY SURGERY FS (CUSTOM PROCEDURE TRAY) ×1 IMPLANT
PAD COLD SHLDR WRAP-ON (PAD) IMPLANT
PENCIL SMOKE EVACUATOR (MISCELLANEOUS) ×1 IMPLANT
SHEET MEDIUM DRAPE 40X70 STRL (DRAPES) ×1 IMPLANT
SPIKE FLUID TRANSFER (MISCELLANEOUS) IMPLANT
SPONGE T-LAP 18X18 ~~LOC~~+RFID (SPONGE) ×1 IMPLANT
SUCTION FRAZIER HANDLE 10FR (MISCELLANEOUS) ×1
SUCTION TUBE FRAZIER 10FR DISP (MISCELLANEOUS) ×1 IMPLANT
SUT MNCRL AB 3-0 PS2 27 (SUTURE) ×1 IMPLANT
SUT VIC AB 0 CT1 27 (SUTURE) ×1
SUT VIC AB 0 CT1 27XBRD ANBCTR (SUTURE) ×1 IMPLANT
SUT VIC AB 0 CT1 27XCR 8 STRN (SUTURE) ×1 IMPLANT
SUT VIC AB 2-0 CT1 27 (SUTURE) ×1
SUT VIC AB 2-0 CT1 TAPERPNT 27 (SUTURE) ×1 IMPLANT
SYR BULB EAR ULCER 3OZ GRN STR (SYRINGE) ×1 IMPLANT
TOWEL GREEN STERILE FF (TOWEL DISPOSABLE) ×2 IMPLANT
UNDERPAD 30X36 HEAVY ABSORB (UNDERPADS AND DIAPERS) IMPLANT
YANKAUER SUCT BULB TIP NO VENT (SUCTIONS) ×1 IMPLANT

## 2022-05-23 NOTE — H&P (Signed)
Chief Complaint: Knee, left hip pain        History of Present Illness:    03/29/2022: Presents today for follow-up of her left hip.  Overall she did get extremely good near full relief for 4 weeks following her gluteus injection.  This is starting to wear off and becoming painful again.  She states that she does occasionally feel like the tendon when she is standing up.     Sarah Gallagher is a 42 y.o. female presents here for right knee pain as well as left hip pain.  With regard to the right knee she has had several days of essentially atraumatic right knee pain although she did have a minor slip on the right knee.  Denies any feeling of instability or giving out of the right knee.  She states that she did have an ACL reconstruction done in high school with allograft.  She does have an incision over the patella tendon however.  She did also have a lateral meniscal repair done several months prior to her ACL reconstruction.  States that she overall has not had pain following this until recently.  Pain focally lateral isolated pain.   With regard to the left hip she has not had left hip pain for many years.  She has done physical therapy 3 different times and extensively.  She has had 2-3 different trochanteric injections and PRP was also recommended although at this time she continues to have persistent pain about the hip without any resolution of her symptoms.  She got several weeks of relief from injections but nothing like this.  She works as a Arts administrator at [employer].       Surgical History:   As above   PMH/PSH/Family History/Social History/Meds/Allergies:         Past Medical History:  Diagnosis Date   Arthritis     Chest discomfort      a. associated with palpitations.   Depression     Dyspnea on exertion      a. 12/2016 Echo: EF 55-60%, no rwma, nl PASP.   Juvenile rheumatoid arthritis (HCC)     Meniere disease     Migraines      Neuropathy     Palpitations     PVC's (premature ventricular contractions)      a. 01/2017 48h Holter: Rare PAC/PVC. Freq runs of sinus tachycardia.         Past Surgical History:  Procedure Laterality Date   KNEE SURGERY Right 1997, 1997    torn meniscus, ACL replacement     REFRACTIVE SURGERY        lasix    Social History         Socioeconomic History   Marital status: Married      Spouse name: ray   Number of children: 3   Years of education: BS   Highest education level: Bachelor's degree (e.g., BA, AB, BS)  Occupational History      Comment: [employer], Resp Therapist  Tobacco Use   Smoking status: Never   Smokeless tobacco: Never  Vaping Use   Vaping Use: Never used  Substance and Sexual Activity   Alcohol use: Not Currently      Alcohol/week: 0.0 standard drinks of alcohol      Comment: rare   Drug use: No   Sexual activity: Yes      Birth control/protection: Injection  Other Topics Concern  Not on file  Social History Narrative    Lives in [place] with husband and children.  Not currently exercising.  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    Caffeine 2-3 cups daily    Social Determinants of Health    Financial Resource Strain: Not on file  Food Insecurity: Not on file  Transportation Needs: Not on file  Physical Activity: Not on file  Stress: Not on file  Social Connections: Not on file         Family History  Problem Relation Age of Onset   Diabetes Mother     Memory loss Mother     Arthritis Father     Other Father          atrial flutter   Emphysema Father          smoker   Diabetes Maternal Aunt     Stroke Paternal Aunt     Diabetes Maternal Uncle     Arthritis Maternal Grandmother     Diabetes Maternal Grandmother     Cancer Maternal Grandmother          skin   Arthritis Paternal Grandmother     Cancer Paternal Grandmother          skin         Allergies  Allergen Reactions   Zithromax [Azithromycin] Other (See Comments)       "convulsions or tremors"          Current Outpatient Medications  Medication Sig Dispense Refill   aspirin-acetaminophen-caffeine (EXCEDRIN MIGRAINE) 250-250-65 MG tablet Take by mouth daily.       clonazePAM (KLONOPIN) 0.5 MG tablet Take 0.5 mg by mouth 2 (two) times daily as needed.       influenza vac split quadrivalent PF (FLUARIX) 0.5 ML injection Inject into the muscle. 0.5 mL 0   medroxyPROGESTERone Acetate 150 MG/ML SUSY INJECT 1 ML INTO THE MUSCLE EVERY 3 MONTHS. 1 mL 3   Rimegepant Sulfate (NURTEC) 75 MG TBDP Take 75 mg by mouth every other day. 16 tablet 3   traMADol (ULTRAM) 50 MG tablet Take 1 tablet (50 mg total) by mouth every 12 (twelve) hours as needed for moderate pain or severe pain. 15 tablet 2    No current facility-administered medications for this visit.    Imaging Results (Last 48 hours)  No results found.     Review of Systems:   A ROS was performed including pertinent positives and negatives as documented in the HPI.   Physical Exam :   Constitutional: NAD and appears stated age Neurological: Alert and oriented Psych: Appropriate affect and cooperative There were no vitals taken for this visit.    Comprehensive Musculoskeletal Exam:     Tenderness palpation about lateral trochanter on the left.  Range of motion is 30 degrees internal/external rotation of the left hip with 100 degrees of left hip flexion with minimal pain.  There is positive Trendelenburg gait with walking.  Weakness with resisted hip abduction on the left.   With regard to the right knee she has a negative Lachman.  There is lateral joint line tenderness and swelling.  No varus or valgus laxity.  Range of motion is from 0 to 130 degrees.  Previous incisions are well-healed Imaging:   Xray (4 view right knee): There is calcification involving the lateral meniscus consistent with possible chondrocalcinosis   MRI left hip: There is edema as well as undersurface tearing of the gluteus medius  tendon of the left  hip without significant atrophy.  There is some retraction of the tendon   I personally reviewed and interpreted the radiographs.     Assessment:   42 y.o. female with left hip pain in the setting of a likely undersurface gluteus medius tear.  At this time she has trialed physical therapy as well as injections.  Her last gluteus medius injection gave her near complete relief for prolonged period of time.  At this time given the fact that this is worn off we did discuss options.  Specifically we have discussed surgical the nature open gluteus medius repair.  I did discuss limitations as well as rehab associated with this.  At this time she would like to proceed with this.   -Plan for left hip gluteus medius repair     After a lengthy discussion of treatment options, including risks, benefits, alternatives, complications of surgical and nonsurgical conservative options, the patient elected surgical repair.    The patient  is aware of the material risks  and complications including, but not limited to injury to adjacent structures, neurovascular injury, infection, numbness, bleeding, implant failure, thermal burns, stiffness, persistent pain, failure to heal, disease transmission from allograft, need for further surgery, dislocation, anesthetic risks, blood clots, risks of death,and others. The probabilities of surgical success and failure discussed with patient given their particular co-morbidities.The time and nature of expected rehabilitation and recovery was discussed.The patient's questions were all answered preoperatively.  No barriers to understanding were noted. I explained the natural history of the disease process and Rx rationale.  I explained to the patient what I considered to be reasonable expectations given their personal situation.  The final treatment plan was arrived at through a shared patient decision making process model.           I personally saw and  evaluated the patient, and participated in the management and treatment plan.   Vanetta Mulders, MD Attending Physician, Orthopedic Surgery   This document was dictated using Dragon voice recognition software. A reasonable attempt at proof reading has been made to minimize errors.

## 2022-05-23 NOTE — Brief Op Note (Signed)
   Brief Op Note  Date of Surgery: 05/23/2022  Preoperative Diagnosis: LEFT GLUTEUS MEDIUS TEAR  Postoperative Diagnosis: same  Procedure: Procedure(s): LEFT GLUTEUS MEDIUS REPAIR  Implants: Implant Name Type Inv. Item Serial No. Manufacturer Lot No. LRB No. Used Action  ANCHOR SUT JK SZ 2 2.9 DBL SL - [id number] Anchor ANCHOR SUT JK SZ 2 2.9 DBL SL  ZIMMER RECON(ORTH,TRAU,BIO,SG) [id number] Left 2 Implanted  ANCHOR SUT QUATTRO KNTLS 4.5 - [id number] Anchor ANCHOR SUT QUATTRO KNTLS 4.5  ZIMMER RECON(ORTH,TRAU,BIO,SG) [id number] Left 2 Implanted    Surgeons: Surgeon(s): Vanetta Mulders, MD  Anesthesia: General    Estimated Blood Loss: See anesthesia record  Complications: None  Condition to PACU: Stable  Yevonne Pax, MD 05/23/2022 8:44 AM

## 2022-05-23 NOTE — Discharge Instructions (Addendum)
Discharge Instructions    Attending Surgeon: Vanetta Mulders, MD Office Phone Number: [phone]   Diagnosis and Procedures:    Surgeries Performed: Left hip gluteus medius repair  Discharge Plan:    Diet: Resume usual diet. Begin with light or bland foods.  Drink plenty of fluids.  Activity:  Keep sling and dressing in place until your follow up visit in Physical Therapy You are advised to go home directly from the hospital or surgical center. Restrict your activities.  GENERAL INSTRUCTIONS: 1.  Keep your surgical site elevated above your heart for at least 5-7 days or longer to prevent swelling. This will improve your comfort and your overall recovery following surgery.     2. Please call Dr. Eddie Dibbles office at [phone] with questions Monday-Friday during business hours. If no one answers, please leave a message and someone should get back to the patient within 24 hours. For emergencies please call 911 or proceed to the emergency room.   3. Patient to notify surgical team if experiences any of the following: Bowel/Bladder dysfunction, uncontrolled pain, nerve/muscle weakness, incision with increased drainage or redness, nausea/vomiting and Fever greater than 101.0 F.  Be alert for signs of infection including redness, streaking, odor, fever or chills. Be alert for excessive pain or bleeding and notify your surgeon immediately.  WOUND INSTRUCTIONS:   Leave your dressing/cast/splint in place until your post operative visit.  Keep it clean and dry.  Always keep the incision clean and dry until the staples/sutures are removed. If there is no drainage from the incision you should keep it open to air. If there is drainage from the incision you must keep it covered at all times until the drainage stops  Do not soak in a bath tub, hot tub, pool, lake or other body of water until 21 days after your surgery and your incision is completely dry and healed.  If you have  removable sutures (or staples) they must be removed 10-14 days (unless otherwise instructed) from the day of your surgery.     1)  Elevate the extremity as much as possible.  2)  Keep the dressing clean and dry.  3)  Please call us if the dressing becomes wet or dirty.  4)  If you are experiencing worsening pain or worsening swelling, please call.     MEDICATIONS: Resume all previous home medications at the previous prescribed dose and frequency unless otherwise noted Start taking the  pain medications on an as-needed basis as prescribed  Please taper down pain medication over the next week following surgery.  Ideally you should not require a refill of any narcotic pain medication.  Take pain medication with food to minimize nausea. In addition to the prescribed pain medication, you may take over-the-counter pain relievers such as Tylenol.  Do NOT take additional tylenol if your pain medication already has tylenol in it.  Aspirin 347m daily for four weeks.      FOLLOWUP INSTRUCTIONS: 1. Follow up at the Physical Therapy Clinic 3-4 days following surgery. This appointment should be scheduled unless other arrangements have been made.The Physical Therapy scheduling number is [phone] an appointment has not already been arranged.  2. Contact Dr. BEddie Dibblesoffice during office hours at [phone] the practice after hours line at [phone] non-emergencies. For medical emergencies call 911.   Discharge Location: Home   May take Tylenol after 12:45 pm if needed.    Post Anesthesia Home Care Instructions  Activity:  Get plenty of rest for the remainder of the day. A responsible individual must stay with you for 24 hours following the procedure.  For the next 24 hours, DO NOT: -Drive a car -Paediatric nurse -Drink alcoholic beverages -Take any medication unless instructed by your physician -Make any legal decisions or sign important papers.  Meals: Start with  liquid foods such as gelatin or soup. Progress to regular foods as tolerated. Avoid greasy, spicy, heavy foods. If nausea and/or vomiting occur, drink only clear liquids until the nausea and/or vomiting subsides. Call your physician if vomiting continues.  Special Instructions/Symptoms: Your throat may feel dry or sore from the anesthesia or the breathing tube placed in your throat during surgery. If this causes discomfort, gargle with warm salt water. The discomfort should disappear within 24 hours.  If you had a scopolamine patch placed behind your ear for the management of post- operative nausea and/or vomiting:  1. The medication in the patch is effective for 72 hours, after which it should be removed.  Wrap patch in a tissue and discard in the trash. Wash hands thoroughly with soap and water. 2. You may remove the patch earlier than 72 hours if you experience unpleasant side effects which may include dry mouth, dizziness or visual disturbances. 3. Avoid touching the patch. Wash your hands with soap and water after contact with the patch.

## 2022-05-23 NOTE — Interval H&P Note (Signed)
History and Physical Interval Note:  05/23/2022 7:08 AM  Sarah Gallagher  has presented today for surgery, with the diagnosis of LEFT GLUTEUS MEDIUS TEAR.  The various methods of treatment have been discussed with the patient and family. After consideration of risks, benefits and other options for treatment, the patient has consented to  Procedure(s): LEFT GLUTEUS MEDIUS REPAIR (Left) as a surgical intervention.  The patient's history has been reviewed, patient examined, no change in status, stable for surgery.  I have reviewed the patient's chart and labs.  Questions were answered to the patient's satisfaction.     Vanetta Mulders

## 2022-05-23 NOTE — Anesthesia Procedure Notes (Signed)
Procedure Name: Intubation Date/Time: 05/23/2022 7:33 AM  Performed by: Lavonia Dana, CRNAPre-anesthesia Checklist: Patient identified, Emergency Drugs available, Suction available and Patient being monitored Patient Re-evaluated:Patient Re-evaluated prior to induction Oxygen Delivery Method: Circle system utilized Preoxygenation: Pre-oxygenation with 100% oxygen Induction Type: IV induction Ventilation: Mask ventilation without difficulty Laryngoscope Size: Mac and 3 Grade View: Grade I Tube type: Oral Tube size: 7.0 mm Number of attempts: 1 Airway Equipment and Method: Stylet and Bite block Placement Confirmation: ETT inserted through vocal cords under direct vision, positive ETCO2 and breath sounds checked- equal and bilateral Secured at: 22 cm Tube secured with: Tape Dental Injury: Teeth and Oropharynx as per pre-operative assessment

## 2022-05-23 NOTE — Anesthesia Postprocedure Evaluation (Signed)
Anesthesia Post Note  Patient: Sarah Gallagher  Procedure(s) Performed: LEFT GLUTEUS MEDIUS REPAIR (Left: Hip)     Patient location during evaluation: Phase II Anesthesia Type: General Level of consciousness: awake and sedated Pain management: pain level controlled Vital Signs Assessment: post-procedure vital signs reviewed and stable Respiratory status: spontaneous breathing Cardiovascular status: stable Postop Assessment: no apparent nausea or vomiting Anesthetic complications: no  No notable events documented.  Last Vitals:  Vitals:   05/23/22 0900 05/23/22 0915  BP: (!) 118/91 118/87  Pulse: 84 78  Resp: 13 (!) 24  Temp:    SpO2: 100% 98%    Last Pain:  Vitals:   05/23/22 0915  TempSrc:   PainSc: [place] Jr

## 2022-05-23 NOTE — Transfer of Care (Signed)
Immediate Anesthesia Transfer of Care Note  Patient: Sarah Gallagher  Procedure(s) Performed: LEFT GLUTEUS MEDIUS REPAIR (Left: Hip)  Patient Location: PACU  Anesthesia Type:General  Level of Consciousness: drowsy  Airway & Oxygen Therapy: Patient Spontanous Breathing and Patient connected to face mask oxygen  Post-op Assessment: Report given to RN and Post -op Vital signs reviewed and stable  Post vital signs: Reviewed and stable  Last Vitals:  Vitals Value Taken Time  BP 112/88 (97)   Temp    Pulse 109 05/23/22 0852  Resp 20 05/23/22 0852  SpO2 96 % 05/23/22 0852  Vitals shown include unvalidated device data.  Last Pain:  Vitals:   05/23/22 0641  TempSrc: Oral  PainSc: 0-No pain      Patients Stated Pain Goal: 4 ([phone] [id number])  Complications: No notable events documented.

## 2022-05-23 NOTE — Op Note (Signed)
   Date of Surgery: 05/23/2022  INDICATIONS: Ms. Scullin is a 42 y.o.-year-old female with a left hip gluteus medius undersurface tear which has failed conservative managemeng.  The risk and benefits of the procedure were discussed in detail and documented in the pre-operative evaluation.   PREOPERATIVE DIAGNOSIS: 1. Left hip gluteus medius tear, anterior border  POSTOPERATIVE DIAGNOSIS: Same.  PROCEDURE: 1. Left hip gluteus medius repair  SURGEON: Yevonne Pax MD  ASSISTANT: Raynelle Fanning, ATC  ANESTHESIA:  general  IV FLUIDS AND URINE: See anesthesia record.  ANTIBIOTICS: Ancef  ESTIMATED BLOOD LOSS: 10 mL.  IMPLANTS:  Implant Name Type Inv. Item Serial No. Manufacturer Lot No. LRB No. Used Action  ANCHOR SUT JK SZ 2 2.9 DBL SL - [id number] Anchor ANCHOR SUT JK SZ 2 2.9 DBL SL  ZIMMER RECON(ORTH,TRAU,BIO,SG) [id number] Left 2 Implanted  ANCHOR SUT QUATTRO KNTLS 4.5 - [id number] Anchor ANCHOR SUT QUATTRO KNTLS 4.5  ZIMMER RECON(ORTH,TRAU,BIO,SG) [id number] Left 2 Implanted    DRAINS: None  CULTURES: None  COMPLICATIONS: none  DESCRIPTION OF PROCEDURE:  The patient was identified in the preoperative holding area.  The correct site was marked and confirmed according to nursing.  The patient was subsequently taken back to the operating room.  Antibiotics were given 1 hour prior to incision.  Anesthesia was induced.  He was placed in the lateral position with a beanbag positioner with care to pad the down leg and peroneal nerve.   Patient was prepped and draped in the usual sterile fashion.  Again timeout was performed confirming the correct side.  An approach was made over the lateral aspect of the greater trochanter.  Dissection was initially carried down sharply with 15 blade and electrocautery was used to perform hemostasis.  A 15 blade was used to make a nick in the IT band which was completed with Mayo scissors.  At this point we encountered the gluteus medius tendon. There was  overlying bursa was dissected out sharply with Metzenbaum scissors and removed. This was extremely thin and then completely torn.  This was debrided and the trochanteric lateral posterior facet was prepared using a Cobb.  We then mobilized the gluteus medius abductor muscles with an Allis clamp.  Excess bursal tissue was excised sharply with Mayo scissors.  A double row type configuration was then utilized with 2 Medial Row all suture anchors (a total of 8 limbs).  These were placed through the tissue and then brought to 2 Lateral Row Quattro anchors.  This produced an anatomic footprint restoration of the gluteus tendon.   The wounds were thoroughly irrigated.  We closed in layers of 0 Vicryl for the IT band, 2-0 Vicryl, and staples for skin. The patient was awoken and taken to the PACU.  All counts were correct at the end of the case and no complications.       POSTOPERATIVE PLAN: She will be touchdown weightbearing for a total of 2 weeks.  She will be placed on aspirin for blood clot prevention.  She will be seen initially with an physical therapy for the gluteus medius repair protocol.  I will see her back in 2 weeks for wound check.  Yevonne Pax, MD 8:51 AM

## 2022-05-24 ENCOUNTER — Encounter (HOSPITAL_BASED_OUTPATIENT_CLINIC_OR_DEPARTMENT_OTHER): Payer: Self-pay | Admitting: Orthopaedic Surgery

## 2022-05-26 ENCOUNTER — Encounter (HOSPITAL_BASED_OUTPATIENT_CLINIC_OR_DEPARTMENT_OTHER): Payer: Self-pay | Admitting: Physical Therapy

## 2022-05-26 ENCOUNTER — Ambulatory Visit (HOSPITAL_BASED_OUTPATIENT_CLINIC_OR_DEPARTMENT_OTHER): Payer: 59 | Attending: Orthopaedic Surgery | Admitting: Physical Therapy

## 2022-05-26 DIAGNOSIS — M67959 Unspecified disorder of synovium and tendon, unspecified thigh: Secondary | ICD-10-CM | POA: Diagnosis not present

## 2022-05-26 DIAGNOSIS — M25552 Pain in left hip: Secondary | ICD-10-CM | POA: Insufficient documentation

## 2022-05-26 DIAGNOSIS — R2689 Other abnormalities of gait and mobility: Secondary | ICD-10-CM | POA: Diagnosis not present

## 2022-05-26 DIAGNOSIS — M25652 Stiffness of left hip, not elsewhere classified: Secondary | ICD-10-CM | POA: Insufficient documentation

## 2022-05-26 NOTE — Therapy (Signed)
OUTPATIENT PHYSICAL THERAPY LOWER EXTREMITY EVALUATION   Patient Name: Canovanas CONTRERAZ MRN: [id number] DOB:1980/06/28, 42 y.o., female Today's Date: 05/26/2022  END OF SESSION:   Past Medical History:  Diagnosis Date   Anxiety    Arthritis    Chest discomfort    a. associated with palpitations.   Depression    Dyspnea on exertion    a. 12/2016 Echo: EF 55-60%, no rwma, nl PASP.   Juvenile rheumatoid arthritis (HCC)    Meniere disease    Migraines    Neuropathy    Palpitations    PONV (postoperative nausea and vomiting)    PVC's (premature ventricular contractions)    a. 01/2017 48h Holter: Rare PAC/PVC. Freq runs of sinus tachycardia.   Past Surgical History:  Procedure Laterality Date   GLUTEUS MINIMUS REPAIR Left 05/23/2022   Procedure: LEFT GLUTEUS MEDIUS REPAIR;  Surgeon: Vanetta Mulders, MD;  Location: [facility];  Service: Orthopedics;  Laterality: Left;   KNEE SURGERY Right 1997, 1997   torn meniscus, ACL replacement     REFRACTIVE SURGERY     lasix   Patient Active Problem List   Diagnosis Date Noted   Tendinopathy of gluteus medius 05/23/2022   Encounter for general adult medical examination with abnormal findings 11/30/2021   Attention and concentration deficit 10/18/2021   Tinnitus of both ears 08/25/2021   Imbalance 08/25/2021   Abnormal auditory perception of both ears 08/25/2021   Migraine 10/29/2020   Overweight 10/29/2020   Dysplasia of cervix, low grade (CIN 1) 10/16/2018   Chiari I malformation ([facility]) 10/16/2018   Cervical radiculopathy 09/06/2018   Chronic hip pain, left 09/06/2018   Iron deficiency 09/06/2018   Anxiety disorder 11/27/2017    PCP: Agnes Lawrence MD   REFERRING PROVIDER: Dr Vanetta Mulders  REFERRING DIAG: L glut med repair   THERAPY DIAG:  No diagnosis found.  Rationale for Evaluation and Treatment: Rehabilitation  ONSET DATE: 05/23/2022  SUBJECTIVE:   SUBJECTIVE STATEMENT: Patient is a 65-year history  of left hip pain.  The pain is interfering with her ability to work out and walk distances.  He was also affecting her ability to do certain work tasks.  She was found to have a glut medius tear.  She had this repaired on 05/23/2022.  At this time she is touchdown weightbearing and walking with a rollator walker.  She reports her pain is well-controlled unless she sits for a longer period of time  PERTINENT HISTORY: Juvenile RA right ACL tear and repair 1997, migraines, depression PAIN:  Are you having pain? Yes: NPRS scale: 6/10 right now 8/10 at worst  Pain location: left anterior hip  Pain description: pain can radiate down  Aggravating factors: being upright Relieving factors: rest and propping it up; pain meds  PRECAUTIONS: Other: protocol limitations   WEIGHT BEARING RESTRICTIONS: Yes TDWB for 2 weeks   FALLS:  Has patient fallen in last 6 months? No  LIVING ENVIRONMENT: 5 steps into the house OCCUPATION:  Works in the [employer]: get back into the gym Lift weights   PLOF: Independent  PATIENT GOALS:   To be able to do activity with less pain   NEXT MD VISIT:  2 weeks   OBJECTIVE:   DIAGNOSTIC FINDINGS:  Nothing post op    PATIENT SURVEYS:    COGNITION: Overall cognitive status: Within functional limits for tasks assessed     SENSATION: A little numb on the back side of the incision; pain radiating  into her knee and ankle  EDEMA:    MUSCLE LENGTH:  POSTURE: No Significant postural limitations  PALPATION: No unexpected tenderness to palpation   LOWER EXTREMITY ROM:  Passive ROM Right eval Left eval  Hip flexion    Hip extension    Hip abduction    Hip adduction    Hip internal rotation    Hip external rotation    Knee flexion    Knee extension    Ankle dorsiflexion    Ankle plantarflexion    Ankle inversion    Ankle eversion     (Blank rows = not tested)  LOWER EXTREMITY MMT:  MMT Right eval Left eval  Hip flexion  90  Hip  extension    Hip abduction  10  Hip adduction  N/A  Hip internal rotation  5  Hip external rotation  N/A  Knee flexion    Knee extension    Ankle dorsiflexion    Ankle plantarflexion    Ankle inversion    Ankle eversion     (Blank rows = not tested) not tested 2nd to recent surgery   FUNCTIONAL TESTS:  Using chair to press up  GAIT: Using rollator walker. Appears to be performing TDWB but was educated on the importance of maintaining that for the first 2 weeks.   TODAY'S TREATMENT:                                                                                                                              DATE: Access Code: [email or web address] URL: [email or web address] Date: 05/26/2022 Prepared by: Carolyne Littles  Exercises - Supine Quad Set  - 5 x daily - 7 x weekly - 3 sets - 10 reps - Seated Ankle Pumps  - 5 x daily - 7 x weekly - 3 sets - 10 reps   PATIENT EDUCATION:  Education details: HEP, symptom management; importance of weight bearing.  Person educated: Patient Education method: Explanation, Demonstration, Tactile cues, Verbal cues, and Handouts Education comprehension: verbalized understanding, returned demonstration, verbal cues required, and tactile cues required  HOME EXERCISE PROGRAM: Access Code: [facility] URL: [email or web address] Date: 05/26/2022 Prepared by: Carolyne Littles  Exercises - Supine Quad Set  - 5 x daily - 7 x weekly - 3 sets - 10 reps - Seated Ankle Pumps  - 5 x daily - 7 x weekly - 3 sets - 10 reps  ASSESSMENT:  CLINICAL IMPRESSION: The patient is a 42 year old female S/P L glut med repair.  Patient presents with expected limitations in range of motion strength and function.  Patient will like to get back into an exercise program for general health.  She would benefit from skilled therapy to improve ability to ambulate in the community, return to work, and return to the gym. OBJECTIVE IMPAIRMENTS: Abnormal gait, decreased  activity tolerance, decreased endurance, decreased mobility, difficulty walking, decreased ROM, decreased strength, and pain.   ACTIVITY LIMITATIONS: carrying, lifting, bending, sitting, standing,  squatting, sleeping, stairs, transfers, bed mobility, and locomotion level  PARTICIPATION LIMITATIONS: meal prep, cleaning, laundry, driving, shopping, community activity, occupation, and yard work  PERSONAL FACTORS: 1-2 comorbidities: has had a nerupathy like condition in the past  are also affecting patient's functional outcome.   REHAB POTENTIAL: Excellent  CLINICAL DECISION MAKING: Stable/uncomplicated  EVALUATION COMPLEXITY: Low   GOALS: Goals reviewed with patient? Yes  SHORT TERM GOALS: Target date: 06/23/2022   Patient will be independent with basic exercise program Baseline: Goal status: INITIAL  2.  Patient will be weightbearing as tolerated with walker and progressed off walker as tolerated baseline:  Goal status: INITIAL  3.  Patient will perform all bed mobility independently Baseline:  Goal status: INITIAL LONG TERM GOALS: Target date: 07/21/2022    Patient will go up and down 5 steps with reciprocal gait in order to get in and out of her house Baseline:  Goal status: INITIAL  2.  Patient will ambulate 3000 feet with no pain or assistive device in order to go shopping Baseline:  Goal status: INITIAL  3.  Patient will stand for 1 hour without self-report of pain in order to return to work Baseline:  Goal status: INITIAL  PLAN:  PT FREQUENCY: 2x/week  PT DURATION: 8 weeks  PLANNED INTERVENTIONS: Therapeutic exercises, Therapeutic activity, Neuromuscular re-education, Balance training, Gait training, Patient/Family education, Self Care, Joint mobilization, Stair training, Aquatic Therapy, Dry Needling, Cryotherapy, Moist heat, Taping, Ultrasound, Manual therapy, and Re-evaluation  PLAN FOR NEXT SESSION: begin per protocol for glut med repair    Carney Living, PT 05/26/2022, 8:07 AM

## 2022-06-01 ENCOUNTER — Encounter: Payer: Self-pay | Admitting: Internal Medicine

## 2022-06-01 ENCOUNTER — Ambulatory Visit (INDEPENDENT_AMBULATORY_CARE_PROVIDER_SITE_OTHER): Payer: 59 | Admitting: Internal Medicine

## 2022-06-01 ENCOUNTER — Encounter: Payer: Self-pay | Admitting: Radiology

## 2022-06-01 VITALS — BP 119/72 | HR 94 | Ht 66.0 in | Wt 191.6 lb

## 2022-06-01 DIAGNOSIS — G43009 Migraine without aura, not intractable, without status migrainosus: Secondary | ICD-10-CM | POA: Diagnosis not present

## 2022-06-01 DIAGNOSIS — E669 Obesity, unspecified: Secondary | ICD-10-CM | POA: Diagnosis not present

## 2022-06-01 DIAGNOSIS — M67959 Unspecified disorder of synovium and tendon, unspecified thigh: Secondary | ICD-10-CM

## 2022-06-01 DIAGNOSIS — F411 Generalized anxiety disorder: Secondary | ICD-10-CM | POA: Diagnosis not present

## 2022-06-01 DIAGNOSIS — R41840 Attention and concentration deficit: Secondary | ICD-10-CM | POA: Diagnosis not present

## 2022-06-01 NOTE — Assessment & Plan Note (Addendum)
Was on Phentermine, but had to stop due to palpitations Had lost about 14 lbs since starting in 11/2020 Recent weight gain likely due to lack of mobility Advised to continue to follow low-carb diet

## 2022-06-01 NOTE — Assessment & Plan Note (Signed)
Well controlled with Nurtec as needed

## 2022-06-01 NOTE — Assessment & Plan Note (Addendum)
Had neuropsychiatric evaluation recently According to last note of psychology, she needs further evaluation and will be discussed with psychiatrist With confirmed diagnosis, plan to start Adderall

## 2022-06-01 NOTE — Assessment & Plan Note (Signed)
Recently had gluteus medius repair surgery Undergoing PT Followed by orthopedic surgeon

## 2022-06-01 NOTE — Assessment & Plan Note (Signed)
She had concern for ADHD Referred to psychiatry - had neuropsych evaluation She has not tolerated multiple antidepressants in the past, including Lexapro, Effexor, BuSpar etc. She prefers to continue relaxation techniques at home for now.

## 2022-06-01 NOTE — Patient Instructions (Signed)
Please continue taking medications as prescribed.  Please continue to follow low carb diet and ambulate as tolerated. 

## 2022-06-01 NOTE — Progress Notes (Signed)
Established Patient Office Visit  Subjective:  Patient ID: Sarah Gallagher, female    DOB: [birth year]  Age: 42 y.o. MRN: [id number]  CC:  Chief Complaint  Patient presents with   Follow-up    Follow up    HPI Sarah Gallagher is a 42 y.o. female with past medical history of migraine, chronic hip pain and depression with anxiety who presents for f/u of her chronic medical conditions.  She recently had left gluteus medius repair surgery.  She is undergoing PT. She is using walker for support currently.  She has mild tingling in the left buttock area.  Denies any numbness or tingling of the foot.  She still has spells of anxiety and decreased concentration.  She has seen psychiatrist for evaluation of anxiety and possible ADD.  She was referred for neuropsych evaluation. Her sons are on Focalin for ADD currently.   Past Medical History:  Diagnosis Date   Anxiety    Arthritis    Chest discomfort    a. associated with palpitations.   Depression    Dyspnea on exertion    a. 12/2016 Echo: EF 55-60%, no rwma, nl PASP.   Juvenile rheumatoid arthritis (HCC)    Meniere disease    Migraines    Neuropathy    Palpitations    PONV (postoperative nausea and vomiting)    PVC's (premature ventricular contractions)    a. 01/2017 48h Holter: Rare PAC/PVC. Freq runs of sinus tachycardia.    Past Surgical History:  Procedure Laterality Date   GLUTEUS MINIMUS REPAIR Left 05/23/2022   Procedure: LEFT GLUTEUS MEDIUS REPAIR;  Surgeon: Vanetta Mulders, MD;  Location: [facility];  Service: Orthopedics;  Laterality: Left;   KNEE SURGERY Right 1997, 1997   torn meniscus, ACL replacement     REFRACTIVE SURGERY     lasix    Family History  Problem Relation Age of Onset   Diabetes Mother    Memory loss Mother    Arthritis Father    Other Father        atrial flutter   Emphysema Father        smoker   Diabetes Maternal Aunt    Stroke Paternal Aunt    Diabetes Maternal Uncle     Arthritis Maternal Grandmother    Diabetes Maternal Grandmother    Cancer Maternal Grandmother        skin   Arthritis Paternal Grandmother    Cancer Paternal Grandmother        skin    Social History   Socioeconomic History   Marital status: Married    Spouse name: ray   Number of children: 3   Years of education: BS   Highest education level: Bachelor's degree (e.g., BA, AB, BS)  Occupational History    Comment: [employer], Resp Therapist  Tobacco Use   Smoking status: Never   Smokeless tobacco: Never  Vaping Use   Vaping Use: Never used  Substance and Sexual Activity   Alcohol use: Not Currently    Alcohol/week: 0.0 standard drinks of alcohol    Comment: rare   Drug use: No   Sexual activity: Yes    Birth control/protection: Injection  Other Topics Concern   Not on file  Social History Narrative   Lives in [place] with husband and children.  Not currently exercising.  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   Caffeine 2-3 cups daily   Social Determinants of Radio broadcast assistant  Strain: Not on file  Food Insecurity: Not on file  Transportation Needs: Not on file  Physical Activity: Not on file  Stress: Not on file  Social Connections: Not on file  Intimate Partner Violence: Not on file    Outpatient Medications Prior to Visit  Medication Sig Dispense Refill   aspirin EC 325 MG tablet Take 1 tablet (325 mg total) by mouth daily. 30 tablet 0   aspirin-acetaminophen-caffeine (EXCEDRIN MIGRAINE) [id number] MG tablet Take by mouth daily.     clonazePAM (KLONOPIN) 0.5 MG tablet Take 0.5 mg by mouth 2 (two) times daily as needed.     medroxyPROGESTERone Acetate 150 MG/ML SUSY INJECT 1 ML INTO THE MUSCLE EVERY 3 MONTHS. 1 mL 3   oxyCODONE (OXY IR/ROXICODONE) 5 MG immediate release tablet Take 1 tablet (5 mg total) by mouth every 4 (four) hours as needed (severe pain). 20 tablet 0   Rimegepant Sulfate (NURTEC) 75 MG TBDP Take 75 mg by mouth every other day. 16 tablet  3   traMADol (ULTRAM) 50 MG tablet Take 1 tablet (50 mg total) by mouth every 12 (twelve) hours as needed for moderate pain or severe pain. 15 tablet 2   influenza vac split quadrivalent PF (FLUARIX) 0.5 ML injection Inject into the muscle. 0.5 mL 0   No facility-administered medications prior to visit.    Allergies  Allergen Reactions   Zithromax [Azithromycin] Other (See Comments)    "convulsions or tremors"    ROS Review of Systems  Constitutional:  Negative for chills and fever.  HENT:  Negative for congestion, sinus pressure, sinus pain and sore throat.   Eyes:  Negative for pain and discharge.  Respiratory:  Negative for cough and shortness of breath.   Cardiovascular:  Negative for chest pain and palpitations.  Gastrointestinal:  Negative for abdominal pain, diarrhea, nausea and vomiting.  Endocrine: Negative for polydipsia and polyuria.  Genitourinary:  Negative for dysuria and hematuria.  Musculoskeletal:  Positive for arthralgias (L hip and knee). Negative for neck pain and neck stiffness.  Skin:  Negative for rash.  Neurological:  Positive for headaches. Negative for dizziness and weakness.  Psychiatric/Behavioral:  Negative for agitation and behavioral problems. The patient is nervous/anxious. The patient is not hyperactive.       Objective:    Physical Exam Vitals reviewed.  Constitutional:      General: She is not in acute distress.    Appearance: She is not diaphoretic.     Comments: Has a rolling walker  HENT:     Head: Normocephalic and atraumatic.     Nose: Nose normal. No congestion.     Mouth/Throat:     Mouth: Mucous membranes are moist.     Pharynx: No posterior oropharyngeal erythema.  Eyes:     General: No scleral icterus.    Extraocular Movements: Extraocular movements intact.  Cardiovascular:     Rate and Rhythm: Normal rate and regular rhythm.     Heart sounds: Normal heart sounds. No murmur heard. Pulmonary:     Breath sounds: Normal  breath sounds. No wheezing or rales.  Musculoskeletal:     Cervical back: Neck supple. No tenderness.     Right lower leg: No edema.     Left lower leg: No edema.  Skin:    General: Skin is warm.     Findings: No rash.  Neurological:     General: No focal deficit present.     Mental Status: She is alert and oriented to  person, place, and time.  Psychiatric:        Mood and Affect: Mood normal.        Behavior: Behavior normal.     BP 119/72 (BP Location: Left Arm, Patient Position: Sitting, Cuff Size: Normal)   Pulse 94   Ht '5\' 6"'$  (1.676 m)   Wt 191 lb 9.6 oz (86.9 kg)   SpO2 98%   BMI 30.93 kg/m  Wt Readings from Last 3 Encounters:  06/01/22 191 lb 9.6 oz (86.9 kg)  05/23/22 189 lb 9.5 oz (86 kg)  11/29/21 180 lb 6.4 oz (81.8 kg)    Lab Results  Component Value Date   TSH 1.990 11/23/2021   Lab Results  Component Value Date   WBC 6.2 11/23/2021   HGB 13.0 11/23/2021   HCT 39.8 11/23/2021   MCV 92 11/23/2021   PLT 184 11/23/2021   Lab Results  Component Value Date   NA 143 11/23/2021   K 4.4 11/23/2021   CO2 21 11/23/2021   GLUCOSE 84 11/23/2021   BUN 15 11/23/2021   CREATININE 0.95 11/23/2021   BILITOT 0.7 11/23/2021   ALKPHOS 68 11/23/2021   AST 17 11/23/2021   ALT 15 11/23/2021   PROT 6.5 11/23/2021   ALBUMIN 4.3 11/23/2021   CALCIUM 9.4 11/23/2021   ANIONGAP 11 05/23/2019   EGFR 77 11/23/2021   GFR 62.94 07/12/2017   Lab Results  Component Value Date   CHOL 125 11/23/2021   Lab Results  Component Value Date   HDL 38 (L) 11/23/2021   Lab Results  Component Value Date   LDLCALC 77 11/23/2021   Lab Results  Component Value Date   TRIG 42 11/23/2021   Lab Results  Component Value Date   CHOLHDL 3.3 11/23/2021   Lab Results  Component Value Date   HGBA1C 5.6 11/23/2021      Assessment & Plan:   Problem List Items Addressed This Visit       Cardiovascular and Mediastinum   Migraine    Well controlled with Nurtec as needed         Musculoskeletal and Integument   Tendinopathy of gluteus medius    Recently had gluteus medius repair surgery Undergoing PT Followed by orthopedic surgeon        Other   Anxiety disorder    She had concern for ADHD Referred to psychiatry - had neuropsych evaluation She has not tolerated multiple antidepressants in the past, including Lexapro, Effexor, BuSpar etc. She prefers to continue relaxation techniques at home for now.      Obesity (BMI 30-39.9) - Primary    Was on Phentermine, but had to stop due to palpitations Had lost about 14 lbs since starting in 11/2020 Recent weight gain likely due to lack of mobility Advised to continue to follow low-carb diet      Attention and concentration deficit    Had neuropsychiatric evaluation recently According to last note of psychology, she needs further evaluation and will be discussed with psychiatrist With confirmed diagnosis, plan to start Adderall       No orders of the defined types were placed in this encounter.   Follow-up: Return in about 4 months (around 09/30/2022) for ADD.    Lindell Spar, MD

## 2022-06-02 ENCOUNTER — Ambulatory Visit (HOSPITAL_BASED_OUTPATIENT_CLINIC_OR_DEPARTMENT_OTHER): Payer: 59 | Attending: Orthopaedic Surgery | Admitting: Physical Therapy

## 2022-06-02 ENCOUNTER — Encounter (HOSPITAL_BASED_OUTPATIENT_CLINIC_OR_DEPARTMENT_OTHER): Payer: Self-pay | Admitting: Physical Therapy

## 2022-06-02 DIAGNOSIS — M25552 Pain in left hip: Secondary | ICD-10-CM | POA: Diagnosis not present

## 2022-06-02 DIAGNOSIS — R2689 Other abnormalities of gait and mobility: Secondary | ICD-10-CM | POA: Insufficient documentation

## 2022-06-02 DIAGNOSIS — M25652 Stiffness of left hip, not elsewhere classified: Secondary | ICD-10-CM

## 2022-06-02 NOTE — Therapy (Signed)
OUTPATIENT PHYSICAL THERAPY LOWER EXTREMITY Treatment    Patient Name: Sarah Gallagher MRN: [id number] DOB:03/23/1981, 42 y.o., female Today's Date: 06/02/2022  END OF SESSION:  PT End of Session - 06/02/22 0816     Visit Number 2    Number of Visits 16    Date for PT Re-Evaluation 07/21/22    PT Start Time 0800    PT Stop Time 0838    PT Time Calculation (min) 38 min    Activity Tolerance Patient tolerated treatment well    Behavior During Therapy [facility] for tasks assessed/performed             Past Medical History:  Diagnosis Date   Anxiety    Arthritis    Chest discomfort    a. associated with palpitations.   Depression    Dyspnea on exertion    a. 12/2016 Echo: EF 55-60%, no rwma, nl PASP.   Juvenile rheumatoid arthritis (HCC)    Meniere disease    Migraines    Neuropathy    Palpitations    PONV (postoperative nausea and vomiting)    PVC's (premature ventricular contractions)    a. 01/2017 48h Holter: Rare PAC/PVC. Freq runs of sinus tachycardia.   Past Surgical History:  Procedure Laterality Date   GLUTEUS MINIMUS REPAIR Left 05/23/2022   Procedure: LEFT GLUTEUS MEDIUS REPAIR;  Surgeon: Vanetta Mulders, MD;  Location: [place];  Service: Orthopedics;  Laterality: Left;   KNEE SURGERY Right 1997, 1997   torn meniscus, ACL replacement     REFRACTIVE SURGERY     lasix   Patient Active Problem List   Diagnosis Date Noted   Tendinopathy of gluteus medius 05/23/2022   Encounter for general adult medical examination with abnormal findings 11/30/2021   Attention and concentration deficit 10/18/2021   Tinnitus of both ears 08/25/2021   Imbalance 08/25/2021   Abnormal auditory perception of both ears 08/25/2021   Migraine 10/29/2020   Obesity (BMI 30-39.9) 10/29/2020   Dysplasia of cervix, low grade (CIN 1) 10/16/2018   Chiari I malformation ([place]) 10/16/2018   Cervical radiculopathy 09/06/2018   Chronic hip pain, left 09/06/2018   Iron  deficiency 09/06/2018   Anxiety disorder 11/27/2017    PCP: Agnes Lawrence MD   REFERRING PROVIDER: Dr Vanetta Mulders  REFERRING DIAG: L glut med repair   THERAPY DIAG:  Pain in left hip  Stiffness of left hip, not elsewhere classified  Other abnormalities of gait and mobility  Rationale for Evaluation and Treatment: Rehabilitation  ONSET DATE: 05/23/2022  SUBJECTIVE:   SUBJECTIVE STATEMENT: The patient reports general pain down her leg into her ankle. She feels like it isworse when she is up.  PERTINENT HISTORY: Juvenile RA right ACL tear and repair 1997, migraines, depression PAIN:  Are you having pain? Yes: NPRS scale: 3/10 today  Pain location: left anterior hip  Pain description: pain can radiate down  Aggravating factors: being upright Relieving factors: rest and propping it up; pain meds  PRECAUTIONS: Other: protocol limitations   WEIGHT BEARING RESTRICTIONS: Yes TDWB for 2 weeks   FALLS:  Has patient fallen in last 6 months? No  LIVING ENVIRONMENT: 5 steps into the house OCCUPATION:  Works in the [employer]: get back into the gym Lift weights   PLOF: Independent  PATIENT GOALS:   To be able to do activity with less pain   NEXT MD VISIT:  2 weeks   OBJECTIVE:   DIAGNOSTIC FINDINGS:  Nothing post op  PATIENT SURVEYS:    COGNITION: Overall cognitive status: Within functional limits for tasks assessed     SENSATION: A little numb on the back side of the incision; pain radiating into her knee and ankle  EDEMA:    MUSCLE LENGTH:  POSTURE: No Significant postural limitations  PALPATION: No unexpected tenderness to palpation   LOWER EXTREMITY ROM:  Passive ROM Right eval Left eval  Hip flexion    Hip extension    Hip abduction    Hip adduction    Hip internal rotation    Hip external rotation    Knee flexion    Knee extension    Ankle dorsiflexion    Ankle plantarflexion    Ankle inversion    Ankle eversion     (Blank  rows = not tested)  LOWER EXTREMITY MMT:  MMT Right eval Left eval  Hip flexion  90  Hip extension    Hip abduction  10  Hip adduction  N/A  Hip internal rotation  5  Hip external rotation  N/A  Knee flexion    Knee extension    Ankle dorsiflexion    Ankle plantarflexion    Ankle inversion    Ankle eversion     (Blank rows = not tested) not tested 2nd to recent surgery   FUNCTIONAL TESTS:  Using chair to press up  GAIT: Using rollator walker. Appears to be performing TDWB but was educated on the importance of maintaining that for the first 2 weeks.   TODAY'S TREATMENT:                                                                                                                              DATE: PROM into protocol limits. Patient easily reached her protocol limits. Trigger point release and roller to lateral inferior quad   Qaud set 2x20  Hip extension Iso 2x15  Hamstring iso 2x15  Hip adduction isometric 2x20  PPT x20      Eval:  Access Code: [email or web address] URL: [email or web address] Date: 05/26/2022 Prepared by: Carolyne Littles  Exercises - Supine Quad Set  - 5 x daily - 7 x weekly - 3 sets - 10 reps - Seated Ankle Pumps  - 5 x daily - 7 x weekly - 3 sets - 10 reps   PATIENT EDUCATION:  Education details: HEP, symptom management; importance of weight bearing.  Person educated: Patient Education method: Explanation, Demonstration, Tactile cues, Verbal cues, and Handouts Education comprehension: verbalized understanding, returned demonstration, verbal cues required, and tactile cues required  HOME EXERCISE PROGRAM: Access Code: [facility] URL: [email or web address] Date: 05/26/2022 Prepared by: Carolyne Littles  Exercises - Supine Quad Set  - 5 x daily - 7 x weekly - 3 sets - 10 reps - Seated Ankle Pumps  - 5 x daily - 7 x weekly - 3 sets - 10 reps  ASSESSMENT:  CLINICAL IMPRESSION: The patient tolerated treatment well. Her ROM is  past the protocol  limits without any pain. She had a mild trigger point in her quad that we worked on today. It may be a pain generator for the pain down her leg. We will continue to work on it and monitor. We expanded her HEP per protocol.   OBJECTIVE IMPAIRMENTS: Abnormal gait, decreased activity tolerance, decreased endurance, decreased mobility, difficulty walking, decreased ROM, decreased strength, and pain.   ACTIVITY LIMITATIONS: carrying, lifting, bending, sitting, standing, squatting, sleeping, stairs, transfers, bed mobility, and locomotion level  PARTICIPATION LIMITATIONS: meal prep, cleaning, laundry, driving, shopping, community activity, occupation, and yard work  PERSONAL FACTORS: 1-2 comorbidities: has had a nerupathy like condition in the past  are also affecting patient's functional outcome.   REHAB POTENTIAL: Excellent  CLINICAL DECISION MAKING: Stable/uncomplicated  EVALUATION COMPLEXITY: Low   GOALS: Goals reviewed with patient? Yes  SHORT TERM GOALS: Target date: 06/23/2022   Patient will be independent with basic exercise program Baseline: Goal status: INITIAL  2.  Patient will be weightbearing as tolerated with walker and progressed off walker as tolerated baseline:  Goal status: INITIAL  3.  Patient will perform all bed mobility independently Baseline:  Goal status: INITIAL LONG TERM GOALS: Target date: 07/21/2022    Patient will go up and down 5 steps with reciprocal gait in order to get in and out of her house Baseline:  Goal status: INITIAL  2.  Patient will ambulate 3000 feet with no pain or assistive device in order to go shopping Baseline:  Goal status: INITIAL  3.  Patient will stand for 1 hour without self-report of pain in order to return to work Baseline:  Goal status: INITIAL  PLAN:  PT FREQUENCY: 2x/week  PT DURATION: 8 weeks  PLANNED INTERVENTIONS: Therapeutic exercises, Therapeutic activity, Neuromuscular re-education,  Balance training, Gait training, Patient/Family education, Self Care, Joint mobilization, Stair training, Aquatic Therapy, Dry Needling, Cryotherapy, Moist heat, Taping, Ultrasound, Manual therapy, and Re-evaluation  PLAN FOR NEXT SESSION: begin per protocol for glut med repair    Carney Living, PT 06/02/2022, 11:18 AM

## 2022-06-07 ENCOUNTER — Ambulatory Visit (HOSPITAL_BASED_OUTPATIENT_CLINIC_OR_DEPARTMENT_OTHER): Payer: 59 | Admitting: Orthopaedic Surgery

## 2022-06-07 ENCOUNTER — Ambulatory Visit (HOSPITAL_BASED_OUTPATIENT_CLINIC_OR_DEPARTMENT_OTHER): Payer: 59 | Admitting: Physical Therapy

## 2022-06-07 DIAGNOSIS — M25552 Pain in left hip: Secondary | ICD-10-CM | POA: Diagnosis not present

## 2022-06-07 DIAGNOSIS — R2689 Other abnormalities of gait and mobility: Secondary | ICD-10-CM | POA: Diagnosis not present

## 2022-06-07 DIAGNOSIS — M25652 Stiffness of left hip, not elsewhere classified: Secondary | ICD-10-CM

## 2022-06-07 DIAGNOSIS — M67959 Unspecified disorder of synovium and tendon, unspecified thigh: Secondary | ICD-10-CM

## 2022-06-07 NOTE — Progress Notes (Signed)
Post Operative Evaluation    Procedure/Date of Surgery: Left hip gluteus medius repair 05/23/22  Interval History:   Presents today 2 weeks status post above procedure.  Overall she is doing well.  She states that she is now able to stand up straighter with relief of her back issues.  She has been working well with physical therapy.  She is compliant with touchdown weightbearing.   PMH/PSH/Family History/Social History/Meds/Allergies:    Past Medical History:  Diagnosis Date   Anxiety    Arthritis    Chest discomfort    a. associated with palpitations.   Depression    Dyspnea on exertion    a. 12/2016 Echo: EF 55-60%, no rwma, nl PASP.   Juvenile rheumatoid arthritis (HCC)    Meniere disease    Migraines    Neuropathy    Palpitations    PONV (postoperative nausea and vomiting)    PVC's (premature ventricular contractions)    a. 01/2017 48h Holter: Rare PAC/PVC. Freq runs of sinus tachycardia.   Past Surgical History:  Procedure Laterality Date   GLUTEUS MINIMUS REPAIR Left 05/23/2022   Procedure: LEFT GLUTEUS MEDIUS REPAIR;  Surgeon: Vanetta Mulders, MD;  Location: [place];  Service: Orthopedics;  Laterality: Left;   KNEE SURGERY Right 1997, 1997   torn meniscus, ACL replacement     REFRACTIVE SURGERY     lasix   Social History   Socioeconomic History   Marital status: Married    Spouse name: ray   Number of children: 3   Years of education: BS   Highest education level: Bachelor's degree (e.g., BA, AB, BS)  Occupational History    Comment: [employer], Resp Therapist  Tobacco Use   Smoking status: Never   Smokeless tobacco: Never  Vaping Use   Vaping Use: Never used  Substance and Sexual Activity   Alcohol use: Not Currently    Alcohol/week: 0.0 standard drinks of alcohol    Comment: rare   Drug use: No   Sexual activity: Yes    Birth control/protection: Injection  Other Topics Concern   Not  on file  Social History Narrative   Lives in [place] with husband and children.  Not currently exercising.  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   Caffeine 2-3 cups daily   Social Determinants of Health   Financial Resource Strain: Not on file  Food Insecurity: Not on file  Transportation Needs: Not on file  Physical Activity: Not on file  Stress: Not on file  Social Connections: Not on file   Family History  Problem Relation Age of Onset   Diabetes Mother    Memory loss Mother    Arthritis Father    Other Father        atrial flutter   Emphysema Father        smoker   Diabetes Maternal Aunt    Stroke Paternal Aunt    Diabetes Maternal Uncle    Arthritis Maternal Grandmother    Diabetes Maternal Grandmother    Cancer Maternal Grandmother        skin   Arthritis Paternal Grandmother    Cancer Paternal Grandmother        skin   Allergies  Allergen Reactions   Zithromax [Azithromycin] Other (See Comments)    "convulsions or  tremors"   Current Outpatient Medications  Medication Sig Dispense Refill   aspirin EC 325 MG tablet Take 1 tablet (325 mg total) by mouth daily. 30 tablet 0   aspirin-acetaminophen-caffeine (EXCEDRIN MIGRAINE) [id number] MG tablet Take by mouth daily.     clonazePAM (KLONOPIN) 0.5 MG tablet Take 0.5 mg by mouth 2 (two) times daily as needed.     medroxyPROGESTERone Acetate 150 MG/ML SUSY INJECT 1 ML INTO THE MUSCLE EVERY 3 MONTHS. 1 mL 3   oxyCODONE (OXY IR/ROXICODONE) 5 MG immediate release tablet Take 1 tablet (5 mg total) by mouth every 4 (four) hours as needed (severe pain). 20 tablet 0   Rimegepant Sulfate (NURTEC) 75 MG TBDP Take 75 mg by mouth every other day. 16 tablet 3   traMADol (ULTRAM) 50 MG tablet Take 1 tablet (50 mg total) by mouth every 12 (twelve) hours as needed for moderate pain or severe pain. 15 tablet 2   No current facility-administered medications for this visit.   No results found.  Review of Systems:   A ROS was performed including  pertinent positives and negatives as documented in the HPI.   Musculoskeletal Exam:    There were no vitals taken for this visit.  Left hip incision is well-appearing without erythema or drainage.  Range of motion is 30 degrees internal/external rotation of the hip.  She is able to elevate to 90 degrees.  Distal neurosensory exam is intact  Imaging:      I personally reviewed and interpreted the radiographs.   Assessment:   42 year old female who is 2 weeks status post left hip gluteus medius repair overall doing extremely well.  This time she will advance her weightbearing to full.  I will plan to see her back in 4 weeks for reassessment.  All limitations and restrictions were discussed  Plan :    -Return to clinic in 4 weeks for reassessment      I personally saw and evaluated the patient, and participated in the management and treatment plan.  Vanetta Mulders, MD Attending Physician, Orthopedic Surgery  This document was dictated using Dragon voice recognition software. A reasonable attempt at proof reading has been made to minimize errors.

## 2022-06-07 NOTE — Therapy (Signed)
OUTPATIENT PHYSICAL THERAPY LOWER EXTREMITY Treatment    Patient Name: Sarah Gallagher MRN: [id number] DOB:[birth year], 42 y.o., female Today's Date: 06/08/2022  END OF SESSION:  PT End of Session - 06/08/22 1042     Visit Number 3    Number of Visits 16    Date for PT Re-Evaluation 07/21/22    PT Start Time [id number]    PT Stop Time 1558    PT Time Calculation (min) 43 min    Activity Tolerance Patient tolerated treatment well    Behavior During Therapy WFL for tasks assessed/performed             Past Medical History:  Diagnosis Date   Anxiety    Arthritis    Chest discomfort    a. associated with palpitations.   Depression    Dyspnea on exertion    a. 12/2016 Echo: EF 55-60%, no rwma, nl PASP.   Juvenile rheumatoid arthritis (HCC)    Meniere disease    Migraines    Neuropathy    Palpitations    PONV (postoperative nausea and vomiting)    PVC's (premature ventricular contractions)    a. 01/2017 48h Holter: Rare PAC/PVC. Freq runs of sinus tachycardia.   Past Surgical History:  Procedure Laterality Date   GLUTEUS MINIMUS REPAIR Left 05/23/2022   Procedure: LEFT GLUTEUS MEDIUS REPAIR;  Surgeon: Vanetta Mulders, MD;  Location: [place];  Service: Orthopedics;  Laterality: Left;   KNEE SURGERY Right 1997, 1997   torn meniscus, ACL replacement     REFRACTIVE SURGERY     lasix   Patient Active Problem List   Diagnosis Date Noted   Tendinopathy of gluteus medius 05/23/2022   Encounter for general adult medical examination with abnormal findings 11/30/2021   Attention and concentration deficit 10/18/2021   Tinnitus of both ears 08/25/2021   Imbalance 08/25/2021   Abnormal auditory perception of both ears 08/25/2021   Migraine 10/29/2020   Obesity (BMI 30-39.9) 10/29/2020   Dysplasia of cervix, low grade (CIN 1) 10/16/2018   Chiari I malformation ([place]) 10/16/2018   Cervical radiculopathy 09/06/2018   Chronic hip pain, left 09/06/2018   Iron  deficiency 09/06/2018   Anxiety disorder 11/27/2017    PCP: Agnes Lawrence MD   REFERRING PROVIDER: Dr Vanetta Mulders  REFERRING DIAG: L glut med repair   THERAPY DIAG:  Pain in left hip  Stiffness of left hip, not elsewhere classified  Other abnormalities of gait and mobility  Rationale for Evaluation and Treatment: Rehabilitation  ONSET DATE: 05/23/2022  SUBJECTIVE:   SUBJECTIVE STATEMENT: The patient has been to the MD. The MD cleared her to ambulate with full weight bearing. She has intermittent soreness. The soreness into her lower leg has improved.    PERTINENT HISTORY: Juvenile RA right ACL tear and repair 1997, migraines, depression PAIN:  Are you having pain? Yes: NPRS scale: 3/10 today  Pain location: left anterior hip  Pain description: pain can radiate down  Aggravating factors: being upright Relieving factors: rest and propping it up; pain meds  PRECAUTIONS: Other: protocol limitations   WEIGHT BEARING RESTRICTIONS: Yes TDWB for 2 weeks   FALLS:  Has patient fallen in last 6 months? No  LIVING ENVIRONMENT: 5 steps into the house OCCUPATION:  Works in the [employer]: get back into the gym Lift weights   PLOF: Independent  PATIENT GOALS:   To be able to do activity with less pain   NEXT MD VISIT:  2 weeks  OBJECTIVE:   DIAGNOSTIC FINDINGS:  Nothing post op    PATIENT SURVEYS:    COGNITION: Overall cognitive status: Within functional limits for tasks assessed     SENSATION: A little numb on the back side of the incision; pain radiating into her knee and ankle  EDEMA:    MUSCLE LENGTH:  POSTURE: No Significant postural limitations  PALPATION: No unexpected tenderness to palpation   LOWER EXTREMITY ROM:  Passive ROM Right eval Left eval  Hip flexion    Hip extension    Hip abduction    Hip adduction    Hip internal rotation    Hip external rotation    Knee flexion    Knee extension    Ankle dorsiflexion    Ankle  plantarflexion    Ankle inversion    Ankle eversion     (Blank rows = not tested)  LOWER EXTREMITY MMT:  MMT Right eval Left eval  Hip flexion  90  Hip extension    Hip abduction  10  Hip adduction  N/A  Hip internal rotation  5  Hip external rotation  N/A  Knee flexion    Knee extension    Ankle dorsiflexion    Ankle plantarflexion    Ankle inversion    Ankle eversion     (Blank rows = not tested) not tested 2nd to recent surgery   FUNCTIONAL TESTS:  Using chair to press up  GAIT: Using rollator walker. Appears to be performing TDWB but was educated on the importance of maintaining that for the first 2 weeks.   TODAY'S TREATMENT:                                                                                                                              DATE: 3/6 Qaud set 2x20  Hip extension Iso 2x15  Hamstring iso 2x15  Hip adduction isometric 2x20  PPT x20   Upright bike: 6 min no resistance. Reviwed getting on and off and seat height  PROM into protocol limits. Patient easily reached her protocol limits. Trigger point release and roller to lateral inferior quad      Last visit PROM into protocol limits. Patient easily reached her protocol limits. Trigger point release and roller to lateral inferior quad   Qaud set 2x20  Hip extension Iso 2x15  Hamstring iso 2x15  Hip adduction isometric 2x20  PPT x20   Gait training: Reviewed use of single-point cane as a transition from the walker.  She is going to try to walk at home without a device.  If she has increased pain she will purchase a cane.  We reviewed proper fitting of the cane and proper gait pattern with the cane.    Eval:  Access Code: [facility] URL: [email or web address] Date: 05/26/2022 Prepared by: Carolyne Littles  Exercises - Supine Quad Set  - 5 x daily - 7 x weekly - 3 sets - 10 reps - Seated Ankle Pumps  -  5 x daily - 7 x weekly - 3 sets - 10 reps   PATIENT EDUCATION:   Education details: HEP, symptom management; importance of weight bearing.  Person educated: Patient Education method: Explanation, Demonstration, Tactile cues, Verbal cues, and Handouts Education comprehension: verbalized understanding, returned demonstration, verbal cues required, and tactile cues required  HOME EXERCISE PROGRAM: Access Code: [facility] URL: [email or web address] Date: 05/26/2022 Prepared by: Carolyne Littles  Exercises - Supine Quad Set  - 5 x daily - 7 x weekly - 3 sets - 10 reps - Seated Ankle Pumps  - 5 x daily - 7 x weekly - 3 sets - 10 reps  ASSESSMENT:  CLINICAL IMPRESSION: The patient tolerated treatment well. We added in the exercise bike without increased pain.  Overall her range of motion is progressing as would be expected.  The pain that was going down her shin and into her ankle has resolved.  She has a minor trigger point in her inferior quad today but that is improved as well.  We had a short arc quad in today to her exercise program.  Therapy will continue to progress per protocol.  OBJECTIVE IMPAIRMENTS: Abnormal gait, decreased activity tolerance, decreased endurance, decreased mobility, difficulty walking, decreased ROM, decreased strength, and pain.   ACTIVITY LIMITATIONS: carrying, lifting, bending, sitting, standing, squatting, sleeping, stairs, transfers, bed mobility, and locomotion level  PARTICIPATION LIMITATIONS: meal prep, cleaning, laundry, driving, shopping, community activity, occupation, and yard work  PERSONAL FACTORS: 1-2 comorbidities: has had a nerupathy like condition in the past  are also affecting patient's functional outcome.   REHAB POTENTIAL: Excellent  CLINICAL DECISION MAKING: Stable/uncomplicated  EVALUATION COMPLEXITY: Low   GOALS: Goals reviewed with patient? Yes  SHORT TERM GOALS: Target date: 06/23/2022   Patient will be independent with basic exercise program Baseline: Goal status: INITIAL  2.   Patient will be weightbearing as tolerated with walker and progressed off walker as tolerated baseline:  Goal status: INITIAL  3.  Patient will perform all bed mobility independently Baseline:  Goal status: INITIAL LONG TERM GOALS: Target date: 07/21/2022    Patient will go up and down 5 steps with reciprocal gait in order to get in and out of her house Baseline:  Goal status: INITIAL  2.  Patient will ambulate 3000 feet with no pain or assistive device in order to go shopping Baseline:  Goal status: INITIAL  3.  Patient will stand for 1 hour without self-report of pain in order to return to work Baseline:  Goal status: INITIAL  PLAN:  PT FREQUENCY: 2x/week  PT DURATION: 8 weeks  PLANNED INTERVENTIONS: Therapeutic exercises, Therapeutic activity, Neuromuscular re-education, Balance training, Gait training, Patient/Family education, Self Care, Joint mobilization, Stair training, Aquatic Therapy, Dry Needling, Cryotherapy, Moist heat, Taping, Ultrasound, Manual therapy, and Re-evaluation  PLAN FOR NEXT SESSION: begin per protocol for glut med repair    Carney Living, PT 06/08/2022, 10:46 AM

## 2022-06-08 ENCOUNTER — Encounter (HOSPITAL_BASED_OUTPATIENT_CLINIC_OR_DEPARTMENT_OTHER): Payer: Self-pay | Admitting: Physical Therapy

## 2022-06-13 ENCOUNTER — Encounter: Payer: 59 | Attending: Psychology

## 2022-06-13 DIAGNOSIS — R41840 Attention and concentration deficit: Secondary | ICD-10-CM | POA: Diagnosis not present

## 2022-06-13 DIAGNOSIS — Z8659 Personal history of other mental and behavioral disorders: Secondary | ICD-10-CM | POA: Diagnosis not present

## 2022-06-13 DIAGNOSIS — F411 Generalized anxiety disorder: Secondary | ICD-10-CM | POA: Diagnosis not present

## 2022-06-13 NOTE — Progress Notes (Signed)
   Behavioral Observations:  The patient appeared well-groomed and appropriately dressed. Her manners were polite and appropriate to the situation. The patient's attitude towards testing was positive and her effort was good. The patient experienced some frustration and difficulty understanding instructions during the CAB and became tearful during the CPT.   Neuropsychology Note  MARNITA POIRIER completed 80 minutes of neuropsychological testing with technician, Dina Rich, BA, under the supervision of Ilean Skill, PsyD., Clinical Neuropsychologist. The patient did not appear overtly distressed by the testing session, per behavioral observation or via self-report to the technician. Rest breaks were offered.   Clinical Decision Making: In considering the patient's current level of functioning, level of presumed impairment, nature of symptoms, emotional and behavioral responses during clinical interview, level of literacy, and observed level of motivation/effort, a battery of tests was selected by Dr. Sima Matas during initial consultation on 05/01/2022. This was communicated to the technician. Communication between the neuropsychologist and technician was ongoing throughout the testing session and changes were made as deemed necessary based on patient performance on testing, technician observations and additional pertinent factors such as those listed above.  Tests Administered: Comprehensive Attention Battery (CAB) Continuous Performance Test (CPT)  Results: Will be included in final report   Feedback to Patient: KEILLY FATULA will return on 01/04/2023 for an interactive feedback session with Dr. Sima Matas at which time her test performances, clinical impressions and treatment recommendations will be reviewed in detail. The patient understands she can contact our office should she require our assistance before this time.  80 minutes spent face-to-face with patient administering standardized  tests, 30 minutes spent scoring Environmental education officer). [CPT [id number], [id number]]  Full report to follow.

## 2022-06-14 ENCOUNTER — Other Ambulatory Visit (HOSPITAL_BASED_OUTPATIENT_CLINIC_OR_DEPARTMENT_OTHER): Payer: Self-pay

## 2022-06-16 ENCOUNTER — Ambulatory Visit (HOSPITAL_BASED_OUTPATIENT_CLINIC_OR_DEPARTMENT_OTHER): Payer: 59 | Admitting: Physical Therapy

## 2022-06-16 ENCOUNTER — Encounter (HOSPITAL_BASED_OUTPATIENT_CLINIC_OR_DEPARTMENT_OTHER): Payer: Self-pay | Admitting: Physical Therapy

## 2022-06-16 DIAGNOSIS — R2689 Other abnormalities of gait and mobility: Secondary | ICD-10-CM

## 2022-06-16 DIAGNOSIS — M25652 Stiffness of left hip, not elsewhere classified: Secondary | ICD-10-CM | POA: Diagnosis not present

## 2022-06-16 DIAGNOSIS — M25552 Pain in left hip: Secondary | ICD-10-CM

## 2022-06-16 NOTE — Therapy (Signed)
OUTPATIENT PHYSICAL THERAPY LOWER EXTREMITY Treatment    Patient Name: Sarah Gallagher MRN: [id number] DOB:May 18, 1980, 42 y.o., female Today's Date: 06/16/2022  END OF SESSION:  PT End of Session - 06/16/22 0813     Visit Number 4    Number of Visits 16    Date for PT Re-Evaluation 07/21/22    PT Start Time 0808    PT Stop Time 0849    PT Time Calculation (min) 41 min    Activity Tolerance Patient tolerated treatment well    Behavior During Therapy [facility] for tasks assessed/performed             Past Medical History:  Diagnosis Date   Anxiety    Arthritis    Chest discomfort    a. associated with palpitations.   Depression    Dyspnea on exertion    a. 12/2016 Echo: EF 55-60%, no rwma, nl PASP.   Juvenile rheumatoid arthritis (HCC)    Meniere disease    Migraines    Neuropathy    Palpitations    PONV (postoperative nausea and vomiting)    PVC's (premature ventricular contractions)    a. 01/2017 48h Holter: Rare PAC/PVC. Freq runs of sinus tachycardia.   Past Surgical History:  Procedure Laterality Date   GLUTEUS MINIMUS REPAIR Left 05/23/2022   Procedure: LEFT GLUTEUS MEDIUS REPAIR;  Surgeon: Vanetta Mulders, MD;  Location: [place];  Service: Orthopedics;  Laterality: Left;   KNEE SURGERY Right 1997, 1997   torn meniscus, ACL replacement     REFRACTIVE SURGERY     lasix   Patient Active Problem List   Diagnosis Date Noted   Tendinopathy of gluteus medius 05/23/2022   Encounter for general adult medical examination with abnormal findings 11/30/2021   Attention and concentration deficit 10/18/2021   Tinnitus of both ears 08/25/2021   Imbalance 08/25/2021   Abnormal auditory perception of both ears 08/25/2021   Migraine 10/29/2020   Obesity (BMI 30-39.9) 10/29/2020   Dysplasia of cervix, low grade (CIN 1) 10/16/2018   Chiari I malformation ([place]) 10/16/2018   Cervical radiculopathy 09/06/2018   Chronic hip pain, left 09/06/2018   Iron  deficiency 09/06/2018   Anxiety disorder 11/27/2017    PCP: Agnes Lawrence MD   REFERRING PROVIDER: Dr Vanetta Mulders  REFERRING DIAG: L glut med repair   THERAPY DIAG:  Pain in left hip  Stiffness of left hip, not elsewhere classified  Other abnormalities of gait and mobility  Rationale for Evaluation and Treatment: Rehabilitation  ONSET DATE: 05/23/2022  SUBJECTIVE:   SUBJECTIVE STATEMENT: The patient has been to the MD. The MD cleared her to ambulate with full weight bearing. She has intermittent soreness. The soreness into her lower leg has improved.    PERTINENT HISTORY: Juvenile RA right ACL tear and repair 1997, migraines, depression PAIN:  Are you having pain? Yes: NPRS scale: 3/10 today  Pain location: left anterior hip  Pain description: pain can radiate down  Aggravating factors: being upright Relieving factors: rest and propping it up; pain meds  PRECAUTIONS: Other: protocol limitations   WEIGHT BEARING RESTRICTIONS: Yes TDWB for 2 weeks   FALLS:  Has patient fallen in last 6 months? No  LIVING ENVIRONMENT: 5 steps into the house OCCUPATION:  Works in the [employer]: get back into the gym Lift weights   PLOF: Independent  PATIENT GOALS:   To be able to do activity with less pain   NEXT MD VISIT:  2 weeks  OBJECTIVE:   DIAGNOSTIC FINDINGS:  Nothing post op    PATIENT SURVEYS:    COGNITION: Overall cognitive status: Within functional limits for tasks assessed     SENSATION: A little numb on the back side of the incision; pain radiating into her knee and ankle  EDEMA:    MUSCLE LENGTH:  POSTURE: No Significant postural limitations  PALPATION: No unexpected tenderness to palpation   LOWER EXTREMITY ROM:  Passive ROM Right eval Left eval  Hip flexion    Hip extension    Hip abduction    Hip adduction    Hip internal rotation    Hip external rotation    Knee flexion    Knee extension    Ankle dorsiflexion    Ankle  plantarflexion    Ankle inversion    Ankle eversion     (Blank rows = not tested)  LOWER EXTREMITY MMT:  MMT Right eval Left eval  Hip flexion  90  Hip extension    Hip abduction  10  Hip adduction  N/A  Hip internal rotation  5  Hip external rotation  N/A  Knee flexion    Knee extension    Ankle dorsiflexion    Ankle plantarflexion    Ankle inversion    Ankle eversion     (Blank rows = not tested) not tested 2nd to recent surgery   FUNCTIONAL TESTS:  Using chair to press up  GAIT: Using rollator walker. Appears to be performing TDWB but was educated on the importance of maintaining that for the first 2 weeks.   TODAY'S TREATMENT:                                                                                                                              DATE: 3/15 PPT x20  Bridge 3x10   Quadruped rocking 2x10   Hip flexion 3x10   PROM into protocol limits  Trigger point release to upper gluteal and lower back      3/6 Qaud set 2x20  Hip extension Iso 2x15  Hamstring iso 2x15  Hip adduction isometric 2x20  PPT x20   Upright bike: 6 min no resistance. Reviwed getting on and off and seat height  PROM into protocol limits. Patient easily reached her protocol limits. Trigger point release and roller to lateral inferior quad      Last visit PROM into protocol limits. Patient easily reached her protocol limits. Trigger point release and roller to lateral inferior quad   Qaud set 2x20  Hip extension Iso 2x15  Hamstring iso 2x15  Hip adduction isometric 2x20  PPT x20   Gait training: Reviewed use of single-point cane as a transition from the walker.  She is going to try to walk at home without a device.  If she has increased pain she will purchase a cane.  We reviewed proper fitting of the cane and proper gait pattern with the cane.    Eval:  Access Code: [facility] URL: [email or web address] Date: 05/26/2022 Prepared by: Carolyne Littles  Exercises - Supine Quad Set  - 5 x daily - 7 x weekly - 3 sets - 10 reps - Seated Ankle Pumps  - 5 x daily - 7 x weekly - 3 sets - 10 reps   PATIENT EDUCATION:  Education details: HEP, symptom management; importance of weight bearing.  Person educated: Patient Education method: Explanation, Demonstration, Tactile cues, Verbal cues, and Handouts Education comprehension: verbalized understanding, returned demonstration, verbal cues required, and tactile cues required  HOME EXERCISE PROGRAM: Access Code: [facility] URL: [email or web address] Date: 05/26/2022 Prepared by: Carolyne Littles  Exercises - Supine Quad Set  - 5 x daily - 7 x weekly - 3 sets - 10 reps - Seated Ankle Pumps  - 5 x daily - 7 x weekly - 3 sets - 10 reps  ASSESSMENT:  CLINICAL IMPRESSION: The patient had spasming in her gluteal today. Therapy performed manual therapy to the gluteal. She had a good improvement in motion. She had full motion with minimal PROM. We advanced her exercises per protocol but were careful 2nd to acute increase in low back pain over the past few days. We will continue to progress as tolerated.  OBJECTIVE IMPAIRMENTS: Abnormal gait, decreased activity tolerance, decreased endurance, decreased mobility, difficulty walking, decreased ROM, decreased strength, and pain.   ACTIVITY LIMITATIONS: carrying, lifting, bending, sitting, standing, squatting, sleeping, stairs, transfers, bed mobility, and locomotion level  PARTICIPATION LIMITATIONS: meal prep, cleaning, laundry, driving, shopping, community activity, occupation, and yard work  PERSONAL FACTORS: 1-2 comorbidities: has had a nerupathy like condition in the past  are also affecting patient's functional outcome.   REHAB POTENTIAL: Excellent  CLINICAL DECISION MAKING: Stable/uncomplicated  EVALUATION COMPLEXITY: Low   GOALS: Goals reviewed with patient? Yes  SHORT TERM GOALS: Target date: 06/23/2022   Patient  will be independent with basic exercise program Baseline: Goal status: INITIAL  2.  Patient will be weightbearing as tolerated with walker and progressed off walker as tolerated baseline:  Goal status: INITIAL  3.  Patient will perform all bed mobility independently Baseline:  Goal status: INITIAL LONG TERM GOALS: Target date: 07/21/2022    Patient will go up and down 5 steps with reciprocal gait in order to get in and out of her house Baseline:  Goal status: INITIAL  2.  Patient will ambulate 3000 feet with no pain or assistive device in order to go shopping Baseline:  Goal status: INITIAL  3.  Patient will stand for 1 hour without self-report of pain in order to return to work Baseline:  Goal status: INITIAL  PLAN:  PT FREQUENCY: 2x/week  PT DURATION: 8 weeks  PLANNED INTERVENTIONS: Therapeutic exercises, Therapeutic activity, Neuromuscular re-education, Balance training, Gait training, Patient/Family education, Self Care, Joint mobilization, Stair training, Aquatic Therapy, Dry Needling, Cryotherapy, Moist heat, Taping, Ultrasound, Manual therapy, and Re-evaluation  PLAN FOR NEXT SESSION: begin per protocol for glut med repair    Carney Living, PT 06/16/2022, 10:10 AM

## 2022-06-19 ENCOUNTER — Telehealth: Payer: Self-pay | Admitting: Orthopaedic Surgery

## 2022-06-19 NOTE — Telephone Encounter (Signed)
[place] forms received. To [employer]. 

## 2022-06-21 ENCOUNTER — Other Ambulatory Visit (HOSPITAL_BASED_OUTPATIENT_CLINIC_OR_DEPARTMENT_OTHER): Payer: Self-pay

## 2022-06-23 ENCOUNTER — Encounter (HOSPITAL_BASED_OUTPATIENT_CLINIC_OR_DEPARTMENT_OTHER): Payer: Self-pay | Admitting: Physical Therapy

## 2022-06-23 ENCOUNTER — Ambulatory Visit (HOSPITAL_BASED_OUTPATIENT_CLINIC_OR_DEPARTMENT_OTHER): Payer: 59 | Admitting: Physical Therapy

## 2022-06-23 ENCOUNTER — Other Ambulatory Visit (HOSPITAL_BASED_OUTPATIENT_CLINIC_OR_DEPARTMENT_OTHER): Payer: Self-pay

## 2022-06-23 DIAGNOSIS — M25652 Stiffness of left hip, not elsewhere classified: Secondary | ICD-10-CM

## 2022-06-23 DIAGNOSIS — M25552 Pain in left hip: Secondary | ICD-10-CM | POA: Diagnosis not present

## 2022-06-23 DIAGNOSIS — R2689 Other abnormalities of gait and mobility: Secondary | ICD-10-CM

## 2022-06-23 NOTE — Therapy (Signed)
OUTPATIENT PHYSICAL THERAPY LOWER EXTREMITY Treatment    Patient Name: Sarah Gallagher MRN: [id number] DOB:[birth year], 42 y.o., female Today's Date: 06/16/2022  END OF SESSION:  PT End of Session - 06/16/22 0813     Visit Number 4    Number of Visits 16    Date for PT Re-Evaluation 07/21/22    PT Start Time 0808    PT Stop Time 0849    PT Time Calculation (min) 41 min    Activity Tolerance Patient tolerated treatment well    Behavior During Therapy [facility] for tasks assessed/performed             Past Medical History:  Diagnosis Date   Anxiety    Arthritis    Chest discomfort    a. associated with palpitations.   Depression    Dyspnea on exertion    a. 12/2016 Echo: EF 55-60%, no rwma, nl PASP.   Juvenile rheumatoid arthritis (HCC)    Meniere disease    Migraines    Neuropathy    Palpitations    PONV (postoperative nausea and vomiting)    PVC's (premature ventricular contractions)    a. 01/2017 48h Holter: Rare PAC/PVC. Freq runs of sinus tachycardia.   Past Surgical History:  Procedure Laterality Date   GLUTEUS MINIMUS REPAIR Left 05/23/2022   Procedure: LEFT GLUTEUS MEDIUS REPAIR;  Surgeon: Vanetta Mulders, MD;  Location: [place];  Service: Orthopedics;  Laterality: Left;   KNEE SURGERY Right 1997, 1997   torn meniscus, ACL replacement     REFRACTIVE SURGERY     lasix   Patient Active Problem List   Diagnosis Date Noted   Tendinopathy of gluteus medius 05/23/2022   Encounter for general adult medical examination with abnormal findings 11/30/2021   Attention and concentration deficit 10/18/2021   Tinnitus of both ears 08/25/2021   Imbalance 08/25/2021   Abnormal auditory perception of both ears 08/25/2021   Migraine 10/29/2020   Obesity (BMI 30-39.9) 10/29/2020   Dysplasia of cervix, low grade (CIN 1) 10/16/2018   Chiari I malformation ([place]) 10/16/2018   Cervical radiculopathy 09/06/2018   Chronic hip pain, left 09/06/2018   Iron  deficiency 09/06/2018   Anxiety disorder 11/27/2017    PCP: Agnes Lawrence MD   REFERRING PROVIDER: Dr Vanetta Mulders  REFERRING DIAG: L glut med repair   THERAPY DIAG:  Pain in left hip  Stiffness of left hip, not elsewhere classified  Other abnormalities of gait and mobility  Rationale for Evaluation and Treatment: Rehabilitation  ONSET DATE: 05/23/2022  SUBJECTIVE:   SUBJECTIVE STATEMENT: The patient reports she has tightness in her posterior chain.  PERTINENT HISTORY: Juvenile RA right ACL tear and repair 1997, migraines, depression PAIN:  Are you having pain? Yes: NPRS scale: 3/10 today  Pain location: left anterior hip  Pain description: pain can radiate down  Aggravating factors: being upright Relieving factors: rest and propping it up; pain meds  PRECAUTIONS: Other: protocol limitations   WEIGHT BEARING RESTRICTIONS: Yes TDWB for 2 weeks   FALLS:  Has patient fallen in last 6 months? No  LIVING ENVIRONMENT: 5 steps into the house OCCUPATION:  Works in the [place]: get back into the gym Lift weights   PLOF: Independent  PATIENT GOALS:   To be able to do activity with less pain   NEXT MD VISIT:  2 weeks    Days since surgery: 31  OBJECTIVE:   DIAGNOSTIC FINDINGS:  Nothing post op    PATIENT  SURVEYS:    COGNITION: Overall cognitive status: Within functional limits for tasks assessed     SENSATION: A little numb on the back side of the incision; pain radiating into her knee and ankle  EDEMA:    MUSCLE LENGTH:  POSTURE: No Significant postural limitations  PALPATION: No unexpected tenderness to palpation   LOWER EXTREMITY ROM:  Passive ROM Right eval Left eval  Hip flexion    Hip extension    Hip abduction    Hip adduction    Hip internal rotation    Hip external rotation    Knee flexion    Knee extension    Ankle dorsiflexion    Ankle plantarflexion    Ankle inversion    Ankle eversion     (Blank rows = not  tested)  LOWER EXTREMITY MMT:  MMT Right eval Left eval  Hip flexion  90  Hip extension    Hip abduction  10  Hip adduction  N/A  Hip internal rotation  5  Hip external rotation  N/A  Knee flexion    Knee extension    Ankle dorsiflexion    Ankle plantarflexion    Ankle inversion    Ankle eversion     (Blank rows = not tested) not tested 2nd to recent surgery   FUNCTIONAL TESTS:  Using chair to press up  GAIT: Using rollator walker. Appears to be performing TDWB but was educated on the importance of maintaining that for the first 2 weeks.   TODAY'S TREATMENT:                                                                                                                              DATE: 3/22 Bike x 49mins  PPT 2x20  Bridge 3x12  Quadruped rocking 2x10  Hip flexion isometric 3x10  Quadruped alt UE  2x10 Standing gastroc stretch 3x 15sec Standing heel raise 2x10   3/15 PPT x20  Bridge 3x10   Quadruped rocking 2x10   Hip flexion 3x10   PROM into protocol limits  Trigger point release to upper gluteal and lower back      3/6 Qaud set 2x20  Hip extension Iso 2x15  Hamstring iso 2x15  Hip adduction isometric 2x20  PPT x20   Upright bike: 6 min no resistance. Reviwed getting on and off and seat height  PROM into protocol limits. Patient easily reached her protocol limits. Trigger point release and roller to lateral inferior quad      Last visit PROM into protocol limits. Patient easily reached her protocol limits. Trigger point release and roller to lateral inferior quad   Qaud set 2x20  Hip extension Iso 2x15  Hamstring iso 2x15  Hip adduction isometric 2x20  PPT x20   Gait training: Reviewed use of single-point cane as a transition from the walker.  She is going to try to walk at home without a device.  If she has increased pain she  will purchase a cane.  We reviewed proper fitting of the cane and proper gait pattern with the cane.    Eval:   Access Code: [facility] URL: [email or web address] Date: 05/26/2022 Prepared by: Carolyne Littles  Exercises - Supine Quad Set  - 5 x daily - 7 x weekly - 3 sets - 10 reps - Seated Ankle Pumps  - 5 x daily - 7 x weekly - 3 sets - 10 reps   PATIENT EDUCATION:  Education details: HEP, symptom management; importance of weight bearing.  Person educated: Patient Education method: Explanation, Demonstration, Tactile cues, Verbal cues, and Handouts Education comprehension: verbalized understanding, returned demonstration, verbal cues required, and tactile cues required  HOME EXERCISE PROGRAM: Access Code: [facility] URL: [email or web address] Date: 05/26/2022 Prepared by: Carolyne Littles  Exercises - Supine Quad Set  - 5 x daily - 7 x weekly - 3 sets - 10 reps - Seated Ankle Pumps  - 5 x daily - 7 x weekly - 3 sets - 10 reps  ASSESSMENT:  CLINICAL IMPRESSION: Patient is experiencing a 1/10 pain. She has minor pain on her left posterior/lateral gluteal region. She started with a 6 min warm up on the bike. Her ROM is progressing very well with no increase of pain. She had full motion with minimal PROM. We advanced her exercises per protocol. We added a gastroc and hamstring stretch to address posterior chain tightness. HEP that she can perform at home. Overall, she is experiencing minor pain and is doing really well. We will continue to progress as tolerated.   OBJECTIVE IMPAIRMENTS: Abnormal gait, decreased activity tolerance, decreased endurance, decreased mobility, difficulty walking, decreased ROM, decreased strength, and pain.   ACTIVITY LIMITATIONS: carrying, lifting, bending, sitting, standing, squatting, sleeping, stairs, transfers, bed mobility, and locomotion level  PARTICIPATION LIMITATIONS: meal prep, cleaning, laundry, driving, shopping, community activity, occupation, and yard work  PERSONAL FACTORS: 1-2 comorbidities: has had a nerupathy like condition  in the past  are also affecting patient's functional outcome.   REHAB POTENTIAL: Excellent  CLINICAL DECISION MAKING: Stable/uncomplicated  EVALUATION COMPLEXITY: Low   GOALS: Goals reviewed with patient? Yes  SHORT TERM GOALS: Target date: 06/23/2022   Patient will be independent with basic exercise program Baseline: Goal status: INITIAL  2.  Patient will be weightbearing as tolerated with walker and progressed off walker as tolerated baseline:  Goal status: INITIAL  3.  Patient will perform all bed mobility independently Baseline:  Goal status: INITIAL LONG TERM GOALS: Target date: 07/21/2022    Patient will go up and down 5 steps with reciprocal gait in order to get in and out of her house Baseline:  Goal status: INITIAL  2.  Patient will ambulate 3000 feet with no pain or assistive device in order to go shopping Baseline:  Goal status: INITIAL  3.  Patient will stand for 1 hour without self-report of pain in order to return to work Baseline:  Goal status: INITIAL  PLAN:  PT FREQUENCY: 2x/week  PT DURATION: 8 weeks  PLANNED INTERVENTIONS: Therapeutic exercises, Therapeutic activity, Neuromuscular re-education, Balance training, Gait training, Patient/Family education, Self Care, Joint mobilization, Stair training, Aquatic Therapy, Dry Needling, Cryotherapy, Moist heat, Taping, Ultrasound, Manual therapy, and Re-evaluation  PLAN FOR NEXT SESSION: begin per protocol for glut med repair    Carney Living, PT 06/16/2022, 10:10 AM

## 2022-06-29 ENCOUNTER — Telehealth (HOSPITAL_BASED_OUTPATIENT_CLINIC_OR_DEPARTMENT_OTHER): Payer: Self-pay

## 2022-06-29 ENCOUNTER — Ambulatory Visit (HOSPITAL_BASED_OUTPATIENT_CLINIC_OR_DEPARTMENT_OTHER): Payer: 59

## 2022-06-29 NOTE — Telephone Encounter (Signed)
Called and spoke with pt regarding missed appt this morning. She thought her appt was for tomorrow. Next visit 07/03/22.

## 2022-07-03 ENCOUNTER — Encounter (HOSPITAL_BASED_OUTPATIENT_CLINIC_OR_DEPARTMENT_OTHER): Payer: Self-pay

## 2022-07-03 ENCOUNTER — Ambulatory Visit (HOSPITAL_BASED_OUTPATIENT_CLINIC_OR_DEPARTMENT_OTHER): Payer: 59 | Attending: Orthopaedic Surgery

## 2022-07-03 DIAGNOSIS — R2689 Other abnormalities of gait and mobility: Secondary | ICD-10-CM

## 2022-07-03 DIAGNOSIS — M25552 Pain in left hip: Secondary | ICD-10-CM | POA: Diagnosis not present

## 2022-07-03 DIAGNOSIS — M25652 Stiffness of left hip, not elsewhere classified: Secondary | ICD-10-CM | POA: Diagnosis not present

## 2022-07-03 NOTE — Therapy (Signed)
OUTPATIENT PHYSICAL THERAPY LOWER EXTREMITY Treatment    Patient Name: Sarah Gallagher MRN: [id number] DOB:May 11, 1980, 42 y.o., female Today's Date: 07/03/2022  END OF SESSION:  PT End of Session - 07/03/22 0821     Visit Number 6    Number of Visits 16    Date for PT Re-Evaluation 07/21/22    PT Start Time 0808    PT Stop Time 0858    PT Time Calculation (min) 50 min    Activity Tolerance Patient tolerated treatment well    Behavior During Therapy [facility] for tasks assessed/performed              Past Medical History:  Diagnosis Date   Anxiety    Arthritis    Chest discomfort    a. associated with palpitations.   Depression    Dyspnea on exertion    a. 12/2016 Echo: EF 55-60%, no rwma, nl PASP.   Juvenile rheumatoid arthritis    Meniere disease    Migraines    Neuropathy    Palpitations    PONV (postoperative nausea and vomiting)    PVC's (premature ventricular contractions)    a. 01/2017 48h Holter: Rare PAC/PVC. Freq runs of sinus tachycardia.   Past Surgical History:  Procedure Laterality Date   GLUTEUS MINIMUS REPAIR Left 05/23/2022   Procedure: LEFT GLUTEUS MEDIUS REPAIR;  Surgeon: Vanetta Mulders, MD;  Location: [place];  Service: Orthopedics;  Laterality: Left;   KNEE SURGERY Right 1997, 1997   torn meniscus, ACL replacement     REFRACTIVE SURGERY     lasix   Patient Active Problem List   Diagnosis Date Noted   Tendinopathy of gluteus medius 05/23/2022   Encounter for general adult medical examination with abnormal findings 11/30/2021   Attention and concentration deficit 10/18/2021   Tinnitus of both ears 08/25/2021   Imbalance 08/25/2021   Abnormal auditory perception of both ears 08/25/2021   Migraine 10/29/2020   Obesity (BMI 30-39.9) 10/29/2020   Dysplasia of cervix, low grade (CIN 1) 10/16/2018   Chiari I malformation 10/16/2018   Cervical radiculopathy 09/06/2018   Chronic hip pain, left 09/06/2018   Iron deficiency  09/06/2018   Anxiety disorder 11/27/2017    PCP: Agnes Lawrence MD   REFERRING PROVIDER: Dr Vanetta Mulders  REFERRING DIAG: L glut med repair   THERAPY DIAG:  Pain in left hip  Stiffness of left hip, not elsewhere classified  Other abnormalities of gait and mobility  Rationale for Evaluation and Treatment: Rehabilitation  ONSET DATE: 05/23/2022  SUBJECTIVE:   SUBJECTIVE STATEMENT: Pt reports minimal discomfort in L hip. Does note pretty severe L calf pain throughout length of gastroc over the last 2 weeks. Arrives without AD.  PERTINENT HISTORY: Juvenile RA right ACL tear and repair 1997, migraines, depression PAIN:  Are you having pain? Yes: NPRS scale: 3/10 today  Pain location: left anterior hip  Pain description: pain can radiate down  Aggravating factors: being upright Relieving factors: rest and propping it up; pain meds  PRECAUTIONS: Other: protocol limitations   WEIGHT BEARING RESTRICTIONS: Yes TDWB for 2 weeks   FALLS:  Has patient fallen in last 6 months? No  LIVING ENVIRONMENT: 5 steps into the house OCCUPATION:  Works in the [employer]: get back into the gym Lift weights   PLOF: Independent  PATIENT GOALS:   To be able to do activity with less pain   NEXT MD VISIT:  2 weeks    Days since surgery:  41  OBJECTIVE:   DIAGNOSTIC FINDINGS:  Nothing post op    PATIENT SURVEYS:    COGNITION: Overall cognitive status: Within functional limits for tasks assessed     SENSATION: A little numb on the back side of the incision; pain radiating into her knee and ankle  EDEMA:    MUSCLE LENGTH:  POSTURE: No Significant postural limitations  PALPATION: No unexpected tenderness to palpation   LOWER EXTREMITY ROM:  Passive ROM Right eval Left eval  Hip flexion    Hip extension    Hip abduction    Hip adduction    Hip internal rotation    Hip external rotation    Knee flexion    Knee extension    Ankle dorsiflexion    Ankle  plantarflexion    Ankle inversion    Ankle eversion     (Blank rows = not tested)  LOWER EXTREMITY MMT:  MMT Right eval Left eval  Hip flexion  90  Hip extension    Hip abduction  10  Hip adduction  N/A  Hip internal rotation  5  Hip external rotation  N/A  Knee flexion    Knee extension    Ankle dorsiflexion    Ankle plantarflexion    Ankle inversion    Ankle eversion     (Blank rows = not tested) not tested 2nd to recent surgery   FUNCTIONAL TESTS:  Using chair to press up  GAIT: Using rollator walker. Appears to be performing TDWB but was educated on the importance of maintaining that for the first 2 weeks.   TODAY'S TREATMENT:                                                                                                                              DATE:  4/1  STM to L gastroc PROM L Hip  Bike x 59mins  Supine march x20 Stool IR/ER rotations x10 Bridge 3x12   Standing gastroc stretch 3x 30sec at incline board  3/22 Bike x 3mins  PPT 2x20  Bridge 3x12  Quadruped rocking 2x10  Hip flexion isometric 3x10  Quadruped alt UE  2x10 Standing gastroc stretch 3x 15sec Standing heel raise 2x10   3/15 PPT x20  Bridge 3x10   Quadruped rocking 2x10   Hip flexion 3x10   PROM into protocol limits  Trigger point release to upper gluteal and lower back   Eval:  Access Code: [facility] URL: [email or web address] Date: 05/26/2022 Prepared by: Carolyne Littles  Exercises - Supine Quad Set  - 5 x daily - 7 x weekly - 3 sets - 10 reps - Seated Ankle Pumps  - 5 x daily - 7 x weekly - 3 sets - 10 reps   PATIENT EDUCATION:  Education details: HEP, symptom management; importance of weight bearing.  Person educated: Patient Education method: Explanation, Demonstration, Tactile cues, Verbal cues, and Handouts Education comprehension: verbalized understanding, returned demonstration, verbal cues required, and tactile cues  required  HOME EXERCISE  PROGRAM: Access Code: [facility] URL: [email or web address] Date: 05/26/2022 Prepared by: Carolyne Littles  Exercises - Supine Quad Set  - 5 x daily - 7 x weekly - 3 sets - 10 reps - Seated Ankle Pumps  - 5 x daily - 7 x weekly - 3 sets - 10 reps  ASSESSMENT:  CLINICAL IMPRESSION: Pt will be 6 weeks s/p tomorrow. No pain with passive DF. Pt tender throughout calf and inner thigh mm. Worked on [employer] in clinic to address this. Instructed pt in self STM and IASTM techniques to improve this at home. She has f/u with MD on Wednesday and is hoping to be cleared to RTW. Pt demonstrates overall good available ROM in L hip, though weakness evident. Pt may benefit from DN to address continued calf tightness/discomfort. Will continue to progress as tolerated.  OBJECTIVE IMPAIRMENTS: Abnormal gait, decreased activity tolerance, decreased endurance, decreased mobility, difficulty walking, decreased ROM, decreased strength, and pain.   ACTIVITY LIMITATIONS: carrying, lifting, bending, sitting, standing, squatting, sleeping, stairs, transfers, bed mobility, and locomotion level  PARTICIPATION LIMITATIONS: meal prep, cleaning, laundry, driving, shopping, community activity, occupation, and yard work  PERSONAL FACTORS: 1-2 comorbidities: has had a nerupathy like condition in the past  are also affecting patient's functional outcome.   REHAB POTENTIAL: Excellent  CLINICAL DECISION MAKING: Stable/uncomplicated  EVALUATION COMPLEXITY: Low   GOALS: Goals reviewed with patient? Yes  SHORT TERM GOALS: Target date: 06/23/2022   Patient will be independent with basic exercise program Baseline: Goal status: INITIAL  2.  Patient will be weightbearing as tolerated with walker and progressed off walker as tolerated baseline:  Goal status: INITIAL  3.  Patient will perform all bed mobility independently Baseline:  Goal status: INITIAL LONG TERM GOALS: Target date: 07/21/2022    Patient will  go up and down 5 steps with reciprocal gait in order to get in and out of her house Baseline:  Goal status: INITIAL  2.  Patient will ambulate 3000 feet with no pain or assistive device in order to go shopping Baseline:  Goal status: INITIAL  3.  Patient will stand for 1 hour without self-report of pain in order to return to work Baseline:  Goal status: INITIAL  PLAN:  PT FREQUENCY: 2x/week  PT DURATION: 8 weeks  PLANNED INTERVENTIONS: Therapeutic exercises, Therapeutic activity, Neuromuscular re-education, Balance training, Gait training, Patient/Family education, Self Care, Joint mobilization, Stair training, Aquatic Therapy, Dry Needling, Cryotherapy, Moist heat, Taping, Ultrasound, Manual therapy, and Re-evaluation  PLAN FOR NEXT SESSION: begin per protocol for glut med repair    Deniece Ree, PTA 07/03/2022, 9:19 AM

## 2022-07-05 ENCOUNTER — Ambulatory Visit (INDEPENDENT_AMBULATORY_CARE_PROVIDER_SITE_OTHER): Payer: 59 | Admitting: Orthopaedic Surgery

## 2022-07-05 DIAGNOSIS — M67959 Unspecified disorder of synovium and tendon, unspecified thigh: Secondary | ICD-10-CM

## 2022-07-05 NOTE — Progress Notes (Signed)
Post Operative Evaluation    Procedure/Date of Surgery: Left hip gluteus medius repair 05/23/22  Interval History:   6 weeks status post left gluteus medius repair to be extremely well.  At this time she is walking with a normal gait.  Denies any lateral hip pain.  Her back pain is also improved dramatically.   PMH/PSH/Family History/Social History/Meds/Allergies:    Past Medical History:  Diagnosis Date   Anxiety    Arthritis    Chest discomfort    a. associated with palpitations.   Depression    Dyspnea on exertion    a. 12/2016 Echo: EF 55-60%, no rwma, nl PASP.   Juvenile rheumatoid arthritis    Meniere disease    Migraines    Neuropathy    Palpitations    PONV (postoperative nausea and vomiting)    PVC's (premature ventricular contractions)    a. 01/2017 48h Holter: Rare PAC/PVC. Freq runs of sinus tachycardia.   Past Surgical History:  Procedure Laterality Date   GLUTEUS MINIMUS REPAIR Left 05/23/2022   Procedure: LEFT GLUTEUS MEDIUS REPAIR;  Surgeon: Vanetta Mulders, MD;  Location: [place];  Service: Orthopedics;  Laterality: Left;   KNEE SURGERY Right 1997, 1997   torn meniscus, ACL replacement     REFRACTIVE SURGERY     lasix   Social History   Socioeconomic History   Marital status: Married    Spouse name: ray   Number of children: 3   Years of education: BS   Highest education level: Bachelor's degree (e.g., BA, AB, BS)  Occupational History    Comment: [employer], Resp Therapist  Tobacco Use   Smoking status: Never   Smokeless tobacco: Never  Vaping Use   Vaping Use: Never used  Substance and Sexual Activity   Alcohol use: Not Currently    Alcohol/week: 0.0 standard drinks of alcohol    Comment: rare   Drug use: No   Sexual activity: Yes    Birth control/protection: Injection  Other Topics Concern   Not on file  Social History Narrative   Lives in [place] with husband and  children.  Not currently exercising.  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   Caffeine 2-3 cups daily   Social Determinants of Health   Financial Resource Strain: Not on file  Food Insecurity: Not on file  Transportation Needs: Not on file  Physical Activity: Not on file  Stress: Not on file  Social Connections: Not on file   Family History  Problem Relation Age of Onset   Diabetes Mother    Memory loss Mother    Arthritis Father    Other Father        atrial flutter   Emphysema Father        smoker   Diabetes Maternal Aunt    Stroke Paternal Aunt    Diabetes Maternal Uncle    Arthritis Maternal Grandmother    Diabetes Maternal Grandmother    Cancer Maternal Grandmother        skin   Arthritis Paternal Grandmother    Cancer Paternal Grandmother        skin   Allergies  Allergen Reactions   Zithromax [Azithromycin] Other (See Comments)    "convulsions or tremors"   Current Outpatient Medications  Medication Sig Dispense Refill  aspirin EC 325 MG tablet Take 1 tablet (325 mg total) by mouth daily. 30 tablet 0   aspirin-acetaminophen-caffeine (EXCEDRIN MIGRAINE) [id number] MG tablet Take by mouth daily.     clonazePAM (KLONOPIN) 0.5 MG tablet Take 0.5 mg by mouth 2 (two) times daily as needed.     medroxyPROGESTERone Acetate 150 MG/ML SUSY INJECT 1 ML INTO THE MUSCLE EVERY 3 MONTHS. 1 mL 3   oxyCODONE (OXY IR/ROXICODONE) 5 MG immediate release tablet Take 1 tablet (5 mg total) by mouth every 4 (four) hours as needed (severe pain). 20 tablet 0   Rimegepant Sulfate (NURTEC) 75 MG TBDP Take 75 mg by mouth every other day. 16 tablet 3   traMADol (ULTRAM) 50 MG tablet Take 1 tablet (50 mg total) by mouth every 12 (twelve) hours as needed for moderate pain or severe pain. 15 tablet 2   No current facility-administered medications for this visit.   No results found.  Review of Systems:   A ROS was performed including pertinent positives and negatives as documented in the  HPI.   Musculoskeletal Exam:    There were no vitals taken for this visit.  Left hip incision is well-appearing.  Range of motion is 30 degrees internal/external rotation of the hip.  She is able to elevate to 120 degrees.  Improved abduction strength.  Distal neurosensory exam is intact  Imaging:      I personally reviewed and interpreted the radiographs.   Assessment:   42 year old female who is 6 weeks status post left gluteus medius repair overall doing extremely well.  At this time she will continue to work through physical therapy for strengthening and range of motion.  I will plan to see her back in 6 weeks for likely final check  Plan :    -Return to clinic in 6 weeks for reassessment      I personally saw and evaluated the patient, and participated in the management and treatment plan.  Vanetta Mulders, MD Attending Physician, Orthopedic Surgery  This document was dictated using Dragon voice recognition software. A reasonable attempt at proof reading has been made to minimize errors.

## 2022-07-07 ENCOUNTER — Encounter (HOSPITAL_BASED_OUTPATIENT_CLINIC_OR_DEPARTMENT_OTHER): Payer: Self-pay | Admitting: Physical Therapy

## 2022-07-10 ENCOUNTER — Ambulatory Visit (HOSPITAL_BASED_OUTPATIENT_CLINIC_OR_DEPARTMENT_OTHER): Payer: 59 | Admitting: Physical Therapy

## 2022-07-10 DIAGNOSIS — M25552 Pain in left hip: Secondary | ICD-10-CM | POA: Diagnosis not present

## 2022-07-10 DIAGNOSIS — M25652 Stiffness of left hip, not elsewhere classified: Secondary | ICD-10-CM

## 2022-07-10 DIAGNOSIS — R2689 Other abnormalities of gait and mobility: Secondary | ICD-10-CM | POA: Diagnosis not present

## 2022-07-10 NOTE — Therapy (Signed)
OUTPATIENT PHYSICAL THERAPY LOWER EXTREMITY Treatment    Patient Name: Sarah Gallagher MRN: [id number] DOB:[birth year], 42 y.o., female Today's Date: 07/10/2022  END OF SESSION:  PT End of Session - 07/10/22 0852     Visit Number 7    Number of Visits 16    Date for PT Re-Evaluation 07/21/22    PT Start Time 0848    Activity Tolerance Patient tolerated treatment well    Behavior During Therapy [facility] for tasks assessed/performed              Past Medical History:  Diagnosis Date   Anxiety    Arthritis    Chest discomfort    a. associated with palpitations.   Depression    Dyspnea on exertion    a. 12/2016 Echo: EF 55-60%, no rwma, nl PASP.   Juvenile rheumatoid arthritis    Meniere disease    Migraines    Neuropathy    Palpitations    PONV (postoperative nausea and vomiting)    PVC's (premature ventricular contractions)    a. 01/2017 48h Holter: Rare PAC/PVC. Freq runs of sinus tachycardia.   Past Surgical History:  Procedure Laterality Date   GLUTEUS MINIMUS REPAIR Left 05/23/2022   Procedure: LEFT GLUTEUS MEDIUS REPAIR;  Surgeon: Huel Cote, MD;  Location: [facility];  Service: Orthopedics;  Laterality: Left;   KNEE SURGERY Right 1997, 1997   torn meniscus, ACL replacement     REFRACTIVE SURGERY     lasix   Patient Active Problem List   Diagnosis Date Noted   Tendinopathy of gluteus medius 05/23/2022   Encounter for general adult medical examination with abnormal findings 11/30/2021   Attention and concentration deficit 10/18/2021   Tinnitus of both ears 08/25/2021   Imbalance 08/25/2021   Abnormal auditory perception of both ears 08/25/2021   Migraine 10/29/2020   Obesity (BMI 30-39.9) 10/29/2020   Dysplasia of cervix, low grade (CIN 1) 10/16/2018   Chiari I malformation 10/16/2018   Cervical radiculopathy 09/06/2018   Chronic hip pain, left 09/06/2018   Iron deficiency 09/06/2018   Anxiety disorder 11/27/2017    PCP: Patterson Hammersmith  MD   REFERRING PROVIDER: Dr Huel Cote  REFERRING DIAG: L glut med repair   THERAPY DIAG:  No diagnosis found.  Rationale for Evaluation and Treatment: Rehabilitation  ONSET DATE: 05/23/2022  SUBJECTIVE:   SUBJECTIVE STATEMENT: Pt reports no pain at entry of therapy. Patient states that L gastroc is tight. Patient returned to work, was able to work 3 days full time, but halfway throughout work she started to feel some discomfort in her left hip. PERTINENT HISTORY: Juvenile RA right ACL tear and repair 1997, migraines, depression PAIN:  Are you having pain? Yes: NPRS scale: 3/10 today  Pain location: left anterior hip  Pain description: pain can radiate down  Aggravating factors: being upright Relieving factors: rest and propping it up; pain meds  PRECAUTIONS: Other: protocol limitations   WEIGHT BEARING RESTRICTIONS: Yes TDWB for 2 weeks   FALLS:  Has patient fallen in last 6 months? No  LIVING ENVIRONMENT: 5 steps into the house OCCUPATION:  Works in the ER  Hobbies: get back into the gym Lift weights   PLOF: Independent  PATIENT GOALS:   To be able to do activity with less pain   NEXT MD VISIT:  2 weeks    Days since surgery: 48  OBJECTIVE:   DIAGNOSTIC FINDINGS:  Nothing post op    PATIENT SURVEYS:  COGNITION: Overall cognitive status: Within functional limits for tasks assessed     SENSATION: A little numb on the back side of the incision; pain radiating into her knee and ankle  EDEMA:    MUSCLE LENGTH:  POSTURE: No Significant postural limitations  PALPATION: No unexpected tenderness to palpation   LOWER EXTREMITY ROM:  Passive ROM Right eval Left eval  Hip flexion    Hip extension    Hip abduction    Hip adduction    Hip internal rotation    Hip external rotation    Knee flexion    Knee extension    Ankle dorsiflexion    Ankle plantarflexion    Ankle inversion    Ankle eversion     (Blank rows = not  tested)  LOWER EXTREMITY MMT:  MMT Right eval Left eval  Hip flexion  90  Hip extension    Hip abduction  10  Hip adduction  N/A  Hip internal rotation  5  Hip external rotation  N/A  Knee flexion    Knee extension    Ankle dorsiflexion    Ankle plantarflexion    Ankle inversion    Ankle eversion     (Blank rows = not tested) not tested 2nd to recent surgery   FUNCTIONAL TESTS:  Using chair to press up  GAIT: Using rollator walker. Appears to be performing TDWB but was educated on the importance of maintaining that for the first 2 weeks.   TODAY'S TREATMENT:                                                                                                                              DATE: 4/8 Nustep x 5mins L4 Dry needling  Bridges 3x10 SAQ 3x10 1.5 lbs  Forward steps 4" 2x15  Lateral steps 4" 2x15 Standing marches 2x15  4/1 STM to L gastroc PROM L Hip  Bike x 6mins  Supine march x20 Stool IR/ER rotations x10 Bridge 3x12   Standing gastroc stretch 3x 30sec at incline board  3/22 Bike x 6mins  PPT 2x20  Bridge 3x12  Quadruped rocking 2x10  Hip flexion isometric 3x10  Quadruped alt UE  2x10 Standing gastroc stretch 3x 15sec Standing heel raise 2x10   3/15 PPT x20  Bridge 3x10   Quadruped rocking 2x10   Hip flexion 3x10   PROM into protocol limits  Trigger point release to upper gluteal and lower back   Eval:  [facility] Code: [email or web address] URL: [email or web address] Date: 05/26/2022 Prepared by: Lorayne Benderavid [employer]  Exercises - Supine Quad Set  - 5 x daily - 7 x weekly - 3 sets - 10 reps - Seated Ankle Pumps  - 5 x daily - 7 x weekly - 3 sets - 10 reps   PATIENT EDUCATION:  Education details: HEP, symptom management; importance of weight bearing.  Person educated: Patient Education method: Explanation, Demonstration, Tactile cues, Verbal cues, and Handouts Education comprehension: verbalized understanding, returned  demonstration, verbal  cues required, and tactile cues required  HOME EXERCISE PROGRAM: Access Code: [facility] URL: [email or web address] Date: 05/26/2022 Prepared by: Lorayne Bender  Exercises - Supine Quad Set  - 5 x daily - 7 x weekly - 3 sets - 10 reps - Seated Ankle Pumps  - 5 x daily - 7 x weekly - 3 sets - 10 reps  ASSESSMENT:  CLINICAL IMPRESSION: Pt will be 7 weeks s/p tomorrow. Patient had trigger points in her lateral L gastroc. Patient performed a trial of dry needling  she tolerated well. Patient started doing some standing marches. Patient was also able to start some steps up and lateral steps with 4" step. Pt continues to have good available ROM in L hip, however is still weak. Will continue to progress patient as tolerated and following protocol. We assessed her FOTO score. It has improved significantly.   OBJECTIVE IMPAIRMENTS: Abnormal gait, decreased activity tolerance, decreased endurance, decreased mobility, difficulty walking, decreased ROM, decreased strength, and pain.   ACTIVITY LIMITATIONS: carrying, lifting, bending, sitting, standing, squatting, sleeping, stairs, transfers, bed mobility, and locomotion level  PARTICIPATION LIMITATIONS: meal prep, cleaning, laundry, driving, shopping, community activity, occupation, and yard work  PERSONAL FACTORS: 1-2 comorbidities: has had a nerupathy like condition in the past  are also affecting patient's functional outcome.   REHAB POTENTIAL: Excellent  CLINICAL DECISION MAKING: Stable/uncomplicated  EVALUATION COMPLEXITY: Low   GOALS: Goals reviewed with patient? Yes  SHORT TERM GOALS: Target date: 06/23/2022   Patient will be independent with basic exercise program Baseline: Goal status: INITIAL  2.  Patient will be weightbearing as tolerated with walker and progressed off walker as tolerated baseline:  Goal status: INITIAL  3.  Patient will perform all bed mobility independently Baseline:  Goal status:  INITIAL LONG TERM GOALS: Target date: 07/21/2022    Patient will go up and down 5 steps with reciprocal gait in order to get in and out of her house Baseline:  Goal status: INITIAL  2.  Patient will ambulate 3000 feet with no pain or assistive device in order to go shopping Baseline:  Goal status: INITIAL  3.  Patient will stand for 1 hour without self-report of pain in order to return to work Baseline:  Goal status: INITIAL  PLAN:  PT FREQUENCY: 2x/week  PT DURATION: 8 weeks  PLANNED INTERVENTIONS: Therapeutic exercises, Therapeutic activity, Neuromuscular re-education, Balance training, Gait training, Patient/Family education, Self Care, Joint mobilization, Stair training, Aquatic Therapy, Dry Needling, Cryotherapy, Moist heat, Taping, Ultrasound, Manual therapy, and Re-evaluation  PLAN FOR NEXT SESSION: Consider L gastroc of DN. begin some light strengthening exercise as protocol. Consider mini squats and hip extension.    Dessie Coma, PT 07/10/2022, 8:52 AM   Cristal Ford SPT  07/10/2022  During this treatment session, the therapist was present, participating in and directing the treatment.

## 2022-07-14 ENCOUNTER — Encounter (HOSPITAL_BASED_OUTPATIENT_CLINIC_OR_DEPARTMENT_OTHER): Payer: Self-pay | Admitting: Physical Therapy

## 2022-07-16 NOTE — Therapy (Signed)
OUTPATIENT PHYSICAL THERAPY LOWER EXTREMITY Treatment    Patient Name: Sarah Gallagher MRN: [id number] DOB:[birth year], 42 y.o., female Today's Date: 07/17/2022  END OF SESSION:  PT End of Session - 07/17/22 0815     Visit Number 8    Number of Visits 16    Date for PT Re-Evaluation 07/21/22    Authorization Type Gretta Began    PT Start Time 0071    PT Stop Time 0851    PT Time Calculation (min) 44 min    Activity Tolerance Patient tolerated treatment well    Behavior During Therapy [facility] for tasks assessed/performed               Past Medical History:  Diagnosis Date   Anxiety    Arthritis    Chest discomfort    a. associated with palpitations.   Depression    Dyspnea on exertion    a. 12/2016 Echo: EF 55-60%, no rwma, nl PASP.   Juvenile rheumatoid arthritis    Meniere disease    Migraines    Neuropathy    Palpitations    PONV (postoperative nausea and vomiting)    PVC's (premature ventricular contractions)    a. 01/2017 48h Holter: Rare PAC/PVC. Freq runs of sinus tachycardia.   Past Surgical History:  Procedure Laterality Date   GLUTEUS MINIMUS REPAIR Left 05/23/2022   Procedure: LEFT GLUTEUS MEDIUS REPAIR;  Surgeon: Huel Cote, MD;  Location: [facility];  Service: Orthopedics;  Laterality: Left;   KNEE SURGERY Right 1997, 1997   torn meniscus, ACL replacement     REFRACTIVE SURGERY     lasix   Patient Active Problem List   Diagnosis Date Noted   Tendinopathy of gluteus medius 05/23/2022   Encounter for general adult medical examination with abnormal findings 11/30/2021   Attention and concentration deficit 10/18/2021   Tinnitus of both ears 08/25/2021   Imbalance 08/25/2021   Abnormal auditory perception of both ears 08/25/2021   Migraine 10/29/2020   Obesity (BMI 30-39.9) 10/29/2020   Dysplasia of cervix, low grade (CIN 1) 10/16/2018   Chiari I malformation 10/16/2018   Cervical radiculopathy 09/06/2018   Chronic hip pain,  left 09/06/2018   Iron deficiency 09/06/2018   Anxiety disorder 11/27/2017    PCP: Patterson Hammersmith MD   REFERRING PROVIDER: Dr Huel Cote  REFERRING DIAG: L glut med repair   THERAPY DIAG:  Stiffness of left hip, not elsewhere classified  Pain in left hip  Other abnormalities of gait and mobility  Rationale for Evaluation and Treatment: Rehabilitation  ONSET DATE: 05/23/2022  SUBJECTIVE:   SUBJECTIVE STATEMENT: Pt is 7 weeks and 6 days s/p L hip glute med repair.  Pt reports no adverse effects after prior Rx.  Pt states she has tightness in her hip.  Pt states her calf is doing better.  Pt had dry needling last visit, and states she didn't have a big reaction.  Pt worked last weekend and was good until Sunday when she began having pain.  Pt also worked on Wednesday. She states she felt fine that day though had pain the following day.  Pt reports she is able to walk faster now than she could prior to surgery.          PERTINENT HISTORY: L hip glute med repair on 05/23/2022 Juvenile RA right ACL tear and repair 1997, migraines, depression PAIN:  Are you having pain? Yes: NPRS scale: 2/10 today  Pain location: left lateral hip  Pain description: pain can radiate down  Aggravating factors: being upright Relieving factors: rest and propping it up; pain meds  PRECAUTIONS: Other: protocol limitations   WEIGHT BEARING RESTRICTIONS: Yes Advance Wb'ing to full   FALLS:  Has patient fallen in last 6 months? No  LIVING ENVIRONMENT: 5 steps into the house OCCUPATION:  Works in the ER  Hobbies: get back into the gym Lift weights   PLOF: Independent  PATIENT GOALS:   To be able to do activity with less pain   NEXT MD VISIT: 08/18/2022    Days since surgery: 55  OBJECTIVE:   DIAGNOSTIC FINDINGS:  Nothing post op       TODAY'S TREATMENT:                                                                                                                                Pt performed: Upright bike x 5 mins Supine bridges 3x10 SAQ 3x10 1.5# Stool rotations 3x10 Standing marching 2x15 LAQ 2x10 2# Prone HS curl x 10 AROM, 2x10 1.5#  Pt received L hip PROM in flex, abd, ER, and IR per pt tolerance w/n protocol ranges  Manual Therapy:  Pt received STM to L gastoc in prone to improve soft tissue mobility, tightness, and pain.     PATIENT EDUCATION:  Education details: HEP, symptom management; importance of weight bearing.  Person educated: Patient Education method: Explanation, Demonstration, Tactile cues, Verbal cues, and Handouts Education comprehension: verbalized understanding, returned demonstration, verbal cues required, and tactile cues required  HOME EXERCISE PROGRAM: Access Code: [facility] URL: [email or web address] Date: 05/26/2022 Prepared by: Lorayne Bender  Exercises - Supine Quad Set  - 5 x daily - 7 x weekly - 3 sets - 10 reps - Seated Ankle Pumps  - 5 x daily - 7 x weekly - 3 sets - 10 reps  ASSESSMENT:  CLINICAL IMPRESSION: Pt is nearly 8 weeks post op and is progressing well with ROM, function, gait, and tolerance to activity.  She has returned to work and has had some pain.  Pt reports her calf is doing better.  PT performed STM to calf and she does have tightness in L lateral gastroc.  Pt performed exercises per protocol well with cuing for correct form.  She had no c/o's with exercises.  Pt demonstrates good hip ROM at this time in protocol.  She responded well to Rx having no increased pain after Rx.  She should continue to benefit from cont skilled PT services to address ongoing goals, progress per protocol, and assist in restoring PLOF.    OBJECTIVE IMPAIRMENTS: Abnormal gait, decreased activity tolerance, decreased endurance, decreased mobility, difficulty walking, decreased ROM, decreased strength, and pain.   ACTIVITY LIMITATIONS: carrying, lifting, bending, sitting, standing, squatting, sleeping, stairs,  transfers, bed mobility, and locomotion level  PARTICIPATION LIMITATIONS: meal prep, cleaning, laundry, driving, shopping, community activity, occupation, and yard work  PERSONAL FACTORS: 1-2 comorbidities: has had a nerupathy  like condition in the past  are also affecting patient's functional outcome.   REHAB POTENTIAL: Excellent  CLINICAL DECISION MAKING: Stable/uncomplicated  EVALUATION COMPLEXITY: Low   GOALS: Goals reviewed with patient? Yes  SHORT TERM GOALS: Target date: 06/23/2022   Patient will be independent with basic exercise program Baseline: Goal status: INITIAL  2.  Patient will be weightbearing as tolerated with walker and progressed off walker as tolerated baseline:  Goal status: INITIAL  3.  Patient will perform all bed mobility independently Baseline:  Goal status: INITIAL LONG TERM GOALS: Target date: 07/21/2022    Patient will go up and down 5 steps with reciprocal gait in order to get in and out of her house Baseline:  Goal status: INITIAL  2.  Patient will ambulate 3000 feet with no pain or assistive device in order to go shopping Baseline:  Goal status: INITIAL  3.  Patient will stand for 1 hour without self-report of pain in order to return to work Baseline:  Goal status: INITIAL  PLAN:  PT FREQUENCY: 2x/week  PT DURATION: 8 weeks  PLANNED INTERVENTIONS: Therapeutic exercises, Therapeutic activity, Neuromuscular re-education, Balance training, Gait training, Patient/Family education, Self Care, Joint mobilization, Stair training, Aquatic Therapy, Dry Needling, Cryotherapy, Moist heat, Taping, Ultrasound, Manual therapy, and Re-evaluation  PLAN FOR NEXT SESSION: Continue per Dr. Suzan Nailer glute med repair protocol.  Monitor pain and tightness in L gastroc    Audie Clear III PT, DPT 07/17/22 10:07 AM

## 2022-07-17 ENCOUNTER — Encounter (HOSPITAL_BASED_OUTPATIENT_CLINIC_OR_DEPARTMENT_OTHER): Payer: Self-pay | Admitting: Physical Therapy

## 2022-07-17 ENCOUNTER — Ambulatory Visit (HOSPITAL_BASED_OUTPATIENT_CLINIC_OR_DEPARTMENT_OTHER): Payer: 59 | Admitting: Physical Therapy

## 2022-07-17 DIAGNOSIS — M25552 Pain in left hip: Secondary | ICD-10-CM | POA: Diagnosis not present

## 2022-07-17 DIAGNOSIS — R2689 Other abnormalities of gait and mobility: Secondary | ICD-10-CM | POA: Diagnosis not present

## 2022-07-17 DIAGNOSIS — M25652 Stiffness of left hip, not elsewhere classified: Secondary | ICD-10-CM

## 2022-07-21 ENCOUNTER — Encounter (HOSPITAL_BASED_OUTPATIENT_CLINIC_OR_DEPARTMENT_OTHER): Payer: Self-pay | Admitting: Physical Therapy

## 2022-07-24 ENCOUNTER — Encounter (HOSPITAL_BASED_OUTPATIENT_CLINIC_OR_DEPARTMENT_OTHER): Payer: Self-pay

## 2022-07-24 ENCOUNTER — Ambulatory Visit (HOSPITAL_BASED_OUTPATIENT_CLINIC_OR_DEPARTMENT_OTHER): Payer: 59

## 2022-07-24 DIAGNOSIS — R2689 Other abnormalities of gait and mobility: Secondary | ICD-10-CM | POA: Diagnosis not present

## 2022-07-24 DIAGNOSIS — M25552 Pain in left hip: Secondary | ICD-10-CM | POA: Diagnosis not present

## 2022-07-24 DIAGNOSIS — M25652 Stiffness of left hip, not elsewhere classified: Secondary | ICD-10-CM

## 2022-07-24 NOTE — Therapy (Signed)
OUTPATIENT PHYSICAL THERAPY LOWER EXTREMITY Treatment    Patient Name: Sarah Gallagher MRN: [id number] DOB:February 02, 1981, 42 y.o., female Today's Date: 07/24/2022  END OF SESSION:  PT End of Session - 07/24/22 0909     Visit Number 9    Number of Visits 16    Date for PT Re-Evaluation 07/21/22    Authorization Type Gretta Began    PT Start Time 0800    PT Stop Time 0848    PT Time Calculation (min) 48 min    Activity Tolerance Patient tolerated treatment well    Behavior During Therapy [facility] for tasks assessed/performed                Past Medical History:  Diagnosis Date   Anxiety    Arthritis    Chest discomfort    a. associated with palpitations.   Depression    Dyspnea on exertion    a. 12/2016 Echo: EF 55-60%, no rwma, nl PASP.   Juvenile rheumatoid arthritis    Meniere disease    Migraines    Neuropathy    Palpitations    PONV (postoperative nausea and vomiting)    PVC's (premature ventricular contractions)    a. 01/2017 48h Holter: Rare PAC/PVC. Freq runs of sinus tachycardia.   Past Surgical History:  Procedure Laterality Date   GLUTEUS MINIMUS REPAIR Left 05/23/2022   Procedure: LEFT GLUTEUS MEDIUS REPAIR;  Surgeon: Huel Cote, MD;  Location: [facility];  Service: Orthopedics;  Laterality: Left;   KNEE SURGERY Right 1997, 1997   torn meniscus, ACL replacement     REFRACTIVE SURGERY     lasix   Patient Active Problem List   Diagnosis Date Noted   Tendinopathy of gluteus medius 05/23/2022   Encounter for general adult medical examination with abnormal findings 11/30/2021   Attention and concentration deficit 10/18/2021   Tinnitus of both ears 08/25/2021   Imbalance 08/25/2021   Abnormal auditory perception of both ears 08/25/2021   Migraine 10/29/2020   Obesity (BMI 30-39.9) 10/29/2020   Dysplasia of cervix, low grade (CIN 1) 10/16/2018   Chiari I malformation 10/16/2018   Cervical radiculopathy 09/06/2018   Chronic hip  pain, left 09/06/2018   Iron deficiency 09/06/2018   Anxiety disorder 11/27/2017    PCP: Patterson Hammersmith MD   REFERRING PROVIDER: Dr Huel Cote  REFERRING DIAG: L glut med repair   THERAPY DIAG:  Stiffness of left hip, not elsewhere classified  Pain in left hip  Other abnormalities of gait and mobility  Rationale for Evaluation and Treatment: Rehabilitation  ONSET DATE: 05/23/2022  SUBJECTIVE:   SUBJECTIVE STATEMENT: Pt is now over 8 weeks s/p. She was on her feet a lot this weekend and reports mild increase in discomfort and tightness in left hip. 3/10 pain level.       PERTINENT HISTORY: L hip glute med repair on 05/23/2022 Juvenile RA right ACL tear and repair 1997, migraines, depression PAIN:  Are you having pain? Yes: NPRS scale: 3/10 today  Pain location: left lateral hip  Pain description: pain can radiate down  Aggravating factors: being upright Relieving factors: rest and propping it up; pain meds  PRECAUTIONS: Other: protocol limitations   WEIGHT BEARING RESTRICTIONS: Yes Advance Wb'ing to full   FALLS:  Has patient fallen in last 6 months? No  LIVING ENVIRONMENT: 5 steps into the house OCCUPATION:  Works in the ER  Hobbies: get back into the gym Lift weights   PLOF: Independent  PATIENT  GOALS:   To be able to do activity with less pain   NEXT MD VISIT: 08/18/2022    Days since surgery: 62  OBJECTIVE:     OUTPATIENT PHYSICAL THERAPY LOWER EXTREMITY EVALUATION     Patient Name: Sarah Gallagher MRN: [id number] DOB:Jun 15, 1980, 42 y.o., female Today's Date: 05/26/2022   END OF SESSION:         Past Medical History:  Diagnosis Date   Anxiety     Arthritis     Chest discomfort      a. associated with palpitations.   Depression     Dyspnea on exertion      a. 12/2016 Echo: EF 55-60%, no rwma, nl PASP.   Juvenile rheumatoid arthritis (HCC)     Meniere disease     Migraines     Neuropathy     Palpitations     PONV  (postoperative nausea and vomiting)     PVC's (premature ventricular contractions)      a. 01/2017 48h Holter: Rare PAC/PVC. Freq runs of sinus tachycardia.         Past Surgical History:  Procedure Laterality Date   GLUTEUS MINIMUS REPAIR Left 05/23/2022    Procedure: LEFT GLUTEUS MEDIUS REPAIR;  Surgeon: Huel Cote, MD;  Location: [facility];  Service: Orthopedics;  Laterality: Left;   KNEE SURGERY Right 1997, 1997    torn meniscus, ACL replacement     REFRACTIVE SURGERY        lasix        Patient Active Problem List    Diagnosis Date Noted   Tendinopathy of gluteus medius 05/23/2022   Encounter for general adult medical examination with abnormal findings 11/30/2021   Attention and concentration deficit 10/18/2021   Tinnitus of both ears 08/25/2021   Imbalance 08/25/2021   Abnormal auditory perception of both ears 08/25/2021   Migraine 10/29/2020   Overweight 10/29/2020   Dysplasia of cervix, low grade (CIN 1) 10/16/2018   Chiari I malformation (HCC) 10/16/2018   Cervical radiculopathy 09/06/2018   Chronic hip pain, left 09/06/2018   Iron deficiency 09/06/2018   Anxiety disorder 11/27/2017      PCP: Patterson Hammersmith MD    REFERRING PROVIDER: Dr Huel Cote   REFERRING DIAG: L glut med repair    THERAPY DIAG:  No diagnosis found.   Rationale for Evaluation and Treatment: Rehabilitation   ONSET DATE: 05/23/2022   SUBJECTIVE:    SUBJECTIVE STATEMENT: Patient is a 67-year history of left hip pain.  The pain is interfering with her ability to work out and walk distances.  He was also affecting her ability to do certain work tasks.  She was found to have a glut medius tear.  She had this repaired on 05/23/2022.  At this time she is touchdown weightbearing and walking with a rollator walker.  She reports her pain is well-controlled unless she sits for a longer period of time   PERTINENT HISTORY: Juvenile RA right ACL tear and repair 1997, migraines,  depression PAIN:  Are you having pain? Yes: NPRS scale: 6/10 right now 8/10 at worst  Pain location: left anterior hip  Pain description: pain can radiate down  Aggravating factors: being upright Relieving factors: rest and propping it up; pain meds   PRECAUTIONS: Other: protocol limitations    WEIGHT BEARING RESTRICTIONS: Yes TDWB for 2 weeks    FALLS:  Has patient fallen in last 6 months? No   LIVING ENVIRONMENT: 5 steps into the house  OCCUPATION:  Works in the [employer]: get back into the gym Lift weights    PLOF: Independent   PATIENT GOALS:    To be able to do activity with less pain    NEXT MD VISIT:  2 weeks    OBJECTIVE:    DIAGNOSTIC FINDINGS:  Nothing post op      PATIENT SURVEYS:      COGNITION: Overall cognitive status: Within functional limits for tasks assessed                         SENSATION: A little numb on the back side of the incision; pain radiating into her knee and ankle  EDEMA:      MUSCLE LENGTH:   POSTURE: No Significant postural limitations   PALPATION: No unexpected tenderness to palpation    LOWER EXTREMITY ROM:   Passive ROM Right eval Left eval L 4/22  Hip flexion     120 active  Hip extension       Hip abduction     34passive  Hip adduction       Hip internal rotation     29  Hip external rotation     34  Knee flexion       Knee extension       Ankle dorsiflexion       Ankle plantarflexion       Ankle inversion       Ankle eversion        (Blank rows = not tested)   LOWER EXTREMITY MMT:   MMT Right eval Left eval R 4/22  Hip flexion   90 5/5  Hip extension     4-/5  Hip abduction   10 4+/5 seated  Hip adduction   N/A   Hip internal rotation   5 3+/5  Hip external rotation   N/A   Knee flexion       Knee extension       Ankle dorsiflexion       Ankle plantarflexion       Ankle inversion       Ankle eversion        (Blank rows = not tested) not tested 2nd to recent surgery            DIAGNOSTIC FINDINGS:  Nothing post op       TODAY'S TREATMENT:                                                                                                                               Pt performed: Upright bike x 5 mins Supine bridges 2x10 Supine march 2x10 Supine clam with iso hold-GTB 5" 2x10 Sidelying clam 2x10 Stool rotations 3x10 LAQ 2x10 3# 3 sec hold   Pt received L hip PROM in flex, abd, ER, and IR per pt tolerance w/n protocol ranges     PATIENT EDUCATION:  Education  details: HEP, symptom management; importance of weight bearing.  Person educated: Patient Education method: Explanation, Demonstration, Tactile cues, Verbal cues, and Handouts Education comprehension: verbalized understanding, returned demonstration, verbal cues required, and tactile cues required  HOME EXERCISE PROGRAM: Access Code: [facility] URL: [email or web address] Date: 05/26/2022 Prepared by: Lorayne Bender  Exercises - Supine Quad Set  - 5 x daily - 7 x weekly - 3 sets - 10 reps - Seated Ankle Pumps  - 5 x daily - 7 x weekly - 3 sets - 10 reps  ASSESSMENT:  CLINICAL IMPRESSION: Pt is over 8 weeks s/p. She has all STG and 1/3 LTG. She is improving in ROM and can now begin to further progress strength per protocol. Able to initiate isometric and concentric hip abduction today. Pt will benefit from continued PT for strength, stability, and endurance.    OBJECTIVE IMPAIRMENTS: Abnormal gait, decreased activity tolerance, decreased endurance, decreased mobility, difficulty walking, decreased ROM, decreased strength, and pain.   ACTIVITY LIMITATIONS: carrying, lifting, bending, sitting, standing, squatting, sleeping, stairs, transfers, bed mobility, and locomotion level  PARTICIPATION LIMITATIONS: meal prep, cleaning, laundry, driving, shopping, community activity, occupation, and yard work  PERSONAL FACTORS: 1-2 comorbidities: has had a nerupathy like condition in  the past  are also affecting patient's functional outcome.   REHAB POTENTIAL: Excellent  CLINICAL DECISION MAKING: Stable/uncomplicated  EVALUATION COMPLEXITY: Low   GOALS: Goals reviewed with patient? Yes  SHORT TERM GOALS: Target date: 06/23/2022   Patient will be independent with basic exercise program Baseline: Goal status: MET 4/22  2.  Patient will be weightbearing as tolerated with walker and progressed off walker as tolerated baseline:  Goal status: MET 4/22  3.  Patient will perform all bed mobility independently Baseline:  Goal status: MET 4/22 LONG TERM GOALS: Target date: 07/21/2022    Patient will go up and down 5 steps with reciprocal gait in order to get in and out of her house Baseline:  Goal status: IN PROGRESS 4/22  2.  Patient will ambulate 3000 feet with no pain or assistive device in order to go shopping Baseline:  Goal status: IN PROGRESS mild pain after shopping (4/22)  3.  Patient will stand for 1 hour without self-report of pain in order to return to work Baseline:  Goal status: MET 4/22 (returned to work full duty-12 hr shifts)   PLAN:  PT FREQUENCY: 2x/week  PT DURATION: 8 weeks  PLANNED INTERVENTIONS: Therapeutic exercises, Therapeutic activity, Neuromuscular re-education, Balance training, Gait training, Patient/Family education, Self Care, Joint mobilization, Stair training, Aquatic Therapy, Dry Needling, Cryotherapy, Moist heat, Taping, Ultrasound, Manual therapy, and Re-evaluation  PLAN FOR NEXT SESSION: Continue per Dr. Suzan Nailer glute med repair protocol.  Monitor pain and tightness in L gastroc    Riki Altes, PTA  07/24/22 9:37 AM

## 2022-07-28 ENCOUNTER — Encounter (HOSPITAL_BASED_OUTPATIENT_CLINIC_OR_DEPARTMENT_OTHER): Payer: Self-pay | Admitting: Physical Therapy

## 2022-08-01 ENCOUNTER — Encounter (HOSPITAL_BASED_OUTPATIENT_CLINIC_OR_DEPARTMENT_OTHER): Payer: Self-pay | Admitting: Advanced Practice Midwife

## 2022-08-01 ENCOUNTER — Ambulatory Visit (INDEPENDENT_AMBULATORY_CARE_PROVIDER_SITE_OTHER): Payer: 59 | Admitting: Advanced Practice Midwife

## 2022-08-01 ENCOUNTER — Other Ambulatory Visit (HOSPITAL_COMMUNITY)
Admission: RE | Admit: 2022-08-01 | Discharge: 2022-08-01 | Disposition: A | Discharge status: Home / Self Care | Payer: 59 | Source: Ambulatory Visit | Attending: Advanced Practice Midwife | Admitting: Advanced Practice Midwife

## 2022-08-01 VITALS — BP 113/83 | HR 75 | Ht 66.0 in | Wt 189.6 lb

## 2022-08-01 DIAGNOSIS — Z3009 Encounter for other general counseling and advice on contraception: Secondary | ICD-10-CM | POA: Diagnosis not present

## 2022-08-01 DIAGNOSIS — Z01419 Encounter for gynecological examination (general) (routine) without abnormal findings: Secondary | ICD-10-CM

## 2022-08-01 DIAGNOSIS — Z124 Encounter for screening for malignant neoplasm of cervix: Secondary | ICD-10-CM | POA: Insufficient documentation

## 2022-08-01 NOTE — Progress Notes (Signed)
   Subjective:     Sarah Gallagher is a 42 y.o. female here at [facility] for a routine exam.  Current complaints: none.  Personal health questionnaire reviewed: yes.  Do you have a primary care provider? yes Do you feel safe at home? yes  Flowsheet Row Office Visit from 08/01/2022 in [facility] for [facility] Total Score 0       Health Maintenance Due  Topic Date Due   DTaP/Tdap/Td (1 - Tdap) Never done   COVID-19 Vaccine (2 - 2023-24 season) 12/02/2021   PAP SMEAR-Modifier  05/15/2022     Risk factors for chronic health problems: Smoking: Alchohol/how much: Pt BMI: Body mass index is 30.6 kg/m.   Gynecologic History No LMP recorded. Patient has had an injection. Contraception: Depo-Provera injections Last Pap: 05/16/19. Results were: normal Last mammogram: 06/15/20. Results were: normal  Obstetric History OB History  Gravida Para Term Preterm AB Living  3 3 3     3   SAB IAB Ectopic Multiple Live Births          3    # Outcome Date GA Lbr Len/2nd Weight Sex Delivery Anes PTL Lv  3 Term           2 Term           1 Term              The following portions of the patient's history were reviewed and updated as appropriate: allergies, current medications, past family history, past medical history, past social history, past surgical history, and problem list.  Review of Systems Pertinent items noted in HPI and remainder of comprehensive ROS otherwise negative.    Objective:  BP 113/83   Pulse 75   Ht 5\' 6"  (1.676 m)   Wt 189 lb 9.6 oz (86 kg)   BMI 30.60 kg/m   VS reviewed, nursing note reviewed,  Constitutional: well developed, well nourished, no distress HEENT: normocephalic, thyroid without enlargement or mass HEART: RRR, no murmurs rubs/gallops RESP: clear and equal to auscultation bilaterally in all lobes  Breast Exam: exam performed: right breast normal without mass, skin or nipple changes or axillary nodes,  left breast normal without mass, skin or nipple changes or axillary nodes Abdomen: soft Neuro: alert and oriented x 3 Skin: warm, dry Psych: affect normal Pelvic exam:  Performed: Cervix pink, visually closed, without lesion, scant white creamy discharge, vaginal walls and external genitalia normal Bimanual exam: Cervix 0/long/high, firm, anterior, neg CMT, uterus nontender, nonenlarged, adnexa without tenderness, enlargement, or mass        Assessment/Plan:   1. Encounter for annual routine gynecological examination --No gyn concerns or problems   2. Routine cervical smear  - Cytology - PAP( [facility])  3. Encounter for counseling regarding contraception --Likes Depo, has been on for many years. Amenorrhea on Depo which pt likes.  Discussed long term risks of Depo including reduction of bone density. Pt at average risk, caucasian, no known family history. --Will reevaluate at annual exam next year, consider other options, including IUD which was discussed today  --Will continue Depo with PCP    Return in about 1 year (around 08/01/2023) for annual exam.   Sharen Counter, CNM 6:14 PM

## 2022-08-04 LAB — CYTOLOGY - PAP
Comment: NEGATIVE
Diagnosis: UNDETERMINED — AB
High risk HPV: NEGATIVE

## 2022-08-07 ENCOUNTER — Encounter (HOSPITAL_BASED_OUTPATIENT_CLINIC_OR_DEPARTMENT_OTHER): Payer: Self-pay

## 2022-08-07 ENCOUNTER — Ambulatory Visit (HOSPITAL_BASED_OUTPATIENT_CLINIC_OR_DEPARTMENT_OTHER): Payer: 59 | Attending: Orthopaedic Surgery

## 2022-08-07 DIAGNOSIS — M25652 Stiffness of left hip, not elsewhere classified: Secondary | ICD-10-CM | POA: Diagnosis not present

## 2022-08-07 DIAGNOSIS — R2689 Other abnormalities of gait and mobility: Secondary | ICD-10-CM

## 2022-08-07 DIAGNOSIS — M25552 Pain in left hip: Secondary | ICD-10-CM | POA: Insufficient documentation

## 2022-08-07 NOTE — Therapy (Signed)
OUTPATIENT PHYSICAL THERAPY LOWER EXTREMITY Treatment    Patient Name: Sarah Gallagher MRN: [id number] DOB:09/10/1980, 42 y.o., female Today's Date: 08/07/2022  END OF SESSION:  PT End of Session - 08/07/22 0852     Visit Number 10    Number of Visits 16    Date for PT Re-Evaluation 07/21/22    Authorization Type [facility] AETNA    PT Start Time 0800    PT Stop Time 0847    PT Time Calculation (min) 47 min    Activity Tolerance Patient tolerated treatment well    Behavior During Therapy [facility] for tasks assessed/performed                Past Medical History:  Diagnosis Date   Anxiety    Arthritis    Chest discomfort    a. associated with palpitations.   Depression    Dyspnea on exertion    a. 12/2016 Echo: EF 55-60%, no rwma, nl PASP.   Juvenile rheumatoid arthritis (HCC)    Meniere disease    Migraines    Palpitations    PONV (postoperative nausea and vomiting)    PVC's (premature ventricular contractions)    a. 01/2017 48h Holter: Rare PAC/PVC. Freq runs of sinus tachycardia.   Past Surgical History:  Procedure Laterality Date   GLUTEUS MINIMUS REPAIR Left 05/23/2022   Procedure: LEFT GLUTEUS MEDIUS REPAIR;  Surgeon: Huel Cote, MD;  Location: [facility];  Service: Orthopedics;  Laterality: Left;   KNEE SURGERY Right 1997, 1997   torn meniscus, ACL replacement     REFRACTIVE SURGERY     lasix   Patient Active Problem List   Diagnosis Date Noted   Tendinopathy of gluteus medius 05/23/2022   Encounter for general adult medical examination with abnormal findings 11/30/2021   Attention and concentration deficit 10/18/2021   Tinnitus of both ears 08/25/2021   Imbalance 08/25/2021   Abnormal auditory perception of both ears 08/25/2021   Migraine 10/29/2020   Obesity (BMI 30-39.9) 10/29/2020   Dysplasia of cervix, low grade (CIN 1) 10/16/2018   Chiari I malformation (HCC) 10/16/2018   Cervical radiculopathy 09/06/2018   Chronic hip pain,  left 09/06/2018   Iron deficiency 09/06/2018   Anxiety disorder 11/27/2017    PCP: Patterson Hammersmith MD   REFERRING PROVIDER: Dr Huel Cote  REFERRING DIAG: L glut med repair   THERAPY DIAG:  Stiffness of left hip, not elsewhere classified  Other abnormalities of gait and mobility  Pain in left hip  Rationale for Evaluation and Treatment: Rehabilitation  ONSET DATE: 05/23/2022  SUBJECTIVE:   SUBJECTIVE STATEMENT: Pt reports increased pain level after walking her dog up steep hills. Describes 5/10 pain level in anterolateral hip. "Sitting makes it feel stiffer."      PERTINENT HISTORY: L hip glute med repair on 05/23/2022 Juvenile RA right ACL tear and repair 1997, migraines, depression PAIN:  Are you having pain? Yes: NPRS scale: 5/10 today  Pain location: left anterolateral hip  Pain description: Tight and tender Aggravating factors: standing after sitting for prolonged period, walking up hills Relieving factors: moving  PRECAUTIONS: Other: protocol limitations   WEIGHT BEARING RESTRICTIONS: Yes Advance Wb'ing to full   FALLS:  Has patient fallen in last 6 months? No  LIVING ENVIRONMENT: 5 steps into the house OCCUPATION:  Works in the ER  Hobbies: get back into the gym Lift weights   PLOF: Independent  PATIENT GOALS:   To be able to do activity with  less pain   NEXT MD VISIT: 08/18/2022    Days since surgery: 76  OBJECTIVE:    DIAGNOSTIC FINDINGS:  Nothing post op       TODAY'S TREATMENT:                                                                                                                               Pt performed: Upright bike x 5 mins Prone hip extension with knee flexed (foot neutral, ER, and IR) 2x10ea L Supine SLR 2x10L Sidelying hip abd 2x10L Squats 2x10 Piriformis stretch 30sec x2 L Hip flexor lunge stretch- 30sec x3 L HEP update  IASTM and STM to quads/hip flexors with roller    PATIENT EDUCATION:  Education  details: HEP, symptom management; importance of weight bearing.  Person educated: Patient Education method: Explanation, Demonstration, Tactile cues, Verbal cues, and Handouts Education comprehension: verbalized understanding, returned demonstration, verbal cues required, and tactile cues required  HOME EXERCISE PROGRAM: Access Code: [facility] URL: [email or web address] Date: 05/26/2022 Prepared by: Lorayne Bender  Exercises - Supine Quad Set  - 5 x daily - 7 x weekly - 3 sets - 10 reps - Seated Ankle Pumps  - 5 x daily - 7 x weekly - 3 sets - 10 reps  ASSESSMENT:  CLINICAL IMPRESSION: Some tenderness in anterior hip as well as into quads. Utilized IASTM with roller to decrease tightness here. Pt is now 11 weeks s/p and able to progress to hip stretching, so initiated this today with cues to remain within pain limitations. Good tolerance for all progressions. Weakness and fatigue with glute activation/strengthening. Pt will continue to benefit from isolated strengthening to improve her glute facilitation/strength.    OBJECTIVE IMPAIRMENTS: Abnormal gait, decreased activity tolerance, decreased endurance, decreased mobility, difficulty walking, decreased ROM, decreased strength, and pain.   ACTIVITY LIMITATIONS: carrying, lifting, bending, sitting, standing, squatting, sleeping, stairs, transfers, bed mobility, and locomotion level  PARTICIPATION LIMITATIONS: meal prep, cleaning, laundry, driving, shopping, community activity, occupation, and yard work  PERSONAL FACTORS: 1-2 comorbidities: has had a nerupathy like condition in the past  are also affecting patient's functional outcome.   REHAB POTENTIAL: Excellent  CLINICAL DECISION MAKING: Stable/uncomplicated  EVALUATION COMPLEXITY: Low   GOALS: Goals reviewed with patient? Yes  SHORT TERM GOALS: Target date: 06/23/2022   Patient will be independent with basic exercise program Baseline: Goal status: MET  4/22  2.  Patient will be weightbearing as tolerated with walker and progressed off walker as tolerated baseline:  Goal status: MET 4/22  3.  Patient will perform all bed mobility independently Baseline:  Goal status: MET 4/22 LONG TERM GOALS: Target date: 07/21/2022    Patient will go up and down 5 steps with reciprocal gait in order to get in and out of her house Baseline:  Goal status: IN PROGRESS 4/22  2.  Patient will ambulate 3000 feet with no pain or assistive device in order to go shopping Baseline:  Goal  status: IN PROGRESS mild pain after shopping (4/22)  3.  Patient will stand for 1 hour without self-report of pain in order to return to work Baseline:  Goal status: MET 4/22 (returned to work full duty-12 hr shifts)   PLAN:  PT FREQUENCY: 2x/week  PT DURATION: 8 weeks  PLANNED INTERVENTIONS: Therapeutic exercises, Therapeutic activity, Neuromuscular re-education, Balance training, Gait training, Patient/Family education, Self Care, Joint mobilization, Stair training, Aquatic Therapy, Dry Needling, Cryotherapy, Moist heat, Taping, Ultrasound, Manual therapy, and Re-evaluation  PLAN FOR NEXT SESSION: Continue per Dr. Suzan Nailer glute med repair protocol.  Monitor pain and tightness in L gastroc    Aden Youngman, PTA  08/07/22 10:47 AM

## 2022-08-14 ENCOUNTER — Ambulatory Visit (HOSPITAL_BASED_OUTPATIENT_CLINIC_OR_DEPARTMENT_OTHER): Payer: 59

## 2022-08-18 ENCOUNTER — Ambulatory Visit (INDEPENDENT_AMBULATORY_CARE_PROVIDER_SITE_OTHER): Payer: 59 | Admitting: Orthopaedic Surgery

## 2022-08-18 DIAGNOSIS — M67959 Unspecified disorder of synovium and tendon, unspecified thigh: Secondary | ICD-10-CM

## 2022-08-18 NOTE — Progress Notes (Signed)
Post Operative Evaluation    Procedure/Date of Surgery: Left hip gluteus medius repair 05/23/22  Interval History:   Presents today for follow-up of her left hip.  Overall she is continuing to improve.  She is continue to work in physical therapy which is going well.  She is having some soreness predominantly about the medial abductors.   PMH/PSH/Family History/Social History/Meds/Allergies:    Past Medical History:  Diagnosis Date   Anxiety    Arthritis    Chest discomfort    a. associated with palpitations.   Depression    Dyspnea on exertion    a. 12/2016 Echo: EF 55-60%, no rwma, nl PASP.   Juvenile rheumatoid arthritis    Meniere disease    Migraines    Neuropathy    Palpitations    PONV (postoperative nausea and vomiting)    PVC's (premature ventricular contractions)    a. 01/2017 48h Holter: Rare PAC/PVC. Freq runs of sinus tachycardia.   Past Surgical History:  Procedure Laterality Date   GLUTEUS MINIMUS REPAIR Left 05/23/2022   Procedure: LEFT GLUTEUS MEDIUS REPAIR;  Surgeon: Huel Cote, MD;  Location: [facility];  Service: Orthopedics;  Laterality: Left;   KNEE SURGERY Right 1997, 1997   torn meniscus, ACL replacement     REFRACTIVE SURGERY     lasix   Social History   Socioeconomic History   Marital status: Married    Spouse name: ray   Number of children: 3   Years of education: BS   Highest education level: Bachelor's degree (e.g., BA, AB, BS)  Occupational History    Comment: [employer], Resp Therapist  Tobacco Use   Smoking status: Never   Smokeless tobacco: Never  Vaping Use   Vaping Use: Never used  Substance and Sexual Activity   Alcohol use: Not Currently    Alcohol/week: 0.0 standard drinks of alcohol    Comment: rare   Drug use: No   Sexual activity: Yes    Birth control/protection: Injection  Other Topics Concern   Not on file  Social History Narrative   Lives in  [place] with husband and children.  Not currently exercising.  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   Caffeine 2-3 cups daily   Social Determinants of Health   Financial Resource Strain: Not on file  Food Insecurity: Not on file  Transportation Needs: Not on file  Physical Activity: Not on file  Stress: Not on file  Social Connections: Not on file   Family History  Problem Relation Age of Onset   Diabetes Mother    Memory loss Mother    Arthritis Father    Other Father        atrial flutter   Emphysema Father        smoker   Diabetes Maternal Aunt    Stroke Paternal Aunt    Diabetes Maternal Uncle    Arthritis Maternal Grandmother    Diabetes Maternal Grandmother    Cancer Maternal Grandmother        skin   Arthritis Paternal Grandmother    Cancer Paternal Grandmother        skin   Allergies  Allergen Reactions   Zithromax [Azithromycin] Other (See Comments)    "convulsions or tremors"   Current Outpatient Medications  Medication Sig Dispense  Refill   aspirin EC 325 MG tablet Take 1 tablet (325 mg total) by mouth daily. 30 tablet 0   aspirin-acetaminophen-caffeine (EXCEDRIN MIGRAINE) 250-250-65 MG tablet Take by mouth daily.     clonazePAM (KLONOPIN) 0.5 MG tablet Take 0.5 mg by mouth 2 (two) times daily as needed.     medroxyPROGESTERone Acetate 150 MG/ML SUSY INJECT 1 ML INTO THE MUSCLE EVERY 3 MONTHS. 1 mL 3   oxyCODONE (OXY IR/ROXICODONE) 5 MG immediate release tablet Take 1 tablet (5 mg total) by mouth every 4 (four) hours as needed (severe pain). 20 tablet 0   Rimegepant Sulfate (NURTEC) 75 MG TBDP Take 75 mg by mouth every other day. 16 tablet 3   traMADol (ULTRAM) 50 MG tablet Take 1 tablet (50 mg total) by mouth every 12 (twelve) hours as needed for moderate pain or severe pain. 15 tablet 2   No current facility-administered medications for this visit.   No results found.  Review of Systems:   A ROS was performed including pertinent positives and negatives as documented  in the HPI.   Musculoskeletal Exam:    There were no vitals taken for this visit.  Left hip incision is well-appearing.  Range of motion is 30 degrees internal/external rotation of the hip.  She is able to elevate to 120 degrees.  Improved abduction strength.  Distal neurosensory exam is intact  Imaging:      I personally reviewed and interpreted the radiographs.   Assessment:   42 year old female who is 12 weeks status post left gluteus medius repair continue to be well.  At this time she has having some muscular type pain which I do believe should be addressed with some dry needling.  I will plan to see her back in 3 months for reassessment  Plan :    -Return to clinic in 12 weeks for reassessment      I personally saw and evaluated the patient, and participated in the management and treatment plan.  Huel Cote, MD Attending Physician, Orthopedic Surgery  This document was dictated using Dragon voice recognition software. A reasonable attempt at proof reading has been made to minimize errors.

## 2022-08-21 ENCOUNTER — Ambulatory Visit (HOSPITAL_BASED_OUTPATIENT_CLINIC_OR_DEPARTMENT_OTHER): Payer: 59

## 2022-08-21 ENCOUNTER — Encounter (HOSPITAL_BASED_OUTPATIENT_CLINIC_OR_DEPARTMENT_OTHER): Payer: Self-pay

## 2022-08-21 DIAGNOSIS — M25652 Stiffness of left hip, not elsewhere classified: Secondary | ICD-10-CM

## 2022-08-21 DIAGNOSIS — M25552 Pain in left hip: Secondary | ICD-10-CM | POA: Diagnosis not present

## 2022-08-21 DIAGNOSIS — R2689 Other abnormalities of gait and mobility: Secondary | ICD-10-CM

## 2022-08-21 NOTE — Therapy (Signed)
OUTPATIENT PHYSICAL THERAPY LOWER EXTREMITY Treatment    Patient Name: Sarah Gallagher MRN: [id number] DOB:1981/03/20, 42 y.o., female Today's Date: 08/21/2022  END OF SESSION:  PT End of Session - 08/21/22 0814     Visit Number 11    Number of Visits 16    Date for PT Re-Evaluation 07/21/22    Authorization Type [facility]    PT Start Time 4098    PT Stop Time 0845    PT Time Calculation (min) 39 min    Activity Tolerance Patient tolerated treatment well    Behavior During Therapy [facility] for tasks assessed/performed                 Past Medical History:  Diagnosis Date   Anxiety    Arthritis    Chest discomfort    a. associated with palpitations.   Depression    Dyspnea on exertion    a. 12/2016 Echo: EF 55-60%, no rwma, nl PASP.   Juvenile rheumatoid arthritis (HCC)    Meniere disease    Migraines    Palpitations    PONV (postoperative nausea and vomiting)    PVC's (premature ventricular contractions)    a. 01/2017 48h Holter: Rare PAC/PVC. Freq runs of sinus tachycardia.   Past Surgical History:  Procedure Laterality Date   GLUTEUS MINIMUS REPAIR Left 05/23/2022   Procedure: LEFT GLUTEUS MEDIUS REPAIR;  Surgeon: Huel Cote, MD;  Location: [facility];  Service: Orthopedics;  Laterality: Left;   KNEE SURGERY Right 1997, 1997   torn meniscus, ACL replacement     REFRACTIVE SURGERY     lasix   Patient Active Problem List   Diagnosis Date Noted   Tendinopathy of gluteus medius 05/23/2022   Encounter for general adult medical examination with abnormal findings 11/30/2021   Attention and concentration deficit 10/18/2021   Tinnitus of both ears 08/25/2021   Imbalance 08/25/2021   Abnormal auditory perception of both ears 08/25/2021   Migraine 10/29/2020   Obesity (BMI 30-39.9) 10/29/2020   Dysplasia of cervix, low grade (CIN 1) 10/16/2018   Chiari I malformation (HCC) 10/16/2018   Cervical radiculopathy 09/06/2018   Chronic hip  pain, left 09/06/2018   Iron deficiency 09/06/2018   Anxiety disorder 11/27/2017    PCP: Patterson Hammersmith MD   REFERRING PROVIDER: Dr Huel Cote  REFERRING DIAG: L glut med repair   THERAPY DIAG:  Other abnormalities of gait and mobility  Stiffness of left hip, not elsewhere classified  Pain in left hip  Rationale for Evaluation and Treatment: Rehabilitation  ONSET DATE: 05/23/2022  SUBJECTIVE:   SUBJECTIVE STATEMENT: Pt reports continued muscular pain and tenderness throughout L thigh. "He mentioned something about shockwave therapy." She reports tightness and tenderness over lateral hip and throughout length of adductors. 5/10 pain level. Had a busy weekend at work.      PERTINENT HISTORY: L hip glute med repair on 05/23/2022 Juvenile RA right ACL tear and repair 1997, migraines, depression PAIN:  Are you having pain? Yes: NPRS scale: 5/10 today  Pain location: left anterolateral hip  Pain description: Tight and tender Aggravating factors: standing after sitting for prolonged period, walking up hills Relieving factors: moving  PRECAUTIONS: Other: protocol limitations   WEIGHT BEARING RESTRICTIONS: Yes Advance Wb'ing to full   FALLS:  Has patient fallen in last 6 months? No  LIVING ENVIRONMENT: 5 steps into the house OCCUPATION:  Works in the ER  Hobbies: get back into the gym [employer]  PLOF: Independent  PATIENT GOALS:   To be able to do activity with less pain   NEXT MD VISIT: 08/18/2022    Days since surgery: 90  OBJECTIVE:    Active ROM Right eval Left eval Left 5/20  Hip flexion     128deg  Hip extension       Hip abduction       Hip adduction     26deg  Hip internal rotation     21deg (seated)  Hip external rotation     19deg (seated)  Knee flexion       Knee extension       Ankle dorsiflexion       Ankle plantarflexion       Ankle inversion       Ankle eversion        (Blank rows = not tested)   LOWER EXTREMITY MMT:    MMT Right eval Left eval Left 5/20  Hip flexion   90 42.5  Hip extension       Hip abduction   10 32.7  Hip adduction   N/A 26.3  Hip internal rotation   5 19.3  Hip external rotation   N/A 22.6  Knee flexion       Knee extension       Ankle dorsiflexion       Ankle plantarflexion       Ankle inversion       Ankle eversion        (Blank rows = not tested) not tested 2nd to recent surgery     DIAGNOSTIC FINDINGS:  Nothing post op       TODAY'S TREATMENT:                                                                                                                              Updated objective measurements and reviewed goals Upright bike x 5 mins Prone hip extension with knee flexed x20L Sidelying hip abd 2x10L Squats 2x10 Piriformis stretch 30sec x2 L manual  STM to adductors and lateral hip    PATIENT EDUCATION:  Education details: HEP, symptom management; importance of weight bearing.  Person educated: Patient Education method: Explanation, Demonstration, Tactile cues, Verbal cues, and Handouts Education comprehension: verbalized understanding, returned demonstration, verbal cues required, and tactile cues required  HOME EXERCISE PROGRAM: Access Code: [facility] URL: [email or web address] Date: 05/26/2022 Prepared by: Lorayne Bender  Exercises - Supine Quad Set  - 5 x daily - 7 x weekly - 3 sets - 10 reps - Seated Ankle Pumps  - 5 x daily - 7 x weekly - 3 sets - 10 reps  ASSESSMENT:  CLINICAL IMPRESSION: FOTO performed with increased score of 66%, pt has met all STG and 1/3 LTG. She demonstrates some hip weakness in IR/ER and ab/add. Strong with hip flexion. Good tolerance for PROM and gentle stretching of L hip. Pt able to climb stairs without pain, but does become  quickly fatigued in glutes. Pt will benefit from continued strengthening to improve ability for functional tasks. MD wouldl like to try dry needling to address her continued  muscular tightness and discomfort.    OBJECTIVE IMPAIRMENTS: Abnormal gait, decreased activity tolerance, decreased endurance, decreased mobility, difficulty walking, decreased ROM, decreased strength, and pain.   ACTIVITY LIMITATIONS: carrying, lifting, bending, sitting, standing, squatting, sleeping, stairs, transfers, bed mobility, and locomotion level  PARTICIPATION LIMITATIONS: meal prep, cleaning, laundry, driving, shopping, community activity, occupation, and yard work  PERSONAL FACTORS: 1-2 comorbidities: has had a nerupathy like condition in the past  are also affecting patient's functional outcome.   REHAB POTENTIAL: Excellent  CLINICAL DECISION MAKING: Stable/uncomplicated  EVALUATION COMPLEXITY: Low   GOALS: Goals reviewed with patient? Yes  SHORT TERM GOALS: Target date: 06/23/2022   Patient will be independent with basic exercise program Baseline: Goal status: MET 4/22  2.  Patient will be weightbearing as tolerated with walker and progressed off walker as tolerated baseline:  Goal status: MET 4/22  3.  Patient will perform all bed mobility independently Baseline:  Goal status: MET 4/22 LONG TERM GOALS: Target date: 07/21/2022    Patient will go up and down 5 steps with reciprocal gait in order to get in and out of her house Baseline:  Goal status: IN PROGRESS 5/20 (has difficulty with ascending stairs)  2.  Patient will ambulate 3000 feet with no pain or assistive device in order to go shopping Baseline:  Goal status: IN PROGRESS mild pain after back to back work days- 5/20  3.  Patient will stand for 1 hour without self-report of pain in order to return to work Baseline:  Goal status: MET 4/22 (returned to work full duty-12 hr shifts)   PLAN:  PT FREQUENCY: 2x/week  PT DURATION: 8 weeks  PLANNED INTERVENTIONS: Therapeutic exercises, Therapeutic activity, Neuromuscular re-education, Balance training, Gait training, Patient/Family education, Self  Care, Joint mobilization, Stair training, Aquatic Therapy, Dry Needling, Cryotherapy, Moist heat, Taping, Ultrasound, Manual therapy, and Re-evaluation  PLAN FOR NEXT SESSION: Continue per Dr. Suzan Nailer glute med repair protocol.  Monitor pain and tightness in L gastroc    Denorris Reust, PTA  08/21/22 8:53 AM

## 2022-08-30 ENCOUNTER — Encounter (HOSPITAL_BASED_OUTPATIENT_CLINIC_OR_DEPARTMENT_OTHER): Payer: Self-pay | Admitting: Physical Therapy

## 2022-08-30 ENCOUNTER — Ambulatory Visit (HOSPITAL_BASED_OUTPATIENT_CLINIC_OR_DEPARTMENT_OTHER): Payer: 59 | Admitting: Physical Therapy

## 2022-08-30 DIAGNOSIS — M25652 Stiffness of left hip, not elsewhere classified: Secondary | ICD-10-CM | POA: Diagnosis not present

## 2022-08-30 DIAGNOSIS — R2689 Other abnormalities of gait and mobility: Secondary | ICD-10-CM

## 2022-08-30 DIAGNOSIS — M25552 Pain in left hip: Secondary | ICD-10-CM

## 2022-08-30 NOTE — Therapy (Signed)
OUTPATIENT PHYSICAL THERAPY LOWER EXTREMITY Treatment/progress note   Patient Name: Sarah Gallagher MRN: [id number] DOB:May 25, 1980, 42 y.o., female Today's Date: 08/21/2022  END OF SESSION:  PT End of Session - 08/21/22 0814     Visit Number 11    Number of Visits 16    Date for PT Re-Evaluation 07/21/22    Authorization Type Gretta Began    PT Start Time 4098    PT Stop Time 0845    PT Time Calculation (min) 39 min    Activity Tolerance Patient tolerated treatment well    Behavior During Therapy [facility] for tasks assessed/performed                 Past Medical History:  Diagnosis Date   Anxiety    Arthritis    Chest discomfort    a. associated with palpitations.   Depression    Dyspnea on exertion    a. 12/2016 Echo: EF 55-60%, no rwma, nl PASP.   Juvenile rheumatoid arthritis (HCC)    Meniere disease    Migraines    Palpitations    PONV (postoperative nausea and vomiting)    PVC's (premature ventricular contractions)    a. 01/2017 48h Holter: Rare PAC/PVC. Freq runs of sinus tachycardia.   Past Surgical History:  Procedure Laterality Date   GLUTEUS MINIMUS REPAIR Left 05/23/2022   Procedure: LEFT GLUTEUS MEDIUS REPAIR;  Surgeon: Huel Cote, MD;  Location: [facility];  Service: Orthopedics;  Laterality: Left;   KNEE SURGERY Right 1997, 1997   torn meniscus, ACL replacement     REFRACTIVE SURGERY     lasix   Patient Active Problem List   Diagnosis Date Noted   Tendinopathy of gluteus medius 05/23/2022   Encounter for general adult medical examination with abnormal findings 11/30/2021   Attention and concentration deficit 10/18/2021   Tinnitus of both ears 08/25/2021   Imbalance 08/25/2021   Abnormal auditory perception of both ears 08/25/2021   Migraine 10/29/2020   Obesity (BMI 30-39.9) 10/29/2020   Dysplasia of cervix, low grade (CIN 1) 10/16/2018   Chiari I malformation (HCC) 10/16/2018   Cervical radiculopathy 09/06/2018    Chronic hip pain, left 09/06/2018   Iron deficiency 09/06/2018   Anxiety disorder 11/27/2017    PCP: Patterson Hammersmith MD   REFERRING PROVIDER: Dr Huel Cote  REFERRING DIAG: L glut med repair   THERAPY DIAG:  Other abnormalities of gait and mobility  Stiffness of left hip, not elsewhere classified  Pain in left hip  Rationale for Evaluation and Treatment: Rehabilitation  ONSET DATE: 05/23/2022  SUBJECTIVE:   SUBJECTIVE STATEMENT: The patient reports is does well sometimes and sometimes gets sore.  She has increased pain when she has to stand for long periods of time at work.  Most of her pain is in her lateral glutes and sometimes into her right anterior thigh.  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     PERTINENT HISTORY: L hip glute med repair on 05/23/2022 Juvenile RA right ACL tear and repair 1997, migraines, depression PAIN:  Are you having pain? Yes: NPRS scale: 5/10 today  Pain location: left anterolateral hip  Pain description: Tight and tender Aggravating factors: standing after sitting for prolonged period, walking up hills Relieving factors: moving  PRECAUTIONS: Other: protocol limitations   WEIGHT BEARING RESTRICTIONS: Yes Advance Wb'ing to full   FALLS:  Has patient fallen in last 6 months? No  LIVING ENVIRONMENT: 5 steps into  the house OCCUPATION:  Works in the ER  Hobbies: get back into the gym Lift weights   PLOF: Independent  PATIENT GOALS:   To be able to do activity with less pain   NEXT MD VISIT: 08/18/2022    Days since surgery: 90  OBJECTIVE:    Active ROM Right eval Left eval Left 5/20  Hip flexion     128deg  Hip extension       Hip abduction       Hip adduction     26deg  Hip internal rotation     21deg (seated)  Hip external rotation     19deg (seated)  Knee flexion       Knee extension       Ankle dorsiflexion       Ankle plantarflexion       Ankle inversion       Ankle eversion         (Blank rows = not tested)   LOWER EXTREMITY MMT:   MMT Right eval Left eval Left 5/20 right  Hip flexion   90 42.5 40.4  Hip extension        Hip abduction   10 32.7 37.8  Hip adduction   N/A 26.3   Hip internal rotation   5 19.3   Hip external rotation   N/A 22.6   Knee flexion        Knee extension     35.6 43.6  Ankle dorsiflexion        Ankle plantarflexion        Ankle inversion        Ankle eversion         (Blank rows = not tested) not tested 2nd to recent surgery     DIAGNOSTIC FINDINGS:  Nothing post op       TODAY'S TREATMENT:                                                                                                                              5/29 Reviewed measurements from last visit and the percentage of strength that she is working on. Reviewed her FOTO score with her.   Ex bike 5 min  Leg press 3x12 RPE of 6 at 70 lbs RPE of 2 at 50 lbs  LF knee extension machine 3x15 20 lbs  Hip abduction machine 3 x 15  Bridge 2 x 15          Last visit   Updated objective measurements and reviewed goals Upright bike x 5 mins Prone hip extension with knee flexed x20L Sidelying hip abd 2x10L Squats 2x10 Piriformis stretch 30sec x2 L manual  STM to adductors and lateral hip    PATIENT EDUCATION:  Education details: HEP, symptom management; importance of weight bearing.  Person educated: Patient Education method: Explanation, Demonstration, Tactile cues, Verbal cues, and Handouts Education comprehension: verbalized understanding, returned demonstration, verbal cues required, and tactile cues  required  HOME EXERCISE PROGRAM: Access Code: [facility] URL: [email or web address] Date: 05/26/2022 Prepared by: Lorayne Bender  Exercises - Supine Quad Set  - 5 x daily - 7 x weekly - 3 sets - 10 reps - Seated Ankle Pumps  - 5 x daily - 7 x weekly - 3 sets - 10 reps  ASSESSMENT:  CLINICAL IMPRESSION: Patient is making good progress.   She continues to have some pain but she also continues to have some glutes weakness which would be expected.  We reviewed gym exercises that she can do with the [employer].  Time may be a factor in her ability to be able to get to the gym.  We also reviewed her exercises that she can continue to do at home.  She has 2 more visits left.  Will continue over the next 2 visits to expand her gym program in her home program.  She has better Foto goal.  She is also still having difficulty going downstairs.  We will address next visit program that she can use to improve her ability to go downstairs.  OBJECTIVE IMPAIRMENTS: Abnormal gait, decreased activity tolerance, decreased endurance, decreased mobility, difficulty walking, decreased ROM, decreased strength, and pain.   ACTIVITY LIMITATIONS: carrying, lifting, bending, sitting, standing, squatting, sleeping, stairs, transfers, bed mobility, and locomotion level  PARTICIPATION LIMITATIONS: meal prep, cleaning, laundry, driving, shopping, community activity, occupation, and yard work  PERSONAL FACTORS: 1-2 comorbidities: has had a nerupathy like condition in the past  are also affecting patient's functional outcome.   REHAB POTENTIAL: Excellent  CLINICAL DECISION MAKING: Stable/uncomplicated  EVALUATION COMPLEXITY: Low   GOALS: Goals reviewed with patient? Yes  SHORT TERM GOALS: Target date: 06/23/2022   Patient will be independent with basic exercise program Baseline: Goal status: MET 4/22  2.  Patient will be weightbearing as tolerated with walker and progressed off walker as tolerated baseline:  Goal status: MET 4/22  3.  Patient will perform all bed mobility independently Baseline:  Goal status: MET 4/22 LONG TERM GOALS: Target date: 07/21/2022    Patient will go up and down 5 steps with reciprocal gait in order to get in and out of her house Baseline:  Goal status: IN PROGRESS 5/20 (has difficulty with ascending stairs)  2.  Patient  will ambulate 3000 feet with no pain or assistive device in order to go shopping Baseline:  Goal status: IN PROGRESS mild pain after back to back work days- 5/ 28  3.  Patient will stand for 1 hour without self-report of pain in order to return to work Baseline:  Goal status: MET 4/22 (returned to work full duty-12 hr shifts)   PLAN:  PT FREQUENCY: 2x/week  PT DURATION: 8 weeks  PLANNED INTERVENTIONS: Therapeutic exercises, Therapeutic activity, Neuromuscular re-education, Balance training, Gait training, Patient/Family education, Self Care, Joint mobilization, Stair training, Aquatic Therapy, Dry Needling, Cryotherapy, Moist heat, Taping, Ultrasound, Manual therapy, and Re-evaluation  PLAN FOR NEXT SESSION: Continue per Dr. Suzan Nailer glute med repair protocol.  Monitor pain and tightness in L gastroc    Lorayne Bender PT DPT   08/21/22 8:53 AM

## 2022-08-31 ENCOUNTER — Encounter (HOSPITAL_BASED_OUTPATIENT_CLINIC_OR_DEPARTMENT_OTHER): Payer: Self-pay | Admitting: Physical Therapy

## 2022-09-04 ENCOUNTER — Encounter (HOSPITAL_BASED_OUTPATIENT_CLINIC_OR_DEPARTMENT_OTHER): Payer: 59 | Admitting: Physical Therapy

## 2022-09-11 ENCOUNTER — Other Ambulatory Visit: Payer: Self-pay | Admitting: Internal Medicine

## 2022-09-11 ENCOUNTER — Other Ambulatory Visit (HOSPITAL_BASED_OUTPATIENT_CLINIC_OR_DEPARTMENT_OTHER): Payer: Self-pay

## 2022-09-11 DIAGNOSIS — G43009 Migraine without aura, not intractable, without status migrainosus: Secondary | ICD-10-CM

## 2022-09-11 DIAGNOSIS — G8929 Other chronic pain: Secondary | ICD-10-CM

## 2022-09-11 MED ORDER — TRAMADOL HCL 50 MG PO TABS
50.0000 mg | ORAL_TABLET | Freq: Two times a day (BID) | ORAL | 2 refills | Status: DC | PRN
Start: 2022-09-11 — End: 2023-07-17
  Filled 2022-09-11: qty 15, 8d supply, fill #0
  Filled 2023-01-10: qty 15, 8d supply, fill #1
  Filled 2023-03-09: qty 15, 8d supply, fill #2

## 2022-09-11 MED ORDER — NURTEC 75 MG PO TBDP
75.0000 mg | ORAL_TABLET | ORAL | 3 refills | Status: DC
Start: 2022-09-11 — End: 2023-03-09
  Filled 2022-09-11: qty 16, 32d supply, fill #0
  Filled 2022-11-10: qty 16, 32d supply, fill #1
  Filled 2022-12-09: qty 16, 32d supply, fill #2
  Filled 2023-01-10: qty 16, 32d supply, fill #3

## 2022-09-12 ENCOUNTER — Encounter (HOSPITAL_BASED_OUTPATIENT_CLINIC_OR_DEPARTMENT_OTHER): Payer: 59 | Admitting: Physical Therapy

## 2022-09-18 ENCOUNTER — Encounter (HOSPITAL_BASED_OUTPATIENT_CLINIC_OR_DEPARTMENT_OTHER): Payer: 59 | Admitting: Physical Therapy

## 2022-09-25 ENCOUNTER — Ambulatory Visit: Payer: 59 | Admitting: Internal Medicine

## 2022-09-28 ENCOUNTER — Other Ambulatory Visit (HOSPITAL_BASED_OUTPATIENT_CLINIC_OR_DEPARTMENT_OTHER): Payer: Self-pay

## 2022-09-29 ENCOUNTER — Other Ambulatory Visit (HOSPITAL_BASED_OUTPATIENT_CLINIC_OR_DEPARTMENT_OTHER): Payer: Self-pay

## 2022-09-29 ENCOUNTER — Encounter (HOSPITAL_BASED_OUTPATIENT_CLINIC_OR_DEPARTMENT_OTHER): Payer: 59 | Admitting: Orthopaedic Surgery

## 2022-10-02 ENCOUNTER — Ambulatory Visit (INDEPENDENT_AMBULATORY_CARE_PROVIDER_SITE_OTHER): Payer: 59 | Admitting: Internal Medicine

## 2022-10-02 ENCOUNTER — Other Ambulatory Visit (HOSPITAL_BASED_OUTPATIENT_CLINIC_OR_DEPARTMENT_OTHER): Payer: Self-pay

## 2022-10-02 VITALS — BP 119/79 | HR 76 | Ht 65.0 in | Wt 190.0 lb

## 2022-10-02 DIAGNOSIS — M67959 Unspecified disorder of synovium and tendon, unspecified thigh: Secondary | ICD-10-CM | POA: Diagnosis not present

## 2022-10-02 DIAGNOSIS — G43009 Migraine without aura, not intractable, without status migrainosus: Secondary | ICD-10-CM | POA: Diagnosis not present

## 2022-10-02 DIAGNOSIS — M722 Plantar fascial fibromatosis: Secondary | ICD-10-CM | POA: Insufficient documentation

## 2022-10-02 DIAGNOSIS — F411 Generalized anxiety disorder: Secondary | ICD-10-CM | POA: Diagnosis not present

## 2022-10-02 MED ORDER — ALPRAZOLAM 0.5 MG PO TABS
0.5000 mg | ORAL_TABLET | Freq: Two times a day (BID) | ORAL | 1 refills | Status: AC | PRN
Start: 2022-10-02 — End: ?
  Filled 2022-10-02: qty 30, 15d supply, fill #0

## 2022-10-02 MED ORDER — NAPROXEN 500 MG PO TABS
500.0000 mg | ORAL_TABLET | Freq: Two times a day (BID) | ORAL | 1 refills | Status: AC
Start: 2022-10-02 — End: ?
  Filled 2022-10-02: qty 30, 15d supply, fill #0
  Filled 2023-01-10: qty 30, 15d supply, fill #1

## 2022-10-02 NOTE — Progress Notes (Unsigned)
Established Patient Office Visit  Subjective:  Patient ID: Sarah Gallagher, female    DOB: 12-19-1980  Age: 42 y.o. MRN: [id number]  CC:  Chief Complaint  Patient presents with   ADD    Four month follow up     HPI Sarah Gallagher is a 42 y.o. female with past medical history of migraine, chronic hip pain and depression with anxiety who presents for f/u of her chronic medical conditions.  She recently had left gluteus medius repair surgery.  She is undergoing PT. She is using walker for support currently.  She has mild tingling in the left buttock area.  Denies any numbness or tingling of the foot.  She still has spells of anxiety and decreased concentration.  She has seen psychiatrist for evaluation of anxiety and possible ADD.  She was referred for neuropsych evaluation. Her sons are on Focalin for ADD currently.   Past Medical History:  Diagnosis Date   Anxiety    Arthritis    Chest discomfort    a. associated with palpitations.   Depression    Dyspnea on exertion    a. 12/2016 Echo: EF 55-60%, no rwma, nl PASP.   Juvenile rheumatoid arthritis (HCC)    Meniere disease    Migraines    Palpitations    PONV (postoperative nausea and vomiting)    PVC's (premature ventricular contractions)    a. 01/2017 48h Holter: Rare PAC/PVC. Freq runs of sinus tachycardia.    Past Surgical History:  Procedure Laterality Date   GLUTEUS MINIMUS REPAIR Left 05/23/2022   Procedure: LEFT GLUTEUS MEDIUS REPAIR;  Surgeon: Huel Cote, MD;  Location: [facility];  Service: Orthopedics;  Laterality: Left;   KNEE SURGERY Right 1997, 1997   torn meniscus, ACL replacement     REFRACTIVE SURGERY     lasix    Family History  Problem Relation Age of Onset   Arthritis Paternal Grandmother    Cancer Paternal Grandmother        skin   Arthritis Maternal Grandmother    Diabetes Maternal Grandmother    Cancer Maternal Grandmother        skin   Arthritis Father    Other Father         atrial flutter   Emphysema Father        smoker   Diabetes Mother    Memory loss Mother    Diabetes Maternal Aunt    Diabetes Maternal Uncle    Stroke Paternal Aunt     Social History   Socioeconomic History   Marital status: Married    Spouse name: ray   Number of children: 3   Years of education: BS   Highest education level: Bachelor's degree (e.g., BA, AB, BS)  Occupational History    Comment: [employer], Resp Therapist  Tobacco Use   Smoking status: Never   Smokeless tobacco: Never  Vaping Use   Vaping Use: Never used  Substance and Sexual Activity   Alcohol use: Not Currently    Alcohol/week: 0.0 standard drinks of alcohol    Comment: rare   Drug use: No   Sexual activity: Yes    Birth control/protection: Injection  Other Topics Concern   Not on file  Social History Narrative   Lives in [place] with husband and children.  Not currently exercising.  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   Caffeine 2-3 cups daily   Social Determinants of Health   Financial Resource Strain:  Low Risk  (10/02/2022)   Overall Financial Resource Strain (CARDIA)    Difficulty of Paying Living Expenses: Not very hard  Food Insecurity: No Food Insecurity (10/02/2022)   Hunger Vital Sign    Worried About Running Out of Food in the Last Year: Never true    Ran Out of Food in the Last Year: Never true  Transportation Needs: No Transportation Needs (10/02/2022)   PRAPARE - Administrator, Civil Service (Medical): No    Lack of Transportation (Non-Medical): No  Physical Activity: Unknown (10/02/2022)   Exercise Vital Sign    Days of Exercise per Week: 0 days    Minutes of Exercise per Session: Not on file  Stress: Stress Concern Present (10/02/2022)   [employer] of Occupational Health - Occupational Stress Questionnaire    Feeling of Stress : To some extent  Social Connections: Socially Integrated (10/02/2022)   Social Connection and Isolation Panel [NHANES]    Frequency of  Communication with Friends and Family: More than three times a week    Frequency of Social Gatherings with Friends and Family: Once a week    Attends Religious Services: More than 4 times per year    Active Member of [employer] or Organizations: Yes    Attends Engineer, structural: More than 4 times per year    Marital Status: Married  Catering manager Violence: Not on file    Outpatient Medications Prior to Visit  Medication Sig Dispense Refill   clonazePAM (KLONOPIN) 0.5 MG tablet Take 0.5 mg by mouth 2 (two) times daily as needed. (Patient not taking: Reported on 08/01/2022)     medroxyPROGESTERone Acetate 150 MG/ML SUSY INJECT 1 ML INTO THE MUSCLE EVERY 3 MONTHS. 1 mL 3   Rimegepant Sulfate (NURTEC) 75 MG TBDP Take 1 tablet (75 mg) by mouth every other day. 16 tablet 3   traMADol (ULTRAM) 50 MG tablet Take 1 tablet (50 mg total) by mouth every 12 (twelve) hours as needed for moderate pain or severe pain. 15 tablet 2   No facility-administered medications prior to visit.    Allergies  Allergen Reactions   Zithromax [Azithromycin] Other (See Comments)    "convulsions or tremors"    ROS Review of Systems  Constitutional:  Negative for chills and fever.  HENT:  Negative for congestion, sinus pressure, sinus pain and sore throat.   Eyes:  Negative for pain and discharge.  Respiratory:  Negative for cough and shortness of breath.   Cardiovascular:  Negative for chest pain and palpitations.  Gastrointestinal:  Negative for abdominal pain, diarrhea, nausea and vomiting.  Endocrine: Negative for polydipsia and polyuria.  Genitourinary:  Negative for dysuria and hematuria.  Musculoskeletal:  Positive for arthralgias (L hip and knee). Negative for neck pain and neck stiffness.  Skin:  Negative for rash.  Neurological:  Positive for headaches. Negative for dizziness and weakness.  Psychiatric/Behavioral:  Negative for agitation and behavioral problems. The patient is nervous/anxious.  The patient is not hyperactive.       Objective:    Physical Exam Vitals reviewed.  Constitutional:      General: She is not in acute distress.    Appearance: She is not diaphoretic.     Comments: Has a rolling walker  HENT:     Head: Normocephalic and atraumatic.     Nose: Nose normal. No congestion.     Mouth/Throat:     Mouth: Mucous membranes are moist.     Pharynx: No posterior  oropharyngeal erythema.  Eyes:     General: No scleral icterus.    Extraocular Movements: Extraocular movements intact.  Cardiovascular:     Rate and Rhythm: Normal rate and regular rhythm.     Heart sounds: Normal heart sounds. No murmur heard. Pulmonary:     Breath sounds: Normal breath sounds. No wheezing or rales.  Musculoskeletal:     Cervical back: Neck supple. No tenderness.     Right lower leg: No edema.     Left lower leg: No edema.  Skin:    General: Skin is warm.     Findings: No rash.  Neurological:     General: No focal deficit present.     Mental Status: She is alert and oriented to person, place, and time.  Psychiatric:        Mood and Affect: Mood normal.        Behavior: Behavior normal.     BP 119/79 (BP Location: Right Arm, Patient Position: Sitting, Cuff Size: Normal)   Pulse 76   Ht 5\' 5"  (1.651 m)   Wt 190 lb (86.2 kg)   SpO2 97%   BMI 31.62 kg/m  Wt Readings from Last 3 Encounters:  10/02/22 190 lb (86.2 kg)  08/01/22 189 lb 9.6 oz (86 kg)  06/01/22 191 lb 9.6 oz (86.9 kg)    Lab Results  Component Value Date   TSH 1.990 11/23/2021   Lab Results  Component Value Date   WBC 6.2 11/23/2021   HGB 13.0 11/23/2021   HCT 39.8 11/23/2021   MCV 92 11/23/2021   PLT 184 11/23/2021   Lab Results  Component Value Date   NA 143 11/23/2021   K 4.4 11/23/2021   CO2 21 11/23/2021   GLUCOSE 84 11/23/2021   BUN 15 11/23/2021   CREATININE 0.95 11/23/2021   BILITOT 0.7 11/23/2021   ALKPHOS 68 11/23/2021   AST 17 11/23/2021   ALT 15 11/23/2021   PROT 6.5  11/23/2021   ALBUMIN 4.3 11/23/2021   CALCIUM 9.4 11/23/2021   ANIONGAP 11 05/23/2019   EGFR 77 11/23/2021   GFR 62.94 07/12/2017   Lab Results  Component Value Date   CHOL 125 11/23/2021   Lab Results  Component Value Date   HDL 38 (L) 11/23/2021   Lab Results  Component Value Date   LDLCALC 77 11/23/2021   Lab Results  Component Value Date   TRIG 42 11/23/2021   Lab Results  Component Value Date   CHOLHDL 3.3 11/23/2021   Lab Results  Component Value Date   HGBA1C 5.6 11/23/2021      Assessment & Plan:   Problem List Items Addressed This Visit   None  No orders of the defined types were placed in this encounter.   Follow-up: No follow-ups on file.    Anabel Halon, MD

## 2022-10-02 NOTE — Assessment & Plan Note (Addendum)
She had concern for ADHD Referred to psychiatry - had neuropsych evaluation, but awaiting final  evaluation for ADD She has not tolerated multiple antidepressants in the past, including Lexapro, Effexor, BuSpar etc. She prefers to continue relaxation techniques at home for now Xanax as needed for panic episode

## 2022-10-02 NOTE — Patient Instructions (Addendum)
Please take Naproxen as needed for foot pain.  Please perform leg elevation as tolerated. Apply ice over the foot area.  Okay to apply plantar fasciitis splint at nighttime.  Please continue to take medications as prescribed.  Please continue to follow low carb diet and perform moderate exercise/walking at least 150 mins/week.

## 2022-10-02 NOTE — Assessment & Plan Note (Signed)
S/p gluteus medius repair surgery Undergoing PT Followed by orthopedic surgeon

## 2022-10-04 ENCOUNTER — Ambulatory Visit (INDEPENDENT_AMBULATORY_CARE_PROVIDER_SITE_OTHER): Payer: 59 | Admitting: Orthopaedic Surgery

## 2022-10-04 DIAGNOSIS — M67959 Unspecified disorder of synovium and tendon, unspecified thigh: Secondary | ICD-10-CM

## 2022-10-04 NOTE — Assessment & Plan Note (Signed)
Well controlled with Nurtec as needed °

## 2022-10-04 NOTE — Assessment & Plan Note (Signed)
Her heel pain is likely due to plantars fasciitis Naproxen as needed for foot pain Rest, ice and leg elevation advised If persistent, will refer to podiatry

## 2022-10-04 NOTE — Progress Notes (Signed)
Post Operative Evaluation    Procedure/Date of Surgery: Left hip gluteus medius repair 05/23/22  Interval History:   Presents today for follow-up of her left hip.  Overall she is continuing to improve.  She does have some soreness when standing up for the first time for the first 10-15 steps.  She is having a hard time doing her home exercise program due to her longer work hours.   PMH/PSH/Family History/Social History/Meds/Allergies:    Past Medical History:  Diagnosis Date   Anxiety    Arthritis    Chest discomfort    a. associated with palpitations.   Depression    Dyspnea on exertion    a. 12/2016 Echo: EF 55-60%, no rwma, nl PASP.   Juvenile rheumatoid arthritis (HCC)    Meniere disease    Migraines    Palpitations    PONV (postoperative nausea and vomiting)    PVC's (premature ventricular contractions)    a. 01/2017 48h Holter: Rare PAC/PVC. Freq runs of sinus tachycardia.   Past Surgical History:  Procedure Laterality Date   GLUTEUS MINIMUS REPAIR Left 05/23/2022   Procedure: LEFT GLUTEUS MEDIUS REPAIR;  Surgeon: Huel Cote, MD;  Location: [facility];  Service: Orthopedics;  Laterality: Left;   KNEE SURGERY Right 1997, 1997   torn meniscus, ACL replacement     REFRACTIVE SURGERY     lasix   Social History   Socioeconomic History   Marital status: Married    Spouse name: ray   Number of children: 3   Years of education: BS   Highest education level: Bachelor's degree (e.g., BA, AB, BS)  Occupational History    Comment: [employer], Resp Therapist  Tobacco Use   Smoking status: Never   Smokeless tobacco: Never  Vaping Use   Vaping Use: Never used  Substance and Sexual Activity   Alcohol use: Not Currently    Alcohol/week: 0.0 standard drinks of alcohol    Comment: rare   Drug use: No   Sexual activity: Yes    Birth control/protection: Injection  Other Topics Concern   Not on file   Social History Narrative   Lives in [place] with husband and children.  Not currently exercising.  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   Caffeine 2-3 cups daily   Social Determinants of Health   Financial Resource Strain: Low Risk  (10/02/2022)   Overall Financial Resource Strain (CARDIA)    Difficulty of Paying Living Expenses: Not very hard  Food Insecurity: No Food Insecurity (10/02/2022)   Hunger Vital Sign    Worried About Running Out of Food in the Last Year: Never true    Ran Out of Food in the Last Year: Never true  Transportation Needs: No Transportation Needs (10/02/2022)   PRAPARE - Administrator, Civil Service (Medical): No    Lack of Transportation (Non-Medical): No  Physical Activity: Unknown (10/02/2022)   Exercise Vital Sign    Days of Exercise per Week: 0 days    Minutes of Exercise per Session: Not on file  Stress: Stress Concern Present (10/02/2022)   [employer] of Occupational Health - Occupational Stress Questionnaire    Feeling of Stress : To some extent  Social Connections: Socially Integrated (10/02/2022)   Social Connection and Isolation Panel [NHANES]  Frequency of Communication with Friends and Family: More than three times a week    Frequency of Social Gatherings with Friends and Family: Once a week    Attends Religious Services: More than 4 times per year    Active Member of [employer] or Organizations: Yes    Attends Engineer, structural: More than 4 times per year    Marital Status: Married   Family History  Problem Relation Age of Onset   Arthritis Paternal Grandmother    Cancer Paternal Grandmother        skin   Arthritis Maternal Grandmother    Diabetes Maternal Grandmother    Cancer Maternal Grandmother        skin   Arthritis Father    Other Father        atrial flutter   Emphysema Father        smoker   Diabetes Mother    Memory loss Mother    Diabetes Maternal Aunt    Diabetes Maternal Uncle    Stroke Paternal Aunt     Allergies  Allergen Reactions   Zithromax [Azithromycin] Other (See Comments)    "convulsions or tremors"   Current Outpatient Medications  Medication Sig Dispense Refill   ALPRAZolam (XANAX) 0.5 MG tablet Take 1 tablet (0.5 mg total) by mouth 2 (two) times daily as needed for anxiety. 30 tablet 1   medroxyPROGESTERone Acetate 150 MG/ML SUSY INJECT 1 ML INTO THE MUSCLE EVERY 3 MONTHS. 1 mL 3   naproxen (NAPROSYN) 500 MG tablet Take 1 tablet (500 mg total) by mouth 2 (two) times daily with a meal. 30 tablet 1   Rimegepant Sulfate (NURTEC) 75 MG TBDP Take 1 tablet (75 mg) by mouth every other day. 16 tablet 3   traMADol (ULTRAM) 50 MG tablet Take 1 tablet (50 mg total) by mouth every 12 (twelve) hours as needed for moderate pain or severe pain. 15 tablet 2   No current facility-administered medications for this visit.   No results found.  Review of Systems:   A ROS was performed including pertinent positives and negatives as documented in the HPI.   Musculoskeletal Exam:    There were no vitals taken for this visit.  Left hip incision is well-appearing.  Range of motion is 30 degrees internal/external rotation of the hip.  She is able to elevate to 120 degrees.  Improved abduction strength.  Distal neurosensory exam is intact  Imaging:      I personally reviewed and interpreted the radiographs.   Assessment:   42 year old female who is 4 and half months status post left hip gluteus medius repair.  She is continuing to improve slowly.  We did discuss a couple options to help her work at home on strengthening and conditioning such as an exercise bike.  She will consider this.  I will plan to see her back in 3 months for possible final check  Plan :    -Return to clinic in 12 weeks for reassessment      I personally saw and evaluated the patient, and participated in the management and treatment plan.  Huel Cote, MD Attending Physician, Orthopedic Surgery  This  document was dictated using Dragon voice recognition software. A reasonable attempt at proof reading has been made to minimize errors.

## 2022-10-12 ENCOUNTER — Other Ambulatory Visit (HOSPITAL_BASED_OUTPATIENT_CLINIC_OR_DEPARTMENT_OTHER): Payer: Self-pay

## 2022-10-27 ENCOUNTER — Other Ambulatory Visit (HOSPITAL_BASED_OUTPATIENT_CLINIC_OR_DEPARTMENT_OTHER): Payer: Self-pay

## 2022-10-30 ENCOUNTER — Other Ambulatory Visit (HOSPITAL_BASED_OUTPATIENT_CLINIC_OR_DEPARTMENT_OTHER): Payer: Self-pay

## 2022-11-10 ENCOUNTER — Other Ambulatory Visit (HOSPITAL_BASED_OUTPATIENT_CLINIC_OR_DEPARTMENT_OTHER): Payer: Self-pay

## 2022-11-10 ENCOUNTER — Other Ambulatory Visit: Payer: Self-pay | Admitting: Internal Medicine

## 2022-11-10 ENCOUNTER — Other Ambulatory Visit: Payer: Self-pay

## 2022-11-10 DIAGNOSIS — Z3042 Encounter for surveillance of injectable contraceptive: Secondary | ICD-10-CM

## 2022-11-10 DIAGNOSIS — Z3009 Encounter for other general counseling and advice on contraception: Secondary | ICD-10-CM

## 2022-11-10 MED ORDER — MEDROXYPROGESTERONE ACETATE 150 MG/ML IM SUSY
PREFILLED_SYRINGE | INTRAMUSCULAR | 3 refills | Status: AC
Start: 2022-11-10 — End: 2023-11-10
  Filled 2022-11-10 – 2022-11-24 (×3): qty 1, 90d supply, fill #0
  Filled 2023-07-17: qty 1, 90d supply, fill #1

## 2022-11-24 ENCOUNTER — Other Ambulatory Visit (HOSPITAL_BASED_OUTPATIENT_CLINIC_OR_DEPARTMENT_OTHER): Payer: Self-pay

## 2022-12-09 ENCOUNTER — Other Ambulatory Visit (HOSPITAL_BASED_OUTPATIENT_CLINIC_OR_DEPARTMENT_OTHER): Payer: Self-pay

## 2022-12-28 ENCOUNTER — Encounter: Payer: 59 | Attending: Psychology | Admitting: Psychology

## 2022-12-28 DIAGNOSIS — F411 Generalized anxiety disorder: Secondary | ICD-10-CM | POA: Insufficient documentation

## 2022-12-28 DIAGNOSIS — Z8659 Personal history of other mental and behavioral disorders: Secondary | ICD-10-CM | POA: Diagnosis not present

## 2022-12-28 DIAGNOSIS — R41840 Attention and concentration deficit: Secondary | ICD-10-CM | POA: Diagnosis not present

## 2022-12-28 NOTE — Progress Notes (Signed)
Neuropsychological Evaluation   Patient:  Sarah Gallagher   DOB: [birth year]  MR Number: [id number]  Location: [facility] [address] [place], [address] [place] [address] Dept: [phone]  Start: 9 AM End: 10 AM  Provider/Observer:     Hershal Coria PsyD  Chief Complaint:      Chief Complaint  Patient presents with   Anxiety   Panic Attack   Stress   Other    Attention and concentration difficulties    Reason For Service:     Sarah Gallagher is a 42 year old female referred for neuropsychological evaluation as part of her overall psychiatric workup to aid in differential diagnosis around concerns related to attentional difficulties, anxiety disorder, history of panic attacks in the ways to best manage these.  The patient was referred by her treating psychiatrist Jomarie Longs, MD after an initial referral to her by the patient's PCP Trena Platt, MD.  The patient has a past medical history including history of recurrent migraine headaches, chronic hip pain, history of depression/anxiety, attention and focus deficits, Chiari I malformation, Mnire's disease.  The patient also has a history of cervical radiculopathy as well as tinnitus in both ears.  The patient also has a history of tremor, neck pain and upper extremity numbness in the past.  Patient did have neurosurgical review and neurological workup regarding her identified mild Chiari 1 malformation was cerebellar tonsils protruding up to 7 mm through the foramen magnum.  Neurosurgery felt that it was not likely to be explanatory of her symptoms as they were more diverse and that surgical interventions would not alleviate issues and felt the malformation was asymptomatic.     The patient describes her attention and concentration difficulties as related to difficulty keeping on track and that she will start activities or  responsibilities and get derailed quite easily, she reports difficulties finding the words efficiently that she wants to fine and difficulties with sustaining attention.  Patient reports that the symptoms appeared to improve when she was taking a stimulant like medication for weight loss previously.   The patient reports that she was initially seen a primary care physician in [place] who had prescribed a medication for weight loss that involved some stimulant type properties.  Patient noted that she felt much better from a cognitive and attentional standpoint with this medicine and they talked about once this medicine for her weight loss and that they would consider ADHD type medications.  However, she changed providers and this was never initiated.  The patient's current PCP had concerns over what the best strategy was for psychotropic intervention and made a referral to psychiatry for an evaluation and assessment with recommendations.  The patient reports that she had significant psychosocial stressors including marital stressors and pain conditions that persisted for some time and psychosocial stressors likely led to the development of panic attacks.  Patient reports that these full-blown panic attacks stopped around 6 or 7 years ago but she still has anxiety.  The patient had significant pain in her hip for some time and saw multiple doctors and more recently was identified as being caused by a torn tendon in her gluteus maximus and she is scheduled for surgical repair soon.  The patient reports that she had always been a very active and the subsequent significant reduction in physical activity had a significant impact on the patient.   Along with the patient continuing to  struggle with anxiety and depressive type of symptoms over the past several years, with episodes of full-blown panic attacks ending roughly 6 or 7 years ago, the patient has continued to struggle with attention and concentration.  The  patient reports that she did well in elementary and middle school academically as she was always gifted intellectually.  Patient started noticing issues with attention around the 6 grade and would often get distracted become hyperfocused on aspects around the room rather than paying attention to the teacher.  There were changes in her life including developing outside hobbies and interest and developing new friends and she had increasing trouble in the seventh grade.  Her parents separated between the seventh and eighth grade, all of which causing changes and more problems in school.  Patient reports that she really did not start noting significant attentional issues till the sixth or seventh grade.  However, the patient reports that she always remembers being very aware and concerned about what other people are thinking of her and how they perceived her.  She [place] in [place] very [place] town where everyone essentially "knew each other."  The patient reports that this feeling of others being aware of what is going on in her life persist to this day and the acute psychosocial stressors she experienced 7 or 8 years ago really exacerbated the symptoms.   The patient has been tried on a number of different psychotropic medications including Lexapro, Prozac, and BuSpar but the patient felt that all of these medications either made her symptoms worse or did not help and she did not stay on them.  The patient reports that she continues to feel overwhelmed by stressors in her life but has not been having full-blown panic attacks.  The patient was prescribed a low-dose Xanax in the past and she has used it once or twice a year during panic events.   The patient has had a lot of psychosocial stressors particularly around marital concerns.  There were significant relationship struggles due to infidelity issues with her spouse in the past and the connections between these infidelity issues and her children's school and people  that worked at the school.  There continue to be ongoing stressors within the marriage but they have improved as far as relationship issues to some degree more recently.  The patient did engage in some counseling through [facility] back in 2017.  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  During particular difficult times in the marriage, the patient's husband is described as making direct statements around possible suicidal ideation to both the patient as well as their children that have caused a great deal of distress.  The patient does not currently fear for her safety, although it is not clear whether these statements and written documents from her husband at the time were of a manipulative/cry for help type of nature or truly intentions of self-harm.  This, however, was very stressful.   Currently, the patient is on limited psychotropic medications.  The patient does have a prescription for alprazolam, which she has very limited use during significant anxiety events but takes it very infrequently.  The patient also  takes medications for pain including oxycodone and has taken tramadol in the past but hopefully the orthopedic interventions for her hip pain hopefully will be helpful in that regard.   The patient deals with chronic migraine but has initiated Nurtec more recently, which has produced significant improvements in the frequency, intensity and duration of headaches.  She was taking other medications for her migraines, which were somewhat helpful but not to the degree the Nurtec has been.  The patient has no history of significant concussive events, working  around toxic materials and/or solvents.   The patient reports some difficulties with sleep.  The patient reports that she will go to bed around 9 PM and then wake up once during the night roughly between 2 and 3 AM because the "dogs will wake her up" and she will let the dogs out and go to the bathroom before returning to bed.  The patient reports that she will then wake up between 5 and 6 AM and be up for the rest of the day but reports that she does not feel rested.  The patient denies being told that she snores at night but I did address the potential benefit for at least ruling out possibility of obstructive sleep apnea, although this is a low probable finding.  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   The patient does have other significant psychosocial stressors beyond marital issues, including the changing health status of her parents, significant conflict with one of her brothers and her ongoing significant pain due to her hip injury which hopefully will be addressed soon through orthopedic interventions.  Tests Administered: Comprehensive Attention Battery (CAB) Continuous Performance Test (CPT)  Participation Level:   Active  Participation Quality:  Appropriate      Behavioral Observation:  The patient appeared well-groomed and appropriately dressed. Her manners were polite and appropriate to the situation. The patient's attitude towards testing was positive and her effort was good. The patient experienced some frustration and difficulty understanding instructions during the CAB and became tearful during the CPT.   Well Groomed, Alert, and Appropriate.   Tests Administered: Comprehensive Attention Battery (CAB) Continuous Performance Test (CPT)  Summary of Results:   In order to objectively assess a wide range of attention concentration and executive  functioning variables and domains the patient was administered the comprehensive attention battery and the CAB CPT measures.  The patient approached this assessment in a straightforward manner maintaining a positive and good effort throughout the measure until the very end during the comprehensive performance testing.  Behavioral observations do show that there was some Epics of frustration and difficulty understanding instructions particularly during the shift discriminant reaction time measure and the patient became tearful during the CPT measure.  There were findings of difficulties during the shift discriminant reaction time measure and these results will be interpreted cautiously.  However, the patient did quite well on all but 1 epic of time during the CPT measure with no significant increase in errors of omission and only 1 brief epic of slowed information processing speed but she did well overall and therefore it appears the results are interpretable.  Initially, the patient was administered the pure auditory and visual reaction time measures.  The patient did quite well from a accuracy standpoint for both measures and correctly identified 50 of 50 targets with 0  errors of omission on both measures.  Her average response x 4 pure visual reaction time was 449 ms which is within normal limits and was 531 ms for the pure auditory reaction time which was also within normal limits.  These measures also serve as a validity checks of effort and the patient performed well with no indications of attempts to exaggerate any difficulties.  This does indicate valid administration.  The patient was then administered the discriminate reaction time measures.  On the visual discriminate reaction time measure the patient correctly responded to 35 of 35 targets with no errors of omission and no errors of commission.  The patient required 549 ms as an average response time with both accuracy and response time measures  within normal limits for age and gender comparisons.  On the auditory discriminate reaction time measure she correctly responded to 34 of 35 targets with no errors of commission and 1 error of omission.  This is within normal limits.  Her average response time was 801 ms which is also within normal limits.  On the shift discriminant reaction time measure the patient correctly responded to 28 of 30 targets but had 15 errors of commission and 2 errors of omission.  Given behavioral observations I would suspect that the patient had difficulty understanding the elements of shifting between visual related targets versus auditory targets and close inspection of raw data would confirm this where she would respond to both auditory and visual aspects of the target rather than shifting between 1 versus the other.  Her average response time for correct items was within normal limits.  On the auditory/visual scan reaction time measures the patient performed quite well from an accuracy standpoint.  On the visual, auditory and mixed scan reaction time measure she had a perfect score for accuracy which was well within normal limits.  Her average response times for visual scan reaction time measure was 696 ms, auditory scan reaction time measure 1352 ms and mixed scan reaction time measure 1138 ms.  All of these are within normal limits.  Next, the patient was administered the auditory/visual encoding test.  This is a similar measure to the classic digit span's test from the Wechsler intelligence scales/Wechsler Memory Scales.  The patient did quite well on these measures for both auditory forwards and backwards and visual forwards and backwards measures.  She performed at or above normative expectations and consistently 1 standard deviation or more better than normative expectations.  This suggest that she has excellent auditory and visual encoding capacities.  The patient was then administered the Stroop interference  cancellation test.  This measure starts out with 4 9 interference trials that are basically focus execute task requiring visual scanning, visual searching and speed of mental operations.  While the patient is given instructions that an auditory distractor will be introduced halfway through the measure they do not get a chance to practice performing this focus execute task with the auditory distractor.  On the first 4 9 interference trials the patient did well performing equal to or better than normative expectations.  When the auditory distractor was initially introduced the patient had some challenges adjusting to this distractor but quickly rebounded and by the second interference trial she was correctly identifying between 10 and 12 of the 18 targets within the 15 seconds allotted.  There was considerable variability between various trials both 9 interference and interference trials and displayed moderate difficulties with adapting to and avoiding excessive external distractors.  Finally, the  patient was administered the visual monitor CPT measure from the comprehensive attention battery.  This is a visual discriminate reaction time measure that is 15 minutes long.  It is broken down into five 3-minute blocks of time for analysis.  The patient did very well across the entire 15 minutes as far as accuracy scores.  The patient correctly got 30 of 30 targets during the first 9 minutes of the task and had only 2 errors of omission during the final 6 minutes of this task.  Accuracy scores were well within normal limits.  On the first 3 minutes of this task the patient's average response time was 582 ms.  She remained quite consistent for the first 9 minutes of this measure as far as response time measures.  During the 9-12-minute mark her average response time increased to 638 ms which is a mild increase in response time but this was also consistent with her first air of omission and the patient being observed  becoming teary-eyed and upset about even 1 error.  On the last 3 minutes of the task she returned back to her average response time with an average of 575 ms which is essentially identical to the first 9-minute epic of this measure.  There did not appear to be any deficits with regard to her capacity to sustain attention.  It is important to note that even though she had extremely limited numbers of errors of omission she reacted quite strongly emotionally to only 1 error or missed item.  I suspect that this played a role in her emotional response but even with this emotional response there was no significant change in performance and she never had any epic of time where she was outside of normative expectations.  Impression/Diagnosis:   Overall, the results of the current neuropsychological evaluation with in-depth analysis of multiple factors of attention and concentration did not identify any objective findings of deficits within this cognitive domain.  The patient did very well on measures of information processing speed and focus execute abilities, good performance for both auditory and visual encoding capacity, displayed efficient and effective performance on sustained attentional abilities and for the most part good shifting attention.  There were 2 measures where the patient had difficulties.  1 was the shift discriminant reaction time measure where she had difficulty understanding the required elements of this measure that were not particularly consistent with an inability to shift attention but likely represented an overthinking of the instructions and perceiving them is more complicated than they actually were.  Similar to the single 3-minute epic of time during the continual performance measure the patient was quite aware of any errors that she made and when she did have limited errors that were even within normal limits compared to normative expectation she responded emotionally.  This type of pattern  was seen both in the Stroop interference cancellation test as well as a brief period of time during the visual monitor CPT measure.  Overall, the patient's actual attentional capacities when objectively assessed all appeared to be within normal limits and the only difficulty she had during this measure were likely reflexive of significant self-monitoring and concern for any errors that were made even on measures were some errors were inevitable.  This pattern is likely consistent with an related to difficulties she has had since the sixth or seventh grade when she was around age 23.  The patient has a history of considerable psychosocial stressors that developed around this time and then  significant psychosocial stressors associated with her marriage and adulthood.  The patient has a history of panic attacks, chronic pain difficulties and unusual neurological symptoms.  These have included tremors, concerned about possible symptomatic Chiari malformation that was assessed by neurology and neurosurgery and felt to be asymptomatic and/or not a surgical candidate.  As far as diagnostic considerations, the patient's clinical history does not really suggest a history of attention deficit disorder as symptoms really did not present themselves until around age 26.  While this is right at the cutoff for diagnostic considerations there were also significant psychosocial stressors around that time with her parents ultimately divorcing.  The patient acknowledges a history of being hypervigilant about what other people are perceived to be thinking about her which likely plays a major role in her attentional difficulties in life.  I suspect an internal focus and hypervigilance and anxiety around others perception of her are a primary culprit and play a role with an underlying anxiety disorder.  While the patient has taken stimulant type medications previously for weight loss goals and felt that it improved her attention and  concentration I do not think that this is a particularly good avenue for long-term care.  I would be quite concerned that the primary culprits of her attentional difficulties have to do with anxiety and hypervigilance would eventually be exacerbated and potentially trigger a return of full-blown panic events.  While the patient has not responded well to some psychotropic medications in the past including BuSpar, Prozac and Lexapro.  It may be worthwhile to do genetic studies around metabolism of SSRI type medications to see if there may be some issue with metabolization of these medications and point towards once that may be more tolerable.  The patient's history of full-blown panic attacks, significant hypervigilance around what others are thinking and generalized anxiety are likely the primary factor.  The patient does not appear to have symptoms consistent with obstructive sleep apnea but does have a history of chronic pain which is also likely exacerbating her overall symptoms.  Continued monitoring of possible symptomatic effects of her Chiari I malformation should be maintained although it is very mild and unlikely to be symptomatic.  I will sit down with the patient go over the results of the current neuropsychological evaluation and make sure this evaluation is made available to her referring psychiatrist and I will also be available in her EMR for her PCP to review.  Diagnosis:    Generalized anxiety disorder  History of panic attacks  Attention and concentration deficit   _____________________ Arley Phenix, Psy.D. Clinical Neuropsychologist

## 2023-01-04 ENCOUNTER — Encounter: Payer: 59 | Attending: Psychology | Admitting: Psychology

## 2023-01-04 DIAGNOSIS — R41840 Attention and concentration deficit: Secondary | ICD-10-CM | POA: Insufficient documentation

## 2023-01-04 DIAGNOSIS — Z8659 Personal history of other mental and behavioral disorders: Secondary | ICD-10-CM | POA: Insufficient documentation

## 2023-01-04 DIAGNOSIS — F411 Generalized anxiety disorder: Secondary | ICD-10-CM | POA: Diagnosis not present

## 2023-01-08 ENCOUNTER — Ambulatory Visit (HOSPITAL_BASED_OUTPATIENT_CLINIC_OR_DEPARTMENT_OTHER): Payer: 59 | Admitting: Orthopaedic Surgery

## 2023-01-10 ENCOUNTER — Other Ambulatory Visit (HOSPITAL_BASED_OUTPATIENT_CLINIC_OR_DEPARTMENT_OTHER): Payer: Self-pay

## 2023-03-02 ENCOUNTER — Encounter: Payer: Self-pay | Admitting: Psychology

## 2023-03-02 NOTE — Progress Notes (Signed)
Neuropsychological Evaluation   Patient:  Sarah Gallagher   DOB: 1980/05/22  MR Number: [id number]  Location: [facility] A DEPT OF [facility] [address] [place], [place] [address] [place] [address] Dept: [phone]  Start: 9 AM End: 10 AM  Provider/Observer:     Hershal Coria PsyD  Chief Complaint:      Chief Complaint  Patient presents with   Anxiety   Panic Attack   Stress   Other   01/04/2023: Today was an in person visit that was conducted in my outpatient clinic office with the patient myself present.  Today I provided feedback to the patient regarding the results of the recent neuropsychological evaluation regarding both diagnostic considerations as well as treatment recommendations going forward.  I have included the reason for service and the summary of the evaluation below for convenience and the patient's full background clinical history can be found in the patient's EMR dated 05/01/2022 and the formal report with testing results and test data found on date 12/28/2022.  Reason For Service:     Sarah Gallagher is a 42 year old female referred for neuropsychological evaluation as part of her overall psychiatric workup to aid in differential diagnosis around concerns related to attentional difficulties, anxiety disorder, history of panic attacks in the ways to best manage these.  The patient was referred by her treating psychiatrist Jomarie Longs, MD after an initial referral to her by the patient's PCP Trena Platt, MD.  The patient has a past medical history including history of recurrent migraine headaches, chronic hip pain, history of depression/anxiety, attention and focus deficits, Chiari I malformation, Mnire's disease.  The patient also has a history of cervical radiculopathy as well as tinnitus in both ears.  The patient also has a history of tremor, neck pain and upper  extremity numbness in the past.  Patient did have neurosurgical review and neurological workup regarding her identified mild Chiari 1 malformation was cerebellar tonsils protruding up to 7 mm through the foramen magnum.  Neurosurgery felt that it was not likely to be explanatory of her symptoms as they were more diverse and that surgical interventions would not alleviate issues and felt the malformation was asymptomatic.     The patient describes her attention and concentration difficulties as related to difficulty keeping on track and that she will start activities or responsibilities and get derailed quite easily, she reports difficulties finding the words efficiently that she wants to fine and difficulties with sustaining attention.  Patient reports that the symptoms appeared to improve when she was taking a stimulant like medication for weight loss previously.   The patient reports that she was initially seen a primary care physician in [place] who had prescribed a medication for weight loss that involved some stimulant type properties.  Patient noted that she felt much better from a cognitive and attentional standpoint with this medicine and they talked about once this medicine for her weight loss and that they would consider ADHD type medications.  However, she changed providers and this was never initiated.  The patient's current PCP had concerns over what the best strategy was for psychotropic intervention and made a referral to psychiatry for an evaluation and assessment with recommendations.  The patient reports that she had significant psychosocial stressors including marital stressors and pain conditions that persisted for some time and psychosocial stressors likely led to the development of panic  attacks.  Patient reports that these full-blown panic attacks stopped around 6 or 7 years ago but she still has anxiety.  The patient had significant pain in her hip for some time and saw multiple  doctors and more recently was identified as being caused by a torn tendon in her gluteus maximus and she is scheduled for surgical repair soon.  The patient reports that she had always been a very active and the subsequent significant reduction in physical activity had a significant impact on the patient.   Along with the patient continuing to struggle with anxiety and depressive type of symptoms over the past several years, with episodes of full-blown panic attacks ending roughly 6 or 7 years ago, the patient has continued to struggle with attention and concentration.  The patient reports that she did well in elementary and middle school academically as she was always gifted intellectually.  Patient started noticing issues with attention around the 6 grade and would often get distracted become hyperfocused on aspects around the room rather than paying attention to the teacher.  There were changes in her life including developing outside hobbies and interest and developing new friends and she had increasing trouble in the seventh grade.  Her parents separated between the seventh and eighth grade, all of which causing changes and more problems in school.  Patient reports that she really did not start noting significant attentional issues till the sixth or seventh grade.  However, the patient reports that she always remembers being very aware and concerned about what other people are thinking of her and how they perceived her.  She [place] in [place] town where everyone essentially "knew each other."  The patient reports that this feeling of others being aware of what is going on in her life persist to this day and the acute psychosocial stressors she experienced 7 or 8 years ago really exacerbated the symptoms.   The patient has been tried on a number of different psychotropic medications including Lexapro, Prozac, and BuSpar but the patient felt that all of these medications either made her symptoms worse  or did not help and she did not stay on them.  The patient reports that she continues to feel overwhelmed by stressors in her life but has not been having full-blown panic attacks.  The patient was prescribed a low-dose Xanax in the past and she has used it once or twice a year during panic events.   The patient has had a lot of psychosocial stressors particularly around marital concerns.  There were significant relationship struggles due to infidelity issues with her spouse in the past and the connections between these infidelity issues and her children's school and people that worked at the school.  There continue to be ongoing stressors within the marriage but they have improved as far as relationship issues to some degree more recently.  The patient did engage in some counseling through [facility] back in 2017.  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  During particular difficult times in the marriage, the patient's husband is described as making direct statements around possible suicidal ideation to both the patient as well  as their children that have caused a great deal of distress.  The patient does not currently fear for her safety, although it is not clear whether these statements and written documents from her husband at the time were of a manipulative/cry for help type of nature or truly intentions of self-harm.  This, however, was very stressful.   Currently, the patient is on limited psychotropic medications.  The patient does have a prescription for alprazolam, which she has very limited use during significant anxiety events but takes it very  infrequently.  The patient also takes medications for pain including oxycodone and has taken tramadol in the past but hopefully the orthopedic interventions for her hip pain hopefully will be helpful in that regard.   The patient deals with chronic migraine but has initiated Nurtec more recently, which has produced significant improvements in the frequency, intensity and duration of headaches.  She was taking other medications for her migraines, which were somewhat helpful but not to the degree the Nurtec has been.  The patient has no history of significant concussive events, working around toxic materials and/or solvents.   The patient reports some difficulties with sleep.  The patient reports that she will go to bed around 9 PM and then wake up once during the night roughly between 2 and 3 AM because the "dogs will wake her up" and she will let the dogs out and go to the bathroom before returning to bed.  The patient reports that she will then wake up between 5 and 6 AM and be up for the rest of the day but reports that she does not feel rested.  The patient denies being told that she snores at night but I did address the potential benefit for at least ruling out possibility of obstructive sleep apnea, although this is a low probable finding.  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   The patient does have other significant psychosocial stressors beyond marital issues, including the changing health status of her parents, significant conflict with one of her brothers and her ongoing significant pain due to her hip injury which hopefully will be addressed soon through orthopedic interventions.   Impression/Diagnosis:   Overall, the results of the current neuropsychological evaluation with in-depth analysis of multiple factors of attention and concentration did not identify  any objective findings of deficits within this cognitive domain.  The patient did very well on measures of information processing speed and focus execute abilities, good performance for both auditory and visual encoding capacity, displayed efficient and effective performance on sustained attentional abilities and for the most part good shifting attention.  There were 2 measures where the patient had difficulties.  1 was the shift discriminant reaction time measure where she had difficulty understanding the required elements of this measure that were not particularly consistent with an inability to shift attention but likely represented an overthinking of the instructions and perceiving them is more complicated than they actually were.  Similar to the single 3-minute epic of time during the continual performance measure the patient was quite aware of any errors that she made and when she did have limited errors that were even within normal limits compared to normative expectation she responded emotionally.  This type of pattern was seen both in the Stroop interference cancellation test as well as a brief period  of time during the visual monitor CPT measure.  Overall, the patient's actual attentional capacities when objectively assessed all appeared to be within normal limits and the only difficulty she had during this measure were likely reflexive of significant self-monitoring and concern for any errors that were made even on measures were some errors were inevitable.  This pattern is likely consistent with an related to difficulties she has had since the sixth or seventh grade when she was around age 29.  The patient has a history of considerable psychosocial stressors that developed around this time and then significant psychosocial stressors associated with her marriage and adulthood.  The patient has a history of panic attacks, chronic pain difficulties and unusual neurological symptoms.  These have included  tremors, concerned about possible symptomatic Chiari malformation that was assessed by neurology and neurosurgery and felt to be asymptomatic and/or not a surgical candidate.  As far as diagnostic considerations, the patient's clinical history does not really suggest a history of attention deficit disorder as symptoms really did not present themselves until around age 39.  While this is right at the cutoff for diagnostic considerations there were also significant psychosocial stressors around that time with her parents ultimately divorcing.  The patient acknowledges a history of being hypervigilant about what other people are perceived to be thinking about her which likely plays a major role in her attentional difficulties in life.  I suspect an internal focus and hypervigilance and anxiety around others perception of her are a primary culprit and play a role with an underlying anxiety disorder.  While the patient has taken stimulant type medications previously for weight loss goals and felt that it improved her attention and concentration I do not think that this is a particularly good avenue for long-term care.  I would be quite concerned that the primary culprits of her attentional difficulties have to do with anxiety and hypervigilance would eventually be exacerbated and potentially trigger a return of full-blown panic events.  While the patient has not responded well to some psychotropic medications in the past including BuSpar, Prozac and Lexapro.  It may be worthwhile to do genetic studies around metabolism of SSRI type medications to see if there may be some issue with metabolization of these medications and point towards once that may be more tolerable.  The patient's history of full-blown panic attacks, significant hypervigilance around what others are thinking and generalized anxiety are likely the primary factor.  The patient does not appear to have symptoms consistent with obstructive sleep apnea but  does have a history of chronic pain which is also likely exacerbating her overall symptoms.  Continued monitoring of possible symptomatic effects of her Chiari I malformation should be maintained although it is very mild and unlikely to be symptomatic.  I will sit down with the patient go over the results of the current neuropsychological evaluation and make sure this evaluation is made available to her referring psychiatrist and I will also be available in her EMR for her PCP to review.  Diagnosis:    Generalized anxiety disorder  History of panic attacks  Attention and concentration deficit   _____________________ Arley Phenix, Psy.D. Clinical Neuropsychologist

## 2023-03-09 ENCOUNTER — Other Ambulatory Visit (HOSPITAL_BASED_OUTPATIENT_CLINIC_OR_DEPARTMENT_OTHER): Payer: Self-pay

## 2023-03-09 ENCOUNTER — Other Ambulatory Visit: Payer: Self-pay

## 2023-03-09 ENCOUNTER — Other Ambulatory Visit: Payer: Self-pay | Admitting: Internal Medicine

## 2023-03-09 DIAGNOSIS — G43009 Migraine without aura, not intractable, without status migrainosus: Secondary | ICD-10-CM

## 2023-03-09 MED ORDER — NURTEC 75 MG PO TBDP
75.0000 mg | ORAL_TABLET | ORAL | 3 refills | Status: AC
Start: 2023-03-09 — End: ?
  Filled 2023-03-09 – 2023-03-19 (×2): qty 16, 32d supply, fill #0
  Filled 2023-07-17: qty 16, 32d supply, fill #1

## 2023-03-12 ENCOUNTER — Ambulatory Visit: Payer: 59 | Admitting: Internal Medicine

## 2023-03-19 ENCOUNTER — Other Ambulatory Visit (HOSPITAL_BASED_OUTPATIENT_CLINIC_OR_DEPARTMENT_OTHER): Payer: Self-pay

## 2023-06-25 ENCOUNTER — Telehealth: Payer: Self-pay | Admitting: Pharmacy Technician

## 2023-06-25 ENCOUNTER — Other Ambulatory Visit (HOSPITAL_COMMUNITY): Payer: Self-pay

## 2023-06-25 NOTE — Telephone Encounter (Signed)
 Pharmacy Patient Advocate Encounter   Received notification from CoverMyMeds that prior authorization for NURTEC ODT 75MG  TABLETS is required/requested.   Insurance verification completed.   The patient is insured through [facility] .   Per test claim: The current 30 day co-pay is, $0.00.  No PA needed at this time. This test claim was processed through [facility]- copay amounts may vary at other pharmacies due to pharmacy/plan contracts, or as the patient moves through the different stages of their insurance plan.

## 2023-07-12 ENCOUNTER — Other Ambulatory Visit (HOSPITAL_COMMUNITY): Payer: Self-pay | Admitting: Internal Medicine

## 2023-07-12 DIAGNOSIS — Z1231 Encounter for screening mammogram for malignant neoplasm of breast: Secondary | ICD-10-CM

## 2023-07-17 ENCOUNTER — Other Ambulatory Visit (HOSPITAL_COMMUNITY): Payer: Self-pay

## 2023-07-17 ENCOUNTER — Telehealth: Payer: Self-pay

## 2023-07-17 ENCOUNTER — Other Ambulatory Visit (HOSPITAL_BASED_OUTPATIENT_CLINIC_OR_DEPARTMENT_OTHER): Payer: Self-pay

## 2023-07-17 ENCOUNTER — Other Ambulatory Visit: Payer: Self-pay | Admitting: Internal Medicine

## 2023-07-17 DIAGNOSIS — G8929 Other chronic pain: Secondary | ICD-10-CM

## 2023-07-17 MED ORDER — TRAMADOL HCL 50 MG PO TABS
50.0000 mg | ORAL_TABLET | Freq: Two times a day (BID) | ORAL | 1 refills | Status: AC | PRN
Start: 2023-07-17 — End: ?
  Filled 2023-07-17: qty 15, 8d supply, fill #0

## 2023-07-17 NOTE — Telephone Encounter (Signed)
123

## 2023-07-17 NOTE — Telephone Encounter (Signed)
 Pharmacy Patient Advocate Encounter   Received notification from CoverMyMeds that prior authorization for Nurtec 75MG  dispersible tablets is required/requested.   Insurance verification completed.   The patient is insured through [facility] .   Per test claim: PA required; PA submitted to above mentioned insurance via CoverMyMeds Key/confirmation #/EOC [id number] Status is pending

## 2023-07-17 NOTE — Telephone Encounter (Signed)
error 

## 2023-07-18 ENCOUNTER — Other Ambulatory Visit (HOSPITAL_COMMUNITY): Payer: Self-pay

## 2023-07-18 ENCOUNTER — Ambulatory Visit (HOSPITAL_COMMUNITY)
Admission: RE | Admit: 2023-07-18 | Discharge: 2023-07-18 | Disposition: A | Discharge status: Home / Self Care | Source: Ambulatory Visit | Attending: Internal Medicine | Admitting: Internal Medicine

## 2023-07-18 DIAGNOSIS — Z1231 Encounter for screening mammogram for malignant neoplasm of breast: Secondary | ICD-10-CM | POA: Diagnosis not present

## 2023-07-20 ENCOUNTER — Other Ambulatory Visit (HOSPITAL_COMMUNITY): Payer: Self-pay

## 2023-07-20 NOTE — Telephone Encounter (Signed)
 Pharmacy Patient Advocate Encounter  Received notification from [facility] that Prior Authorization for Nurtec 75MG  dispersible tablets has been APPROVED from 07/19/2023 to 07/17/2024. Ran test claim, Copay is $0.00. This test claim was processed through [facility]- copay amounts may vary at other pharmacies due to pharmacy/plan contracts, or as the patient moves through the different stages of their insurance plan.   PA #/Case ID/Reference #: [id number]

## 2023-09-19 ENCOUNTER — Telehealth: Payer: Self-pay | Admitting: Internal Medicine

## 2023-09-19 ENCOUNTER — Other Ambulatory Visit (HOSPITAL_BASED_OUTPATIENT_CLINIC_OR_DEPARTMENT_OTHER): Payer: Self-pay

## 2023-09-19 ENCOUNTER — Encounter: Payer: Self-pay | Admitting: Internal Medicine

## 2023-09-19 VITALS — Ht 65.0 in | Wt 190.0 lb

## 2023-09-19 DIAGNOSIS — R7303 Prediabetes: Secondary | ICD-10-CM

## 2023-09-19 DIAGNOSIS — F419 Anxiety disorder, unspecified: Secondary | ICD-10-CM

## 2023-09-19 DIAGNOSIS — G43009 Migraine without aura, not intractable, without status migrainosus: Secondary | ICD-10-CM | POA: Diagnosis not present

## 2023-09-19 DIAGNOSIS — G935 Compression of brain: Secondary | ICD-10-CM

## 2023-09-19 DIAGNOSIS — F411 Generalized anxiety disorder: Secondary | ICD-10-CM

## 2023-09-19 DIAGNOSIS — E785 Hyperlipidemia, unspecified: Secondary | ICD-10-CM

## 2023-09-19 DIAGNOSIS — E559 Vitamin D deficiency, unspecified: Secondary | ICD-10-CM

## 2023-09-19 MED ORDER — ALPRAZOLAM 0.5 MG PO TABS
0.5000 mg | ORAL_TABLET | Freq: Two times a day (BID) | ORAL | 1 refills | Status: AC | PRN
Start: 2023-09-19 — End: ?
  Filled 2023-09-19: qty 30, 15d supply, fill #0

## 2023-09-19 NOTE — Progress Notes (Signed)
 Virtual Visit via Video Note   Because of Sarah Gallagher's co-morbid illnesses, she is at least at moderate risk for complications without adequate follow up.  This format is felt to be most appropriate for this patient at this time.  All issues noted in this document were discussed and addressed.  A limited physical exam was performed with this format.      Evaluation Performed:  Follow-up visit  Date:  09/19/2023   ID:  Sarah Gallagher, DOB 1980/05/04, MRN [id number]  Patient Location: Home Provider Location: Office/Clinic  Participants: Patient Location of Patient: Home Location of Provider: Telehealth Consent was obtain for visit to be over via telehealth. I verified that I am speaking with the correct person using two identifiers.  PCP:  Meldon Sport, MD   Chief Complaint: Follow up of chronic medical conditions  History of Present Illness:    Sarah Gallagher is a 43 y.o. female with PMH of migraine, chronic hip pain and depression with anxiety who has a video visit for f/u of her chronic medical conditions.   She had left gluteus medius repair surgery.  She has completed PT. She has noticed improvement in her leg pain. Denies any numbness or tingling of the foot.  She still has spells of anxiety and decreased concentration.  She has seen psychiatrist for evaluation of anxiety and possible ADD.  She was referred for neuropsych evaluation, which suggested GAD and hypervigilance into other's perception of her. She does not necessarily agree to the evaluation. Her sons are on Focalin for ADD currently. She has had panic episodes, and has used Xanax  with good relief.  Her migraine episodes have been less frequent since her leg pain has improved now. She has Nurtec PRN for migraine.  The patient does not have symptoms concerning for COVID-19 infection (fever, chills, cough, or new shortness of breath).   Past Medical, Surgical, Social History, Allergies, and Medications  have been Reviewed.  Past Medical History:  Diagnosis Date   Anxiety    Arthritis    Chest discomfort    a. associated with palpitations.   Depression    Dyspnea on exertion    a. 12/2016 Echo: EF 55-60%, no rwma, nl PASP.   Juvenile rheumatoid arthritis (HCC)    Meniere disease    Migraines    Palpitations    PONV (postoperative nausea and vomiting)    PVC's (premature ventricular contractions)    a. 01/2017 48h Holter: Rare PAC/PVC. Freq runs of sinus tachycardia.   Past Surgical History:  Procedure Laterality Date   GLUTEUS MINIMUS REPAIR Left 05/23/2022   Procedure: LEFT GLUTEUS MEDIUS REPAIR;  Surgeon: Wilhelmenia Harada, MD;  Location: [facility];  Service: Orthopedics;  Laterality: Left;   KNEE SURGERY Right 1997, 1997   torn meniscus, ACL replacement     REFRACTIVE SURGERY     lasix     Current Meds  Medication Sig   ALPRAZolam  (XANAX ) 0.5 MG tablet Take 1 tablet (0.5 mg total) by mouth 2 (two) times daily as needed for anxiety.   naproxen  (NAPROSYN ) 500 MG tablet Take 1 tablet (500 mg total) by mouth 2 (two) times daily with a meal.   Rimegepant Sulfate  (NURTEC) 75 MG TBDP Take 1 tablet (75 mg) by mouth every other day.   traMADol  (ULTRAM ) 50 MG tablet Take 1 tablet (50 mg total) by mouth every 12 (twelve) hours as needed for moderate pain or severe pain.  Allergies:   Zithromax  [azithromycin ]   ROS:   Please see the history of present illness. All other systems reviewed and are negative.   Labs/Other Tests and Data Reviewed:    Recent Labs: No results found for requested labs within last 365 days.   Recent Lipid Panel Lab Results  Component Value Date/Time   CHOL 125 11/23/2021 08:25 AM   TRIG 42 11/23/2021 08:25 AM   HDL 38 (L) 11/23/2021 08:25 AM   CHOLHDL 3.3 11/23/2021 08:25 AM   CHOLHDL 3.6 10/18/2018 08:22 AM   LDLCALC 77 11/23/2021 08:25 AM    Wt Readings from Last 3 Encounters:  09/19/23 190 lb (86.2 kg)  10/02/22 190 lb  (86.2 kg)  08/01/22 189 lb 9.6 oz (86 kg)     Objective:    Vital Signs:  Ht 5' 5 (1.651 m)   Wt 190 lb (86.2 kg)   BMI 31.62 kg/m    VITAL SIGNS:  reviewed GEN:  no acute distress EYES:  sclerae anicteric, EOMI - Extraocular Movements Intact RESPIRATORY:  normal respiratory effort, symmetric expansion NEURO:  alert and oriented x 3, no obvious focal deficit PSYCH:  normal affect  ASSESSMENT & PLAN:    Anxiety disorder She had concern for ADHD Referred to psychiatry - had neuropsych evaluation, was not suggstive of ADHD She has not tolerated multiple antidepressants in the past, including Lexapro, Effexor , BuSpar  etc. She prefers to continue relaxation techniques at home for now Xanax  as needed for panic episode, rarely takes it  Migraine Well controlled with Nurtec as needed  Chiari I malformation (HCC) Has been evaluated by Neurosurgery, no indication for surgery Symptoms not related to Chiari malformation   I discussed the assessment and treatment plan with the patient. The patient was provided an opportunity to ask questions, and all were answered. The patient agreed with the plan and demonstrated an understanding of the instructions.   The patient was advised to call back or seek an in-person evaluation if the symptoms worsen or if the condition fails to improve as anticipated.  The above assessment and management plan was discussed with the patient. The patient verbalized understanding of and has agreed to the management plan.   Medication Adjustments/Labs and Tests Ordered: Current medicines are reviewed at length with the patient today.  Concerns regarding medicines are outlined above.   Tests Ordered: No orders of the defined types were placed in this encounter.   Medication Changes: No orders of the defined types were placed in this encounter.    Note: This dictation was prepared with Dragon dictation along with smaller phrase technology. Similar  sounding words can be transcribed inadequately or may not be corrected upon review. Any transcriptional errors that result from this process are unintentional.      Disposition:  Follow up  Signed, Meldon Sport, MD  09/19/2023 8:48 AM     [name]

## 2023-09-19 NOTE — Assessment & Plan Note (Addendum)
 She had concern for ADHD Referred to psychiatry - had neuropsych evaluation, was not suggstive of ADHD She has not tolerated multiple antidepressants in the past, including Lexapro, Effexor , BuSpar  etc. She prefers to continue relaxation techniques at home for now Xanax  as needed for panic episode, rarely takes it

## 2023-09-19 NOTE — Patient Instructions (Signed)
 Please continue to take medications as prescribed.  Please continue to follow low carb diet and perform moderate exercise/walking at least 150 mins/week.

## 2023-09-19 NOTE — Assessment & Plan Note (Signed)
Well controlled with Nurtec as needed °

## 2023-09-19 NOTE — Assessment & Plan Note (Signed)
 Has been evaluated by Neurosurgery, no indication for surgery Symptoms not related to Chiari malformation

## 2023-10-01 ENCOUNTER — Other Ambulatory Visit (HOSPITAL_BASED_OUTPATIENT_CLINIC_OR_DEPARTMENT_OTHER): Payer: Self-pay

## 2023-11-07 ENCOUNTER — Telehealth: Payer: Self-pay

## 2023-11-07 NOTE — Telephone Encounter (Signed)
 Called patient left a voice mail to reach office to schedule a physical

## 2023-11-07 NOTE — Telephone Encounter (Signed)
 Called pt and made her aware she needs to come into office for a CPE

## 2023-11-07 NOTE — Telephone Encounter (Signed)
 Copied from CRM [id number]. Topic: Medical Record Request - Records Request >> Nov 07, 2023  1:01 PM Ivette P wrote: Reason for CRM: PT calling in about having a [employer] insurance visit. For being a [employer] employee pt would like to know if she had her [place] visit to qualify for a discount.    Would like for someone to reach out. [phone] (M)

## 2023-11-07 NOTE — Telephone Encounter (Signed)
 Patient is on waitlist- will receive a call or MyChart message if something becomes available earlier.

## 2023-11-07 NOTE — Telephone Encounter (Signed)
 Copied from CRM #[id number]. Topic: Appointments - Scheduling Inquiry for Clinic >> Nov 07, 2023  2:58 PM Sarah Gallagher wrote: Reason for CRM: The patient needs a physical before the end of August to keep her [facility] [employer] employee insurance and is requesting to see if she could be scheduled sooner. I scheduled her for the soonest available and added her to the wait list. Her call back number is [phone].

## 2023-11-08 DIAGNOSIS — E785 Hyperlipidemia, unspecified: Secondary | ICD-10-CM | POA: Diagnosis not present

## 2023-11-08 DIAGNOSIS — F411 Generalized anxiety disorder: Secondary | ICD-10-CM | POA: Diagnosis not present

## 2023-11-08 DIAGNOSIS — G43009 Migraine without aura, not intractable, without status migrainosus: Secondary | ICD-10-CM | POA: Diagnosis not present

## 2023-11-08 DIAGNOSIS — E559 Vitamin D deficiency, unspecified: Secondary | ICD-10-CM | POA: Diagnosis not present

## 2023-11-08 DIAGNOSIS — R7303 Prediabetes: Secondary | ICD-10-CM | POA: Diagnosis not present

## 2023-11-09 LAB — CMP14+EGFR
ALT: 11 IU/L (ref 0–32)
AST: 17 IU/L (ref 0–40)
Albumin: 4.5 g/dL (ref 3.9–4.9)
Alkaline Phosphatase: 88 IU/L (ref 44–121)
BUN/Creatinine Ratio: 14 (ref 9–23)
BUN: 13 mg/dL (ref 6–24)
Bilirubin Total: 0.4 mg/dL (ref 0.0–1.2)
CO2: 21 mmol/L (ref 20–29)
Calcium: 9.5 mg/dL (ref 8.7–10.2)
Chloride: 103 mmol/L (ref 96–106)
Creatinine, Ser: 0.96 mg/dL (ref 0.57–1.00)
Globulin, Total: 2.6 g/dL (ref 1.5–4.5)
Glucose: 87 mg/dL (ref 70–99)
Potassium: 4.5 mmol/L (ref 3.5–5.2)
Sodium: 139 mmol/L (ref 134–144)
Total Protein: 7.1 g/dL (ref 6.0–8.5)
eGFR: 75 mL/min/1.73 (ref >59)

## 2023-11-09 LAB — CBC WITH DIFFERENTIAL/PLATELET
Basophils Absolute: 0.1 x10E3/uL (ref 0.0–0.2)
Basos: 1 %
EOS (ABSOLUTE): 0.1 x10E3/uL (ref 0.0–0.4)
Eos: 1 %
Hematocrit: 39.9 % (ref 34.0–46.6)
Hemoglobin: 13.2 g/dL (ref 11.1–15.9)
Immature Grans (Abs): 0 x10E3/uL (ref 0.0–0.1)
Immature Granulocytes: 0 %
Lymphocytes Absolute: 2.2 x10E3/uL (ref 0.7–3.1)
Lymphs: 33 %
MCH: 30.3 pg (ref 26.6–33.0)
MCHC: 33.1 g/dL (ref 31.5–35.7)
MCV: 92 fL (ref 79–97)
Monocytes Absolute: 0.6 x10E3/uL (ref 0.1–0.9)
Monocytes: 9 %
Neutrophils Absolute: 3.9 x10E3/uL (ref 1.4–7.0)
Neutrophils: 56 %
Platelets: 289 x10E3/uL (ref 150–450)
RBC: 4.35 x10E6/uL (ref 3.77–5.28)
RDW: 12.9 % (ref 11.7–15.4)
WBC: 6.9 x10E3/uL (ref 3.4–10.8)

## 2023-11-09 LAB — TSH: TSH: 1.85 u[IU]/mL (ref 0.450–4.500)

## 2023-11-09 LAB — HEMOGLOBIN A1C
Est. average glucose Bld gHb Est-mCnc: 117 mg/dL
Hgb A1c MFr Bld: 5.7 % — ABNORMAL HIGH (ref 4.8–5.6)

## 2023-11-09 LAB — LIPID PANEL
Chol/HDL Ratio: 4.4 ratio (ref 0.0–4.4)
Cholesterol, Total: 166 mg/dL (ref 100–199)
HDL: 38 mg/dL — ABNORMAL LOW (ref >39)
LDL Chol Calc (NIH): 115 mg/dL — ABNORMAL HIGH (ref 0–99)
Triglycerides: 65 mg/dL (ref 0–149)
VLDL Cholesterol Cal: 13 mg/dL (ref 5–40)

## 2023-11-09 LAB — VITAMIN D 25 HYDROXY (VIT D DEFICIENCY, FRACTURES): Vit D, 25-Hydroxy: 30.2 ng/mL (ref 30.0–100.0)

## 2023-11-21 ENCOUNTER — Ambulatory Visit (INDEPENDENT_AMBULATORY_CARE_PROVIDER_SITE_OTHER): Payer: Self-pay | Admitting: Internal Medicine

## 2023-11-21 ENCOUNTER — Encounter: Payer: Self-pay | Admitting: Internal Medicine

## 2023-11-21 VITALS — BP 116/77 | HR 69 | Ht 66.0 in | Wt 188.4 lb

## 2023-11-21 DIAGNOSIS — E66811 Obesity, class 1: Secondary | ICD-10-CM | POA: Insufficient documentation

## 2023-11-21 DIAGNOSIS — F419 Anxiety disorder, unspecified: Secondary | ICD-10-CM | POA: Diagnosis not present

## 2023-11-21 DIAGNOSIS — K219 Gastro-esophageal reflux disease without esophagitis: Secondary | ICD-10-CM | POA: Insufficient documentation

## 2023-11-21 DIAGNOSIS — Z0001 Encounter for general adult medical examination with abnormal findings: Secondary | ICD-10-CM | POA: Diagnosis not present

## 2023-11-21 DIAGNOSIS — Z6830 Body mass index (BMI) 30.0-30.9, adult: Secondary | ICD-10-CM | POA: Diagnosis not present

## 2023-11-21 DIAGNOSIS — G43009 Migraine without aura, not intractable, without status migrainosus: Secondary | ICD-10-CM | POA: Diagnosis not present

## 2023-11-21 MED ORDER — TIRZEPATIDE-WEIGHT MANAGEMENT 2.5 MG/0.5ML ~~LOC~~ SOLN: 2.5000 mg | SUBCUTANEOUS | 0 refills | Status: AC

## 2023-11-21 NOTE — Assessment & Plan Note (Signed)
Well controlled with Nurtec as needed °

## 2023-11-21 NOTE — Progress Notes (Signed)
 Established Patient Office Visit  Subjective:  Patient ID: Sarah Gallagher, female    DOB: 09-Aug-1980  Age: 43 y.o. MRN: [id number]  CC:  Chief Complaint  Patient presents with   Annual Exam    Cpe     HPI Sarah Gallagher is a 43 y.o. female with past medical history of migraine, chronic hip pain and depression with anxiety who presents for f/u of her chronic medical conditions.  She still has spells of anxiety, but has felt better recently. She has seen psychiatrist for evaluation of anxiety and possible ADD.  She was referred for neuropsych evaluation, but was told of GAD. Her sons are on Focalin for ADD currently. She has had panic episodes, and has used Xanax  in the past with good relief.  Her migraine episodes have been less frequent since her leg pain has improved now (?). She has Nurtec PRN for migraine.  Obesity: She was struggling to lose weight despite following low-carb diet.  She recently started taking semaglutide via online platform consultation (Ro).  She has noticed about 8 lbs weight loss in the last 3 weeks.  She reports the service being very expensive.   Past Medical History:  Diagnosis Date   Anxiety    Arthritis    Chest discomfort    a. associated with palpitations.   Depression    Dyspnea on exertion    a. 12/2016 Echo: EF 55-60%, no rwma, nl PASP.   Juvenile rheumatoid arthritis (HCC)    Meniere disease    Migraines    Palpitations    PONV (postoperative nausea and vomiting)    PVC's (premature ventricular contractions)    a. 01/2017 48h Holter: Rare PAC/PVC. Freq runs of sinus tachycardia.    Past Surgical History:  Procedure Laterality Date   GLUTEUS MINIMUS REPAIR Left 05/23/2022   Procedure: LEFT GLUTEUS MEDIUS REPAIR;  Surgeon: Genelle Standing, MD;  Location: [facility];  Service: Orthopedics;  Laterality: Left;   KNEE SURGERY Right 1997, 1997   torn meniscus, ACL replacement     REFRACTIVE SURGERY     lasix    Family  History  Problem Relation Age of Onset   Arthritis Paternal Grandmother    Cancer Paternal Grandmother        skin   Arthritis Maternal Grandmother    Diabetes Maternal Grandmother    Cancer Maternal Grandmother        skin   Arthritis Father    Other Father        atrial flutter   Emphysema Father        smoker   Diabetes Mother    Memory loss Mother    Diabetes Maternal Aunt    Diabetes Maternal Uncle    Stroke Paternal Aunt     Social History   Socioeconomic History   Marital status: Married    Spouse name: ray   Number of children: 3   Years of education: BS   Highest education level: Bachelor's degree (e.g., BA, AB, BS)  Occupational History    Comment: [employer], Resp Therapist  Tobacco Use   Smoking status: Never   Smokeless tobacco: Never  Vaping Use   Vaping status: Never Used  Substance and Sexual Activity   Alcohol use: Not Currently    Alcohol/week: 0.0 standard drinks of alcohol    Comment: rare   Drug use: No   Sexual activity: Yes    Birth control/protection: Injection  Other  Topics Concern   Not on file  Social History Narrative   Lives in [place] with husband and children.  Not currently exercising.  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   Caffeine 2-3 cups daily   Social Drivers of Health   Financial Resource Strain: Low Risk  (10/02/2022)   Overall Financial Resource Strain (CARDIA)    Difficulty of Paying Living Expenses: Not very hard  Food Insecurity: No Food Insecurity (10/02/2022)   Hunger Vital Sign    Worried About Running Out of Food in the Last Year: Never true    Ran Out of Food in the Last Year: Never true  Transportation Needs: No Transportation Needs (10/02/2022)   PRAPARE - Administrator, Civil Service (Medical): No    Lack of Transportation (Non-Medical): No  Physical Activity: Unknown (10/02/2022)   Exercise Vital Sign    Days of Exercise per Week: 0 days    Minutes of Exercise per Session: Not on file  Stress:  Stress Concern Present (10/02/2022)   [employer] of Occupational Health - Occupational Stress Questionnaire    Feeling of Stress : To some extent  Social Connections: Socially Integrated (10/02/2022)   Social Connection and Isolation Panel    Frequency of Communication with Friends and Family: More than three times a week    Frequency of Social Gatherings with Friends and Family: Once a week    Attends Religious Services: More than 4 times per year    Active Member of [employer] or Organizations: Yes    Attends Engineer, structural: More than 4 times per year    Marital Status: Married  Catering manager Violence: Not on file    Outpatient Medications Prior to Visit  Medication Sig Dispense Refill   ALPRAZolam  (XANAX ) 0.5 MG tablet Take 1 tablet (0.5 mg total) by mouth 2 (two) times daily as needed for anxiety. 30 tablet 1   naproxen  (NAPROSYN ) 500 MG tablet Take 1 tablet (500 mg total) by mouth 2 (two) times daily with a meal. 30 tablet 1   Rimegepant Sulfate  (NURTEC) 75 MG TBDP Take 1 tablet (75 mg) by mouth every other day. 16 tablet 3   traMADol  (ULTRAM ) 50 MG tablet Take 1 tablet (50 mg total) by mouth every 12 (twelve) hours as needed for moderate pain or severe pain. 15 tablet 1   SEMAGLUTIDE, 2 MG/DOSE, Winston Inject into the skin.     No facility-administered medications prior to visit.    Allergies  Allergen Reactions   Zithromax  [Azithromycin ] Other (See Comments)    convulsions or tremors    ROS Review of Systems  Constitutional:  Negative for chills and fever.  HENT:  Negative for congestion, sinus pressure, sinus pain and sore throat.   Eyes:  Negative for pain and discharge.  Respiratory:  Negative for cough and shortness of breath.   Cardiovascular:  Negative for chest pain and palpitations.  Gastrointestinal:  Negative for abdominal pain, diarrhea, nausea and vomiting.  Endocrine: Negative for polydipsia and polyuria.  Genitourinary:  Negative for dysuria  and hematuria.  Musculoskeletal:  Positive for arthralgias (L hip and knee). Negative for neck pain and neck stiffness.  Skin:  Negative for rash.  Neurological:  Positive for headaches. Negative for dizziness and weakness.  Psychiatric/Behavioral:  Negative for agitation and behavioral problems. The patient is nervous/anxious. The patient is not hyperactive.       Objective:    Physical Exam Vitals reviewed.  Constitutional:      General:  She is not in acute distress.    Appearance: She is not diaphoretic.  HENT:     Head: Normocephalic and atraumatic.     Nose: Nose normal. No congestion.     Mouth/Throat:     Mouth: Mucous membranes are moist.     Pharynx: No posterior oropharyngeal erythema.  Eyes:     General: No scleral icterus.    Extraocular Movements: Extraocular movements intact.  Cardiovascular:     Rate and Rhythm: Normal rate and regular rhythm.     Heart sounds: Normal heart sounds. No murmur heard. Pulmonary:     Breath sounds: Normal breath sounds. No wheezing or rales.  Abdominal:     Palpations: Abdomen is soft.     Tenderness: There is no abdominal tenderness.  Musculoskeletal:     Cervical back: Neck supple. No tenderness.     Right lower leg: No edema.     Left lower leg: No edema.  Skin:    General: Skin is warm.     Findings: No rash.  Neurological:     General: No focal deficit present.     Mental Status: She is alert and oriented to person, place, and time.     Sensory: No sensory deficit.     Motor: No weakness.  Psychiatric:        Mood and Affect: Mood normal.        Behavior: Behavior normal.     BP 116/77   Pulse 69   Ht 5' 6 (1.676 m)   Wt 188 lb 6.4 oz (85.5 kg)   SpO2 93%   BMI 30.41 kg/m  Wt Readings from Last 3 Encounters:  11/21/23 188 lb 6.4 oz (85.5 kg)  09/19/23 190 lb (86.2 kg)  10/02/22 190 lb (86.2 kg)    Lab Results  Component Value Date   TSH 1.850 11/08/2023   Lab Results  Component Value Date   WBC 6.9  11/08/2023   HGB 13.2 11/08/2023   HCT 39.9 11/08/2023   MCV 92 11/08/2023   PLT 289 11/08/2023   Lab Results  Component Value Date   NA 139 11/08/2023   K 4.5 11/08/2023   CO2 21 11/08/2023   GLUCOSE 87 11/08/2023   BUN 13 11/08/2023   CREATININE 0.96 11/08/2023   BILITOT 0.4 11/08/2023   ALKPHOS 88 11/08/2023   AST 17 11/08/2023   ALT 11 11/08/2023   PROT 7.1 11/08/2023   ALBUMIN 4.5 11/08/2023   CALCIUM 9.5 11/08/2023   ANIONGAP 11 05/23/2019   EGFR 75 11/08/2023   GFR 62.94 07/12/2017   Lab Results  Component Value Date   CHOL 166 11/08/2023   Lab Results  Component Value Date   HDL 38 (L) 11/08/2023   Lab Results  Component Value Date   LDLCALC 115 (H) 11/08/2023   Lab Results  Component Value Date   TRIG 65 11/08/2023   Lab Results  Component Value Date   CHOLHDL 4.4 11/08/2023   Lab Results  Component Value Date   HGBA1C 5.7 (H) 11/08/2023      Assessment & Plan:   Problem List Items Addressed This Visit       Cardiovascular and Mediastinum   Migraine   Well controlled with Nurtec as needed      Relevant Orders   CMP14+EGFR     Digestive   Gastroesophageal reflux disease   Has epigastric pain at times, especially in between meals Likely due to chronic gastritis Advised to try  taking Pepcid as needed Avoid hot and spicy food        Other   Anxiety   Well-controlled currently Referred to psychiatry - had neuropsych evaluation, was not suggstive of ADHD She has not tolerated multiple antidepressants in the past, including Lexapro, Effexor , BuSpar  etc. She prefers to continue relaxation techniques at home for now Xanax  as needed for panic episode, rarely takes it      Relevant Orders   CMP14+EGFR   Encounter for general adult medical examination with abnormal findings - Primary   Physical exam as documented. Fasting blood tests reviewed and discussed with the patient.      Class 1 obesity without serious comorbidity with body  mass index (BMI) of 30.0 to 30.9 in adult   BMI Readings from Last 3 Encounters:  11/21/23 30.41 kg/m  09/19/23 31.62 kg/m  10/02/22 31.62 kg/m   Advised to continue to follow low-carb diet and perform moderate exercise/walking at least 150 minutes/week Has been getting semaglutide via subscription (Ro), but very expensive After discussion, she agrees to switch to Zepbound  via [facility] direct pharmacy - sent Zepbound  2.5 mg QW, plan to increase dose as tolerated      Relevant Medications   tirzepatide  (ZEPBOUND ) 2.5 MG/0.5ML injection vial    Meds ordered this encounter  Medications   tirzepatide  (ZEPBOUND ) 2.5 MG/0.5ML injection vial    Sig: Inject 2.5 mg into the skin every 7 (seven) days.    Dispense:  2 mL    Refill:  0    Follow-up: Return in about 6 months (around 05/23/2024).    Suzzane MARLA Blanch, MD

## 2023-11-21 NOTE — Patient Instructions (Addendum)
 Please start taking Zepbound  2.5 mg once weekly.  Please continue to take medications as prescribed.  Please continue to follow low carb diet and perform moderate exercise/walking at least 150 mins/week.  Please get fasting blood tests done before the next visit.

## 2023-11-21 NOTE — Assessment & Plan Note (Signed)
 BMI Readings from Last 3 Encounters:  11/21/23 30.41 kg/m  09/19/23 31.62 kg/m  10/02/22 31.62 kg/m   Advised to continue to follow low-carb diet and perform moderate exercise/walking at least 150 minutes/week Has been getting semaglutide via subscription (Ro), but very expensive After discussion, she agrees to switch to Zepbound  via [facility] direct pharmacy - sent Zepbound  2.5 mg QW, plan to increase dose as tolerated

## 2023-11-21 NOTE — Assessment & Plan Note (Signed)
 Has epigastric pain at times, especially in between meals Likely due to chronic gastritis Advised to try taking Pepcid as needed Avoid hot and spicy food

## 2023-11-21 NOTE — Assessment & Plan Note (Signed)
Physical exam as documented. ?Fasting blood tests reviewed and discussed with the patient. ?

## 2023-11-21 NOTE — Assessment & Plan Note (Addendum)
 Well-controlled currently Referred to psychiatry - had neuropsych evaluation, was not suggstive of ADHD She has not tolerated multiple antidepressants in the past, including Lexapro, Effexor , BuSpar  etc. She prefers to continue relaxation techniques at home for now Xanax  as needed for panic episode, rarely takes it

## 2023-12-12 ENCOUNTER — Encounter: Payer: Self-pay | Admitting: Internal Medicine

## 2024-03-24 DIAGNOSIS — H524 Presbyopia: Secondary | ICD-10-CM | POA: Diagnosis not present

## 2024-03-24 DIAGNOSIS — H52223 Regular astigmatism, bilateral: Secondary | ICD-10-CM | POA: Diagnosis not present

## 2024-05-26 ENCOUNTER — Ambulatory Visit: Admitting: Internal Medicine
